# Patient Record
Sex: Male | Born: 1990 | Race: Black or African American | Hispanic: No | Smoking: Current every day smoker
Health system: Southern US, Community
[De-identification: ages and names within clinical notes are randomized; demographics above are authoritative.]

---

## 2021-01-18 ENCOUNTER — Encounter (HOSPITAL_COMMUNITY): Payer: Self-pay | Admitting: *Deleted

## 2021-01-18 ENCOUNTER — Other Ambulatory Visit: Payer: Self-pay

## 2021-01-18 ENCOUNTER — Emergency Department (HOSPITAL_COMMUNITY)
Admission: EM | Admit: 2021-01-18 | Discharge: 2021-01-21 | Disposition: A | Discharge status: Other Acute Inpatient Hospital | Payer: Self-pay | Attending: Emergency Medicine | Admitting: Emergency Medicine

## 2021-01-18 ENCOUNTER — Ambulatory Visit (HOSPITAL_COMMUNITY)
Admission: EM | Admit: 2021-01-18 | Discharge: 2021-01-18 | Disposition: A | Discharge status: Other Acute Inpatient Hospital | Payer: No Payment, Other | Attending: Psychiatry | Admitting: Psychiatry

## 2021-01-18 DIAGNOSIS — Z20822 Contact with and (suspected) exposure to covid-19: Secondary | ICD-10-CM | POA: Insufficient documentation

## 2021-01-18 DIAGNOSIS — Z79899 Other long term (current) drug therapy: Secondary | ICD-10-CM | POA: Insufficient documentation

## 2021-01-18 DIAGNOSIS — F1914 Other psychoactive substance abuse with psychoactive substance-induced mood disorder: Secondary | ICD-10-CM | POA: Insufficient documentation

## 2021-01-18 DIAGNOSIS — R45851 Suicidal ideations: Secondary | ICD-10-CM

## 2021-01-18 DIAGNOSIS — R440 Auditory hallucinations: Secondary | ICD-10-CM | POA: Insufficient documentation

## 2021-01-18 DIAGNOSIS — F1910 Other psychoactive substance abuse, uncomplicated: Secondary | ICD-10-CM

## 2021-01-18 DIAGNOSIS — Z62810 Personal history of physical and sexual abuse in childhood: Secondary | ICD-10-CM | POA: Insufficient documentation

## 2021-01-18 DIAGNOSIS — F1099 Alcohol use, unspecified with unspecified alcohol-induced disorder: Secondary | ICD-10-CM | POA: Insufficient documentation

## 2021-01-18 DIAGNOSIS — Z21 Asymptomatic human immunodeficiency virus [HIV] infection status: Secondary | ICD-10-CM | POA: Insufficient documentation

## 2021-01-18 DIAGNOSIS — F1290 Cannabis use, unspecified, uncomplicated: Secondary | ICD-10-CM | POA: Insufficient documentation

## 2021-01-18 DIAGNOSIS — F1994 Other psychoactive substance use, unspecified with psychoactive substance-induced mood disorder: Secondary | ICD-10-CM

## 2021-01-18 DIAGNOSIS — F4310 Post-traumatic stress disorder, unspecified: Secondary | ICD-10-CM | POA: Insufficient documentation

## 2021-01-18 DIAGNOSIS — F17210 Nicotine dependence, cigarettes, uncomplicated: Secondary | ICD-10-CM | POA: Insufficient documentation

## 2021-01-18 DIAGNOSIS — F29 Unspecified psychosis not due to a substance or known physiological condition: Secondary | ICD-10-CM | POA: Insufficient documentation

## 2021-01-18 LAB — LIPID PANEL
Cholesterol: 154 mg/dL (ref 0–200)
HDL: 70 mg/dL (ref >40)
LDL Cholesterol: 69 mg/dL (ref 0–99)
Total CHOL/HDL Ratio: 2.2 RATIO
Triglycerides: 77 mg/dL (ref <150)
VLDL: 15 mg/dL (ref 0–40)

## 2021-01-18 LAB — COMPREHENSIVE METABOLIC PANEL
ALT: 40 U/L (ref 0–44)
ALT: 39 U/L (ref 0–44)
AST: 54 U/L — ABNORMAL HIGH (ref 15–41)
AST: 47 U/L — ABNORMAL HIGH (ref 15–41)
Albumin: 4.2 g/dL (ref 3.5–5.0)
Albumin: 4.1 g/dL (ref 3.5–5.0)
Alkaline Phosphatase: 89 U/L (ref 38–126)
Alkaline Phosphatase: 94 U/L (ref 38–126)
Anion gap: 12 (ref 5–15)
Anion gap: 8 (ref 5–15)
BUN: 13 mg/dL (ref 6–20)
BUN: 13 mg/dL (ref 6–20)
CO2: 25 mmol/L (ref 22–32)
CO2: 28 mmol/L (ref 22–32)
Calcium: 9.5 mg/dL (ref 8.9–10.3)
Calcium: 9.8 mg/dL (ref 8.9–10.3)
Chloride: 99 mmol/L (ref 98–111)
Chloride: 100 mmol/L (ref 98–111)
Creatinine, Ser: 1.18 mg/dL (ref 0.61–1.24)
Creatinine, Ser: 1.24 mg/dL (ref 0.61–1.24)
GFR, Estimated: >60 mL/min (ref >60)
GFR, Estimated: >60 mL/min (ref >60)
Glucose, Bld: 74 mg/dL (ref 70–99)
Glucose, Bld: 109 mg/dL — ABNORMAL HIGH (ref 70–99)
Potassium: 4.1 mmol/L (ref 3.5–5.1)
Potassium: 4.2 mmol/L (ref 3.5–5.1)
Sodium: 136 mmol/L (ref 135–145)
Sodium: 136 mmol/L (ref 135–145)
Total Bilirubin: 1.8 mg/dL — ABNORMAL HIGH (ref 0.3–1.2)
Total Bilirubin: 1.7 mg/dL — ABNORMAL HIGH (ref 0.3–1.2)
Total Protein: 6.8 g/dL (ref 6.5–8.1)
Total Protein: 7.2 g/dL (ref 6.5–8.1)

## 2021-01-18 LAB — CBC WITH DIFFERENTIAL/PLATELET
Abs Immature Granulocytes: 0.04 10*3/uL (ref 0.00–0.07)
Basophils Absolute: 0.1 10*3/uL (ref 0.0–0.1)
Basophils Relative: 1 %
Eosinophils Absolute: 0.3 10*3/uL (ref 0.0–0.5)
Eosinophils Relative: 4 %
HCT: 43.6 % (ref 39.0–52.0)
Hemoglobin: 14.8 g/dL (ref 13.0–17.0)
Immature Granulocytes: 0 %
Lymphocytes Relative: 17 %
Lymphs Abs: 1.6 10*3/uL (ref 0.7–4.0)
MCH: 27.2 pg (ref 26.0–34.0)
MCHC: 33.9 g/dL (ref 30.0–36.0)
MCV: 80 fL (ref 80.0–100.0)
Monocytes Absolute: 0.9 10*3/uL (ref 0.1–1.0)
Monocytes Relative: 11 %
Neutro Abs: 6.1 10*3/uL (ref 1.7–7.7)
Neutrophils Relative %: 67 %
Platelets: 288 10*3/uL (ref 150–400)
RBC: 5.45 MIL/uL (ref 4.22–5.81)
RDW: 13.8 % (ref 11.5–15.5)
WBC: 9 10*3/uL (ref 4.0–10.5)
nRBC: 0 % (ref 0.0–0.2)

## 2021-01-18 LAB — POCT URINE DRUG SCREEN - MANUAL ENTRY (I-SCREEN)
POC Amphetamine UR: NOT DETECTED
POC Buprenorphine (BUP): NOT DETECTED
POC Cocaine UR: POSITIVE — AB
POC Marijuana UR: POSITIVE — AB
POC Methadone UR: NOT DETECTED
POC Methamphetamine UR: NOT DETECTED
POC Morphine: NOT DETECTED
POC Oxazepam (BZO): NOT DETECTED
POC Oxycodone UR: NOT DETECTED
POC Secobarbital (BAR): NOT DETECTED

## 2021-01-18 LAB — CBC
HCT: 44.8 % (ref 39.0–52.0)
Hemoglobin: 14.8 g/dL (ref 13.0–17.0)
MCH: 26.9 pg (ref 26.0–34.0)
MCHC: 33 g/dL (ref 30.0–36.0)
MCV: 81.3 fL (ref 80.0–100.0)
Platelets: 297 10*3/uL (ref 150–400)
RBC: 5.51 MIL/uL (ref 4.22–5.81)
RDW: 13.7 % (ref 11.5–15.5)
WBC: 7.3 10*3/uL (ref 4.0–10.5)
nRBC: 0 % (ref 0.0–0.2)

## 2021-01-18 LAB — URINALYSIS, ROUTINE W REFLEX MICROSCOPIC
Bacteria, UA: NONE SEEN
Bilirubin Urine: NEGATIVE
Glucose, UA: NEGATIVE mg/dL
Ketones, ur: 20 mg/dL — AB
Leukocytes,Ua: NEGATIVE
Nitrite: NEGATIVE
Protein, ur: NEGATIVE mg/dL
Specific Gravity, Urine: 1.016 (ref 1.005–1.030)
pH: 6 (ref 5.0–8.0)

## 2021-01-18 LAB — RAPID URINE DRUG SCREEN, HOSP PERFORMED
Amphetamines: NOT DETECTED
Barbiturates: NOT DETECTED
Benzodiazepines: NOT DETECTED
Cocaine: POSITIVE — AB
Opiates: NOT DETECTED
Tetrahydrocannabinol: POSITIVE — AB

## 2021-01-18 LAB — ETHANOL
Alcohol, Ethyl (B): <10 mg/dL (ref <10)
Alcohol, Ethyl (B): <10 mg/dL (ref <10)

## 2021-01-18 LAB — HEMOGLOBIN A1C
Hgb A1c MFr Bld: 5.7 % — ABNORMAL HIGH (ref 4.8–5.6)
Mean Plasma Glucose: 116.89 mg/dL

## 2021-01-18 LAB — ACETAMINOPHEN LEVEL: Acetaminophen (Tylenol), Serum: <10 ug/mL — ABNORMAL LOW (ref 10–30)

## 2021-01-18 LAB — POC SARS CORONAVIRUS 2 AG -  ED: SARS Coronavirus 2 Ag: NEGATIVE

## 2021-01-18 LAB — RESP PANEL BY RT-PCR (FLU A&B, COVID) ARPGX2
Influenza A by PCR: NEGATIVE
Influenza B by PCR: NEGATIVE
SARS Coronavirus 2 by RT PCR: NEGATIVE

## 2021-01-18 LAB — TSH: TSH: 1.371 u[IU]/mL (ref 0.350–4.500)

## 2021-01-18 LAB — SALICYLATE LEVEL: Salicylate Lvl: <7 mg/dL — ABNORMAL LOW (ref 7.0–30.0)

## 2021-01-18 MED ORDER — MELATONIN 3 MG PO TABS
3.0000 mg | ORAL_TABLET | Freq: Every day | ORAL | Status: DC
  Administered 2021-01-18 – 2021-01-20 (×3): 3 mg via ORAL
  Filled 2021-01-18 (×3): qty 1

## 2021-01-18 MED ORDER — THIAMINE HCL 100 MG/ML IJ SOLN
100.0000 mg | Freq: Every day | INTRAMUSCULAR | Status: DC
Start: 2021-01-18 — End: 2021-01-21

## 2021-01-18 MED ORDER — LORAZEPAM 1 MG PO TABS: 0.0000 mg | ORAL_TABLET | Freq: Two times a day (BID) | ORAL | Status: DC

## 2021-01-18 MED ORDER — LORAZEPAM 2 MG/ML IJ SOLN: 0.0000 mg | Freq: Two times a day (BID) | INTRAMUSCULAR | Status: DC

## 2021-01-18 MED ORDER — ONDANSETRON HCL 4 MG PO TABS: 4.0000 mg | ORAL_TABLET | Freq: Three times a day (TID) | ORAL | Status: DC | PRN

## 2021-01-18 MED ORDER — LORAZEPAM 2 MG/ML IJ SOLN: 0.0000 mg | Freq: Four times a day (QID) | INTRAMUSCULAR | Status: AC

## 2021-01-18 MED ORDER — LORAZEPAM 1 MG PO TABS: 0.0000 mg | ORAL_TABLET | Freq: Four times a day (QID) | ORAL | Status: AC

## 2021-01-18 MED ORDER — ACETAMINOPHEN 325 MG PO TABS
650.0000 mg | ORAL_TABLET | ORAL | Status: DC | PRN
  Administered 2021-01-20: 650 mg via ORAL
  Filled 2021-01-18: qty 2

## 2021-01-18 MED ORDER — BICTEGRAVIR-EMTRICITAB-TENOFOV 50-200-25 MG PO TABS
1.0000 | ORAL_TABLET | Freq: Every day | ORAL | Status: DC
  Administered 2021-01-18 – 2021-01-20 (×3): 1 via ORAL
  Filled 2021-01-18 (×5): qty 1

## 2021-01-18 MED ORDER — THIAMINE HCL 100 MG PO TABS
100.0000 mg | ORAL_TABLET | Freq: Every day | ORAL | Status: DC
  Administered 2021-01-18 – 2021-01-20 (×3): 100 mg via ORAL
  Filled 2021-01-18 (×3): qty 1

## 2021-01-18 MED ORDER — NICOTINE 21 MG/24HR TD PT24
21.0000 mg | MEDICATED_PATCH | Freq: Every day | TRANSDERMAL | Status: DC
  Filled 2021-01-18: qty 1

## 2021-01-18 MED ORDER — ALUM & MAG HYDROXIDE-SIMETH 200-200-20 MG/5ML PO SUSP: 30.0000 mL | Freq: Four times a day (QID) | ORAL | Status: DC | PRN

## 2021-01-18 NOTE — Progress Notes (Signed)
CSW contacted [facility], and [facility]. All facilities mentioned currently have no available beds.   Crissie Reese, MSW, LCSW-A, LCAS-A Phone: [phone] Disposition/TOC

## 2021-01-18 NOTE — Progress Notes (Signed)
Patient resting quietly. RR even and unlabored.  Continue to monitor for safety.
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
## 2021-01-18 NOTE — Discharge Instructions (Signed)
Transfer to [facility] health ED for medical clearance.

## 2021-01-18 NOTE — ED Triage Notes (Signed)
The pt reports that he has difficulty sleeping he is afraid to go to sleep  he has nightmares  as he has answered myquestions he does not make  eye contact with me speaks in a low voice  mumbling and has been asked to repeat himself numerus times
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
## 2021-01-18 NOTE — ED Provider Notes (Signed)
[facility] EMERGENCY DEPARTMENT Provider Note   CSN: [id number] Arrival date & time: 01/18/21  1449     History Chief Complaint  Patient presents with   Psychiatric Evaluation    Howard Rowland is a 30 y.o. male.  29 year old male transferred to the ER from behavioral health pending placement at inpatient facility.  Patient denies complaints at this time, requests his Biktarvy dose, states that he has skipped it for the past 3 or 4 days.  Reports he was forced to drink have amounts of alcohol and used cocaine, see prior psych note from earlier today.  Denies SI or HI at this time.      History reviewed. No pertinent past medical history.  There are no problems to display for this patient.   History reviewed. No pertinent surgical history.     No family history on file.  Social History   Tobacco Use   Smoking status: Every Day    Types: Cigarettes   Smokeless tobacco: Never  Substance Use Topics   Alcohol use: Yes   Drug use: Yes    Types: Cocaine    Home Medications Prior to Admission medications   Medication Sig Start Date End Date Taking? Authorizing Provider  bictegravir-emtricitabine-tenofovir AF (BIKTARVY) 50-200-25 MG TABS tablet Take 1 tablet by mouth daily. 11/28/20  Yes [provider]  mirtazapine (REMERON) 45 MG tablet Take 45 mg by mouth at bedtime. 01/15/21  Yes [provider]  pregabalin (LYRICA) 75 MG capsule Take 75 capsules by mouth in the morning and at bedtime. 08/16/20  Yes [provider]    Allergies    Gabapentin, Haldol [haloperidol], Naloxone, Prednisone, Suboxone [buprenorphine hcl-naloxone hcl], Lurasidone, and Ziprasidone  Review of Systems   Review of Systems  Constitutional:  Negative for chills and fever.  Respiratory:  Negative for shortness of breath.   Cardiovascular:  Negative for chest pain.  Gastrointestinal:  Negative for abdominal pain, nausea and vomiting.  Musculoskeletal:   Negative for arthralgias and myalgias.  Skin:  Negative for rash and wound.  Allergic/Immunologic: Negative for immunocompromised state.  Neurological:  Negative for weakness.  Psychiatric/Behavioral:  Positive for sleep disturbance. Negative for agitation, hallucinations and suicidal ideas.   All other systems reviewed and are negative.  Physical Exam Updated Vital Signs BP 124/83 (BP Location: Left Arm)   Pulse 77   Temp 98.6 F (37 C) (Oral)   Resp 16   Ht 5\' 8"  (1.727 m)   Wt 65.8 kg   SpO2 96%   BMI 22.05 kg/m   Physical Exam Vitals and nursing note reviewed.  Constitutional:      General: He is not in acute distress.    Appearance: He is well-developed. He is not diaphoretic.  HENT:     Head: Normocephalic and atraumatic.  Eyes:     Conjunctiva/sclera: Conjunctivae normal.  Cardiovascular:     Pulses: Normal pulses.     Heart sounds: Normal heart sounds.  Pulmonary:     Effort: Pulmonary effort is normal.     Breath sounds: Normal breath sounds.  Abdominal:     Palpations: Abdomen is soft.     Tenderness: There is no abdominal tenderness.  Musculoskeletal:     Cervical back: Neck supple.     Right lower leg: No edema.     Left lower leg: No edema.  Skin:    General: Skin is warm and dry.     Findings: No erythema or rash.  Neurological:  Mental Status: He is alert and oriented to person, place, and time.  Psychiatric:        Behavior: Behavior normal.    ED Results / Procedures / Treatments   Labs (all labs ordered are listed, but only abnormal results are displayed) Labs Reviewed  COMPREHENSIVE METABOLIC PANEL - Abnormal; Notable for the following components:      Result Value   Glucose, Bld 109 (*)    AST 47 (*)    Total Bilirubin 1.7 (*)    All other components within normal limits  SALICYLATE LEVEL - Abnormal; Notable for the following components:   Salicylate Lvl <7.0 (*)    All other components within normal limits  ACETAMINOPHEN LEVEL -  Abnormal; Notable for the following components:   Acetaminophen (Tylenol), Serum <10 (*)    All other components within normal limits  RAPID URINE DRUG SCREEN, HOSP PERFORMED - Abnormal; Notable for the following components:   Cocaine POSITIVE (*)    Tetrahydrocannabinol POSITIVE (*)    All other components within normal limits  ETHANOL  CBC    EKG None  Radiology No results found.  Procedures Procedures   Medications Ordered in ED Medications  LORazepam (ATIVAN) injection 0-4 mg (0 mg Intravenous Not Given 01/18/21 1856)    Or  LORazepam (ATIVAN) tablet 0-4 mg ( Oral See Alternative 01/18/21 1856)  LORazepam (ATIVAN) injection 0-4 mg (has no administration in time range)    Or  LORazepam (ATIVAN) tablet 0-4 mg (has no administration in time range)  thiamine tablet 100 mg (100 mg Oral Given 01/18/21 1856)    Or  thiamine (B-1) injection 100 mg ( Intravenous See Alternative 01/18/21 1856)  acetaminophen (TYLENOL) tablet 650 mg (has no administration in time range)  ondansetron (ZOFRAN) tablet 4 mg (has no administration in time range)  alum & mag hydroxide-simeth (MAALOX/MYLANTA) 200-200-20 MG/5ML suspension 30 mL (has no administration in time range)  nicotine (NICODERM CQ - dosed in mg/24 hours) patch 21 mg (21 mg Transdermal Not Given 01/18/21 1846)  melatonin tablet 3 mg (has no administration in time range)  bictegravir-emtricitabine-tenofovir AF (BIKTARVY) 50-200-25 MG per tablet 1 tablet (1 tablet Oral Given 01/18/21 1857)    ED Course  I have reviewed the triage vital signs and the nursing notes.  Pertinent labs & imaging results that were available during my care of the patient were reviewed by me and considered in my medical decision making (see chart for details).  Clinical Course as of 01/18/21 2127  Sat Jan 18, 2021  6724 30 year old male sent from behavioral health urgent care pending placement per their recommendation.  Patient is medically cleared.  Home  medication including Biktarvy ordered.  Discussed plan with patient who verbalizes understanding.  COVID obtained at behavioral urgent care earlier today and is negative.  CIWA ordered due to report of heavy alcohol use for the past few days however patient has not needed Ativan. [LM]    Clinical Course User Index [LM] Howard Rowland   MDM Rules/Calculators/A&P                            Final Clinical Impression(s) / ED Diagnoses Final diagnoses:  None    Rx / DC Orders ED Discharge Orders     None        Howard Rowland 01/18/21 2127    Wynetta Fines, MD 01/20/21 2251

## 2021-01-18 NOTE — ED Notes (Signed)
LOCKER #[id number] 

## 2021-01-18 NOTE — Progress Notes (Signed)
Patient ambulated independently to sally port without issue.  Discharged with safe transport to [facility] ED in stable condition; no acute distress noted.

## 2021-01-18 NOTE — ED Notes (Signed)
Given salad, crackers and 2 apple juice.

## 2021-01-18 NOTE — ED Notes (Signed)
Pt has been wanded by security. 

## 2021-01-18 NOTE — ED Notes (Signed)
Pt states that he takes 45mg  mirtazapine and 75mg  pregabalin qhs.

## 2021-01-18 NOTE — ED Notes (Signed)
Report called to Asher Muir, RN [facility].  Safe transport called.  Patient given ham and cheese sandwich and 2 apple juice.

## 2021-01-18 NOTE — Progress Notes (Signed)
Patient cooperative with provider ordered work up.  Rapid COVID negative.  Skin check completed and belongings secured.  Patient endorsed SI with a plan to walk into traffic.  Stated he had no plan or intent here.  Stated he didn't want to talk about HI.  Stated he has heard voices in the past, but none now.  Denied VH.  Patient ambulated independently to the adult unit.  Oriented to unit and offered snack and water.

## 2021-01-18 NOTE — ED Notes (Signed)
Last cocaine and alcohol yesterday  Snorted cocaine

## 2021-01-18 NOTE — Progress Notes (Signed)
Per Dr. Hazle Quant, patient meets criteria for inpatient treatment. There are no available or appropriate beds at [facility] today. CSW faxed referrals to the following facilities for review:  [facility]  Pending - No Request Sent N/A [address] [place] [place] [address] [phone] --  [facility]  Pending - No Request Sent N/A [address] [place] [address] [phone] --  [facility]  Pending - No Request Sent N/A [address] [place] [address] [phone] --  [facility]  Pending - No Request Sent N/A [address] [place] [address] [phone] --  [facility]  Pending - No Request Sent N/A [address] [place], [place] [address] [phone] --  [facility]  Pending - No Request Sent N/A [address] [place] [address] [phone] --  [facility]  Pending - No Request Sent N/A [address] [place] [address] [phone] --  [facility]  Pending - No Request Sent N/A [address] [place] [address] [phone] --  [facility]  Pending - No Request Sent N/A [address], [place] [address] [phone] --  [facility]  Pending - No Request Sent N/A [address] [place] [address] [phone] --  [facility]  Pending - No Request Sent N/A [address] [place] [address] [phone] --  [facility]  Pending - No Request Sent N/A [address] [place] [address] [phone] --  [facility]  Pending - No Request Sent N/A [address], [place] [address] [phone] --  [facility]  Pending - No Request Sent N/A [address] [place] [address] [phone] --  [facility]  Pending - No Request Sent N/A [address] [place] [address] [phone] --  [facility]  Pending - No Request Sent N/A [address] [place], [place] [address] [phone] --  [facility]  Pending - No Request Sent N/A [address], [place] [address] [phone] --  [facility]  Pending - No Request Sent N/A [address] [place] [address] [phone]     TTS will continue to seek bed placement.  Crissie Reese, MSW, LCSW-A, LCAS-A Phone: [phone] Disposition/TOC

## 2021-01-18 NOTE — ED Triage Notes (Signed)
Regular clothes removed and placed into burgundy scrubs wanded. One huge bag one smaller back pack 2 other plastic bags with personal items not  listed or inventoried here in triage.  Items placed in front and inside locker number 5 in purple

## 2021-01-18 NOTE — Progress Notes (Signed)
Patient resting quietly.  Turning self intermittently. No distress noted.  Continue to monitor for safety.

## 2021-01-19 NOTE — ED Notes (Signed)
Breakfast Ordered 

## 2021-01-20 ENCOUNTER — Encounter (HOSPITAL_COMMUNITY): Payer: Self-pay | Admitting: Registered Nurse

## 2021-01-20 MED ORDER — PREGABALIN 75 MG PO CAPS
75.0000 mg | ORAL_CAPSULE | Freq: Two times a day (BID) | ORAL | Status: DC
  Administered 2021-01-20 (×3): 75 mg via ORAL
  Filled 2021-01-20 (×3): qty 3

## 2021-01-20 MED ORDER — MIRTAZAPINE 15 MG PO TABS
45.0000 mg | ORAL_TABLET | Freq: Every day | ORAL | Status: DC
  Administered 2021-01-20: 45 mg via ORAL
  Filled 2021-01-20 (×2): qty 3

## 2021-01-20 NOTE — ED Notes (Signed)
Pt resting in bed. Ate dinner tray. Denies needs.

## 2021-01-20 NOTE — ED Notes (Signed)
Pt resting in bed watching tv. Says he takes his lyrica now instead of late at night. I rescheduled it, waiting for pharmacy. Pt denies other needs.

## 2021-01-20 NOTE — ED Notes (Signed)
Pt appears to be asleep. NAD noted. Sitter remains. 

## 2021-01-20 NOTE — ED Notes (Signed)
Pt ambulatory to restroom. Denies further needs.

## 2021-01-20 NOTE — ED Notes (Addendum)
Howard Rowland messaged me and said that pt was assessed on the 20th and his information has been sent out so they're just waiting to hear back whether he was accepted anywhere.

## 2021-01-20 NOTE — ED Notes (Signed)
Remeron was not originally part of ordered meds. But is part of daily meds outpatient. It was  added by EDP when adding Lyrica tonight at the request of the pt.  It is scheduled as bedtime med but pt had concerns about taking it this late because it makes him hungry.  We discussed scheduling it for 2100 on 8/22 so it is close to bedtime but also at snack time to help with hunger.  He agreed.  This way it can still be given with other nighttime meds within the hour window. It is currently scheduled for 2100 today 8/22.

## 2021-01-20 NOTE — ED Notes (Signed)
Pt given a bag lunch/cheese and crackers.

## 2021-01-20 NOTE — ED Notes (Signed)
Pt resting in bed comfortably .  Sitter at bedside.  

## 2021-01-20 NOTE — ED Notes (Signed)
Pt was accepted to [facility] pending discharges in the morning. Social work will contact us again once more information is provided.

## 2021-01-20 NOTE — BH Assessment (Addendum)
Notified @2335 , Per [facility] patient accepted to [facility], Room # 407-2 pending discharges in the morning. Also, new COVID results and must show negative prior to [facility] transfer. Patient's nurse [facility], RN), provided disposition updates.

## 2021-01-20 NOTE — ED Notes (Signed)
Pt resting in bed. NAD noted. Sitter remains.

## 2021-01-20 NOTE — BH Assessment (Addendum)
Disposition:   Shuvon Rankin, NP, requested patient to be reviewed for [facility], homeless [place] polys[place], suicidal/homicidal. However, no [facility] bed availability per Vernard Gambles, NP. @2210 , Clinician reached out to [facility]) to notify of patient's bed needs. Patient also faxed out to multiple hospitals for consideration of bed placement.   Destination Service Provider Selected Services Address Phone [facility]  N/A [address] [place], [place] [phone]  [facility]  N/A [phone] [place] [phone]  [facility]  N/A [address], [place] [address] [place] [phone]  [facility]  N/A [address] [place] [phone]  [facility]  N/A [address] [place] [phone]  [facility]  N/A [address], [place] [phone]  [facility]  N/A [address] [place] [phone]  [facility]  N/A [address], [place] [address] [place] [phone]  [facility]  N/A [address], [place] [phone]  [facility]  N/A [address] [place] [phone]

## 2021-01-20 NOTE — ED Notes (Signed)
Sleeping, arousable to voice, meds given, denies needs, sitter present.

## 2021-01-20 NOTE — ED Notes (Signed)
Sleeping

## 2021-01-20 NOTE — ED Notes (Signed)
Pt sitting up in bed now. Introduced myself. Pt AxO x4. GCS 15. Given [place] bag and sprite. Denies further needs.

## 2021-01-20 NOTE — ED Notes (Signed)
Order in for TTS of pt.

## 2021-01-20 NOTE — ED Notes (Signed)
PT has been resting quietly in room since I arrived at 1900. He declined to take Melatonin b/c he was watching a movie. He is still resting quielty.

## 2021-01-20 NOTE — ED Notes (Signed)
Pt appears to be asleep. Sitter remains. NAD noted. 

## 2021-01-20 NOTE — ED Notes (Signed)
Went to introduce myself to pt. Pt asleep. NAD noted. Sitter at bedside.

## 2021-01-21 ENCOUNTER — Other Ambulatory Visit: Payer: Self-pay

## 2021-01-21 ENCOUNTER — Inpatient Hospital Stay (HOSPITAL_COMMUNITY)
Admission: AD | Admit: 2021-01-21 | Discharge: 2021-01-27 | DRG: 885 | Disposition: A | Discharge status: Home / Self Care | Payer: Federal, State, Local not specified - Other | Attending: Emergency Medicine | Admitting: Emergency Medicine

## 2021-01-21 ENCOUNTER — Encounter (HOSPITAL_COMMUNITY): Payer: Self-pay | Admitting: Emergency Medicine

## 2021-01-21 DIAGNOSIS — F1410 Cocaine abuse, uncomplicated: Secondary | ICD-10-CM | POA: Diagnosis present

## 2021-01-21 DIAGNOSIS — R45851 Suicidal ideations: Secondary | ICD-10-CM | POA: Diagnosis present

## 2021-01-21 DIAGNOSIS — Z888 Allergy status to other drugs, medicaments and biological substances status: Secondary | ICD-10-CM | POA: Diagnosis not present

## 2021-01-21 DIAGNOSIS — F1911 Other psychoactive substance abuse, in remission: Secondary | ICD-10-CM | POA: Diagnosis present

## 2021-01-21 DIAGNOSIS — F1210 Cannabis abuse, uncomplicated: Secondary | ICD-10-CM | POA: Diagnosis present

## 2021-01-21 DIAGNOSIS — Z20822 Contact with and (suspected) exposure to covid-19: Secondary | ICD-10-CM | POA: Diagnosis present

## 2021-01-21 DIAGNOSIS — Z7289 Other problems related to lifestyle: Secondary | ICD-10-CM | POA: Diagnosis not present

## 2021-01-21 DIAGNOSIS — Z5900 Homelessness unspecified: Secondary | ICD-10-CM

## 2021-01-21 DIAGNOSIS — G4700 Insomnia, unspecified: Secondary | ICD-10-CM | POA: Diagnosis present

## 2021-01-21 DIAGNOSIS — F17210 Nicotine dependence, cigarettes, uncomplicated: Secondary | ICD-10-CM | POA: Diagnosis present

## 2021-01-21 DIAGNOSIS — F1011 Alcohol abuse, in remission: Secondary | ICD-10-CM | POA: Diagnosis present

## 2021-01-21 DIAGNOSIS — F4310 Post-traumatic stress disorder, unspecified: Secondary | ICD-10-CM | POA: Diagnosis present

## 2021-01-21 DIAGNOSIS — F333 Major depressive disorder, recurrent, severe with psychotic symptoms: Secondary | ICD-10-CM | POA: Diagnosis present

## 2021-01-21 DIAGNOSIS — Z635 Disruption of family by separation and divorce: Secondary | ICD-10-CM | POA: Diagnosis not present

## 2021-01-21 DIAGNOSIS — B20 Human immunodeficiency virus [HIV] disease: Secondary | ICD-10-CM | POA: Diagnosis present

## 2021-01-21 DIAGNOSIS — Z62810 Personal history of physical and sexual abuse in childhood: Secondary | ICD-10-CM | POA: Diagnosis present

## 2021-01-21 DIAGNOSIS — F17200 Nicotine dependence, unspecified, uncomplicated: Secondary | ICD-10-CM

## 2021-01-21 LAB — RESP PANEL BY RT-PCR (FLU A&B, COVID) ARPGX2
Influenza A by PCR: NEGATIVE
Influenza B by PCR: NEGATIVE
SARS Coronavirus 2 by RT PCR: NEGATIVE

## 2021-01-21 MED ORDER — ACETAMINOPHEN 325 MG PO TABS: 650.0000 mg | ORAL_TABLET | Freq: Four times a day (QID) | ORAL | Status: DC | PRN

## 2021-01-21 MED ORDER — ADULT MULTIVITAMIN W/MINERALS CH
1.0000 | ORAL_TABLET | Freq: Every day | ORAL | Status: DC
  Administered 2021-01-21 – 2021-01-26 (×6): 1 via ORAL
  Filled 2021-01-21 (×9): qty 1

## 2021-01-21 MED ORDER — PREGABALIN 75 MG PO CAPS
75.0000 mg | ORAL_CAPSULE | Freq: Two times a day (BID) | ORAL | Status: DC
  Administered 2021-01-21 – 2021-01-24 (×6): 75 mg via ORAL
  Filled 2021-01-21 (×6): qty 1

## 2021-01-21 MED ORDER — HYDROXYZINE HCL 25 MG PO TABS
25.0000 mg | ORAL_TABLET | Freq: Four times a day (QID) | ORAL | Status: AC | PRN
Start: 2021-01-21 — End: 2021-01-24
  Filled 2021-01-21: qty 1

## 2021-01-21 MED ORDER — BICTEGRAVIR-EMTRICITAB-TENOFOV 50-200-25 MG PO TABS
1.0000 | ORAL_TABLET | Freq: Every day | ORAL | Status: DC
  Administered 2021-01-21 – 2021-01-27 (×7): 1 via ORAL
  Filled 2021-01-21 (×10): qty 1

## 2021-01-21 MED ORDER — ALUM & MAG HYDROXIDE-SIMETH 200-200-20 MG/5ML PO SUSP: 30.0000 mL | ORAL | Status: DC | PRN

## 2021-01-21 MED ORDER — MIRTAZAPINE 15 MG PO TABS
45.0000 mg | ORAL_TABLET | Freq: Every day | ORAL | Status: DC
  Filled 2021-01-21 (×2): qty 3

## 2021-01-21 MED ORDER — DIPHENHYDRAMINE HCL 25 MG PO CAPS: 25.0000 mg | ORAL_CAPSULE | Freq: Every evening | ORAL | Status: DC | PRN

## 2021-01-21 MED ORDER — ONDANSETRON 4 MG PO TBDP: 4.0000 mg | ORAL_TABLET | Freq: Four times a day (QID) | ORAL | Status: AC | PRN

## 2021-01-21 MED ORDER — LOPERAMIDE HCL 2 MG PO CAPS: 2.0000 mg | ORAL_CAPSULE | ORAL | Status: AC | PRN

## 2021-01-21 MED ORDER — THIAMINE HCL 100 MG PO TABS
100.0000 mg | ORAL_TABLET | Freq: Every day | ORAL | Status: DC
  Administered 2021-01-22 – 2021-01-27 (×7): 100 mg via ORAL
  Filled 2021-01-21 (×10): qty 1

## 2021-01-21 MED ORDER — LORAZEPAM 1 MG PO TABS
1.0000 mg | ORAL_TABLET | Freq: Four times a day (QID) | ORAL | Status: AC | PRN
Start: 2021-01-21 — End: 2021-01-24

## 2021-01-21 MED ORDER — MAGNESIUM HYDROXIDE 400 MG/5ML PO SUSP: 30.0000 mL | Freq: Every day | ORAL | Status: DC | PRN

## 2021-01-21 MED ORDER — PREGABALIN 75 MG PO CAPS
75.0000 mg | ORAL_CAPSULE | Freq: Once | ORAL | Status: AC
  Administered 2021-01-21: 75 mg via ORAL
  Filled 2021-01-21: qty 1

## 2021-01-21 MED ORDER — MIRTAZAPINE 45 MG PO TABS
45.0000 mg | ORAL_TABLET | ORAL | Status: DC
  Administered 2021-01-22: 45 mg via ORAL
  Filled 2021-01-21 (×3): qty 1

## 2021-01-21 MED ORDER — PREGABALIN 75 MG PO CAPS: 75.0000 mg | ORAL_CAPSULE | Freq: Two times a day (BID) | ORAL | Status: DC

## 2021-01-21 MED ORDER — TRAZODONE HCL 50 MG PO TABS: 50.0000 mg | ORAL_TABLET | Freq: Every evening | ORAL | Status: DC | PRN

## 2021-01-21 NOTE — ED Notes (Signed)
Pt appears to be asleep. NAD noted. Sitter remains. 

## 2021-01-21 NOTE — ED Notes (Signed)
Pt appears to be asleep. NAD noted. 
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
## 2021-01-21 NOTE — ED Notes (Signed)
Pt continues to sleep.

## 2021-01-21 NOTE — [facility] Counselor (Signed)
[facility] LCSW Note  01/21/2021   2:25PM  Type of Contact and Topic:  PSA Attempt  CSW made efforts to complete PSA w/ pt. Pt requested PSA be completed tomorrow due to currently sleeping. CSW will make continued efforts to complete PSA.    Leisa Lenz, LCSW 01/21/2021  2:31 PM

## 2021-01-21 NOTE — Progress Notes (Signed)
Adult Psychoeducational Group Note  Date:  01/21/2021 Time:  10:11 PM  Group Topic/Focus:  Wrap-Up Group:   The focus of this group is to help patients review their daily goal of treatment and discuss progress on daily workbooks.  Participation Level:  Active  Participation Quality:  Appropriate and Attentive  Affect:  Appropriate  Cognitive:  Alert and Appropriate  Insight: Appropriate and Good  Engagement in Group:  Engaged  Modes of Intervention:  Discussion  Additional Comments: Review patient's goals, progress, and objectives on this date and examine group skills pertaining to treatment. Discuss symptoms, supportive counseling, identification, and exploration of emotions. In addition, we discussed substituting negative/maladaptive thoughts or images with positive/adaptive ones. Pt self-reporting has an overall day of 7.5 out of 10 on this date. End of Wrap-Up Group progress note.   Nicoletta Dress 01/21/2021, 10:11 PM
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
## 2021-01-21 NOTE — Progress Notes (Addendum)
   01/21/21 2100  Psych Admission Type (Psych Patients Only)  Admission Status Voluntary  Psychosocial Assessment  Patient Complaints Anxiety;Depression  Eye Contact Brief  Facial Expression Anxious  Affect Anxious;Blunted  Speech Logical/coherent  Interaction Assertive  Motor Activity Other (Comment) (wnl)  Appearance/Hygiene Unremarkable  Behavior Characteristics Cooperative;Anxious  Mood Anxious;Depressed;Pleasant  Thought Process  Coherency WDL  Content WDL  Delusions None reported or observed  Perception WDL  Hallucination None reported or observed  Judgment WDL  Confusion None  Danger to Self  Current suicidal ideation? Denies  Danger to Others  Danger to Others None reported or observed   Pt seen in dayroom during evening group. Pt denies SI, HI, AVH and pain. Pt rates anxiety 9/10 and depression 5/10. Pt stays superficial in his comments. Been seen interacting with other peers. No behavior issues. Takes meds as prescribed. Denies any withdrawal symptoms.

## 2021-01-21 NOTE — ED Provider Notes (Signed)
Patient has been accepted to [facility] by Dr. Maxine Glenn, Shawna Orleans, MD 01/21/21 [phone]

## 2021-01-22 DIAGNOSIS — F1210 Cannabis abuse, uncomplicated: Secondary | ICD-10-CM | POA: Diagnosis not present

## 2021-01-22 DIAGNOSIS — F1410 Cocaine abuse, uncomplicated: Secondary | ICD-10-CM | POA: Diagnosis not present

## 2021-01-22 DIAGNOSIS — F4310 Post-traumatic stress disorder, unspecified: Secondary | ICD-10-CM | POA: Diagnosis not present

## 2021-01-22 DIAGNOSIS — F333 Major depressive disorder, recurrent, severe with psychotic symptoms: Secondary | ICD-10-CM | POA: Diagnosis not present

## 2021-01-22 MED ORDER — MIRTAZAPINE 45 MG PO TABS
45.0000 mg | ORAL_TABLET | Freq: Every day | ORAL | Status: DC
  Administered 2021-01-22: 45 mg via ORAL
  Filled 2021-01-22 (×3): qty 1

## 2021-01-22 NOTE — Progress Notes (Signed)
  D: Patient is alert and oriented x 4. Patient denies SI/HI/ AVH and pain. Disposition is Calm and cooperative with appropriate affect. Verbally contracts for safety to this Clinical research associate.   A:  Pt was given scheduled medications. Pt was encourage to attend groups. Q 15 minute checks were done for safety. Pt was visualized interacting appropriately with others in the milieu. Pt was offered support and encouragement by this Clinical research associate. Pt is goal oriented and stated goal was reached for the day. Pt attended groups and interacts well with peers and staff. Pt is taking medication. Pt complied with scheduled medications. No signs of distress nor concerns reported by patient at present.Pt remains receptive to treatment and safety maintained on unit.   R: Will continue to monitor and assess. Safety maintained during this shift.      01/22/21 2350  Psych Admission Type (Psych Patients Only)  Admission Status Voluntary  Psychosocial Assessment  Patient Complaints Anxiety  Eye Contact Brief  Facial Expression Anxious  Affect Anxious;Blunted  Speech Logical/coherent  Interaction Assertive  Appearance/Hygiene Unremarkable  Behavior Characteristics Cooperative  Mood Pleasant  Thought Process  Coherency WDL  Content WDL  Delusions None reported or observed  Perception WDL  Hallucination None reported or observed  Judgment WDL  Confusion None  Danger to Self  Current suicidal ideation? Denies  Danger to Others  Danger to Others None reported or observed

## 2021-01-22 NOTE — Progress Notes (Signed)
Adult Psychoeducational Group Note  Date:  01/22/2021 Time:  9:55 PM  Group Topic/Focus:  Wrap-Up Group:  The focus of this group is to help patients review their daily goal of treatment and discuss progress on daily workbooks.  Participation Level:  Active  Participation Quality:  Appropriate  Affect:  Appropriate  Cognitive:  Appropriate  Insight: Appropriate  Engagement in Group:  Engaged  Modes of Intervention:  Discussion  Additional Comments: The patient participated in NA Group on this date with volunteers on how to decrease dependence on relationships while beginning to meet their own needs, build confidence, and practice assertiveness. Volunteers facilitated a discussion with the group regarding triggers and identified emotional and situational factors that affect their desire to use--the end of the Wrap-Up progress note for the NA support group.       Nicoletta Dress 01/22/2021, 9:55 PM

## 2021-01-22 NOTE — Progress Notes (Signed)
Dar Note: Patient presents with a blunted affect and pleasant mood.  Denies suicidal thoughts, auditory and visual hallucinations.  Medications given as prescribed.  Routine safety checks maintained.  Patient visible in milieu interacting with staff and peers.  Patient is safe on and off the unit.
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
## 2021-01-22 NOTE — Progress Notes (Signed)
Recreation Therapy Notes  Date:  8.24.22 Time: 0930 Location: [address] Dayroom  Group Topic: Stress Management  Goal Area(s) Addresses:  Patient will identify positive stress management techniques. Patient will identify benefits of using stress management post d/c.  Behavioral Response: Attentive  Intervention: Stress Management  Activity :  Meditation.  LRT played a meditation that focused on being resilient in the face of adversity and managing reactions to situations beyond your control.  Education:  Stress Management, Discharge Planning.   Education Outcome: Acknowledges Education  Clinical Observations/Feedback: Despite interruptions, pt was engaged in group.     Caroll Rancher, LRT/CTRS         Caroll Rancher A 01/22/2021 12:20 PM

## 2021-01-22 NOTE — [facility] Group Notes (Signed)
[facility] Group Notes:  (Nursing/MHT/Case Management/Adjunct)  Date:  01/22/2021  Time:  10:36 AM  Type of Therapy:  Pts were asked to talk about their daily and long term goals   Participation Level:  Active  Participation Quality:  Attentive  Affect:  Appropriate  Cognitive:  Appropriate  Insight:  Good  Engagement in Group:  Engaged  Modes of Intervention:  Clarification  Summary of Progress/Problems: Pt stated their daily goal is to try and get his Ids brought in. Long term he would like to learn more languages. Ames Coupe 01/22/2021, 10:36 AM

## 2021-01-22 NOTE — Tx Team (Signed)
Interdisciplinary Treatment and Diagnostic Plan Update  01/22/2021 Time of Session: 9:40am  Howard Rowland MRN: [id number]  Principal Diagnosis: MDD (major depressive disorder), recurrent, severe, with psychosis ([place])  Secondary Diagnoses: Principal Problem:   MDD (major depressive disorder), recurrent, severe, with psychosis ([place]) Active Problems:   Cocaine abuse ([place])   Nondependent alcohol abuse, in remission   Tobacco use disorder   Post traumatic stress disorder (PTSD)   Marijuana abuse   Current Medications:  Current Facility-Administered Medications  Medication Dose Route Frequency Provider Last Rate Last Admin   acetaminophen (TYLENOL) tablet 650 mg  650 mg Oral Q6H PRN Prescilla Sours, PA-C       alum & mag hydroxide-simeth (MAALOX/MYLANTA) 200-200-20 MG/5ML suspension 30 mL  30 mL Oral Q4H PRN Prescilla Sours, PA-C       bictegravir-emtricitabine-tenofovir AF (BIKTARVY) 50-200-25 MG per tablet 1 tablet  1 tablet Oral Daily Margorie John W, PA-C   1 tablet at 01/22/21 3825   diphenhydrAMINE (BENADRYL) capsule 25 mg  25 mg Oral QHS PRN,MR X 1 Lavella Hammock, MD       hydrOXYzine (ATARAX/VISTARIL) tablet 25 mg  25 mg Oral Q6H PRN Margorie John W, PA-C       loperamide (IMODIUM) capsule 2-4 mg  2-4 mg Oral PRN Margorie John W, PA-C       LORazepam (ATIVAN) tablet 1 mg  1 mg Oral Q6H PRN Margorie John W, PA-C       magnesium hydroxide (MILK OF MAGNESIA) suspension 30 mL  30 mL Oral Daily PRN Margorie John W, PA-C       mirtazapine (REMERON) tablet 45 mg  45 mg Oral Doroteo Bradford, MD   45 mg at 01/22/21 0539   multivitamin with minerals tablet 1 tablet  1 tablet Oral Daily Prescilla Sours, PA-C   1 tablet at 01/21/21 1316   ondansetron (ZOFRAN-ODT) disintegrating tablet 4 mg  4 mg Oral Q6H PRN Margorie John W, PA-C       pregabalin (LYRICA) capsule 75 mg  75 mg Oral BID Singleton, Amy E, MD   75 mg at 01/22/21 7673   thiamine tablet 100 mg  100 mg Oral Daily Margorie John W, PA-C    100 mg at 01/22/21 4193   PTA Medications: Medications Prior to Admission  Medication Sig Dispense Refill Last Dose   bictegravir-emtricitabine-tenofovir AF (BIKTARVY) 50-200-25 MG TABS tablet Take 1 tablet by mouth daily.      mirtazapine (REMERON) 45 MG tablet Take 45 mg by mouth at bedtime.      pregabalin (LYRICA) 75 MG capsule Take 75 capsules by mouth in the morning and at bedtime.       Patient Stressors:    Patient Strengths:    Treatment Modalities: Medication Management, Group therapy, Case management,  1 to 1 session with clinician, Psychoeducation, Recreational therapy.   Physician Treatment Plan for Primary Diagnosis: MDD (major depressive disorder), recurrent, severe, with psychosis ([place]) Long Term Goal(s): Improvement in symptoms so as ready for discharge   Short Term Goals: Ability to identify changes in lifestyle to reduce recurrence of condition will improve Ability to verbalize feelings will improve Ability to disclose and discuss suicidal ideas Ability to demonstrate self-control will improve Ability to identify and develop effective coping behaviors will improve Ability to maintain clinical measurements within normal limits will improve Ability to identify triggers associated with substance abuse/mental health issues will improve  Medication Management: Evaluate patient's response, side effects, and tolerance  of medication regimen.  Therapeutic Interventions: 1 to 1 sessions, Unit Group sessions and Medication administration.  Evaluation of Outcomes: Not Met  Physician Treatment Plan for Secondary Diagnosis: Principal Problem:   MDD (major depressive disorder), recurrent, severe, with psychosis ([place]) Active Problems:   Cocaine abuse ([place])   Nondependent alcohol abuse, in remission   Tobacco use disorder   Post traumatic stress disorder (PTSD)   Marijuana abuse  Long Term Goal(s): Improvement in symptoms so as ready for discharge   Short Term Goals:  Ability to identify changes in lifestyle to reduce recurrence of condition will improve Ability to verbalize feelings will improve Ability to disclose and discuss suicidal ideas Ability to demonstrate self-control will improve Ability to identify and develop effective coping behaviors will improve Ability to maintain clinical measurements within normal limits will improve Ability to identify triggers associated with substance abuse/mental health issues will improve     Medication Management: Evaluate patient's response, side effects, and tolerance of medication regimen.  Therapeutic Interventions: 1 to 1 sessions, Unit Group sessions and Medication administration.  Evaluation of Outcomes: Not Met   RN Treatment Plan for Primary Diagnosis: MDD (major depressive disorder), recurrent, severe, with psychosis ([place]) Long Term Goal(s): Knowledge of disease and therapeutic regimen to maintain health will improve  Short Term Goals: Ability to remain free from injury will improve, Ability to participate in decision making will improve, Ability to verbalize feelings will improve, Ability to disclose and discuss suicidal ideas, and Ability to identify and develop effective coping behaviors will improve  Medication Management: RN will administer medications as ordered by provider, will assess and evaluate patient's response and provide education to patient for prescribed medication. RN will report any adverse and/or side effects to prescribing provider.  Therapeutic Interventions: 1 on 1 counseling sessions, Psychoeducation, Medication administration, Evaluate responses to treatment, Monitor vital signs and CBGs as ordered, Perform/monitor CIWA, COWS, AIMS and Fall Risk screenings as ordered, Perform wound care treatments as ordered.  Evaluation of Outcomes: Not Met   LCSW Treatment Plan for Primary Diagnosis: MDD (major depressive disorder), recurrent, severe, with psychosis ([place]) Long Term Goal(s):  Safe transition to appropriate next level of care at discharge, Engage patient in therapeutic group addressing interpersonal concerns.  Short Term Goals: Engage patient in aftercare planning with referrals and resources, Increase social support, Increase emotional regulation, Facilitate acceptance of mental health diagnosis and concerns, Facilitate patient progression through stages of change regarding substance use diagnoses and concerns, Identify triggers associated with mental health/substance abuse issues, and Increase skills for wellness and recovery  Therapeutic Interventions: Assess for all discharge needs, 1 to 1 time with Social worker, Explore available resources and support systems, Assess for adequacy in community support network, Educate family and significant other(s) on suicide prevention, Complete Psychosocial Assessment, Interpersonal group therapy.  Evaluation of Outcomes: Not Met   Progress in Treatment: Attending groups: Yes. Participating in groups: Yes. Taking medication as prescribed: Yes. Toleration medication: Yes. Family/Significant other contact made: Yes, individual(s) contacted:  If consents are provided  Patient understands diagnosis: No. Discussing patient identified problems/goals with staff: Yes. Medical problems stabilized or resolved: Yes. Denies suicidal/homicidal ideation: Yes. Issues/concerns per patient self-inventory: No. None   New problem(s) identified: No, Describe:  None   New Short Term/Long Term Goal(s): medication stabilization, elimination of SI thoughts, development of comprehensive mental wellness plan.   Patient Goals: "[facility] a [facility] that is [facility] to this area"   Discharge Plan or Barriers: Patient recently admitted. CSW will  continue to follow and assess for appropriate referrals and possible discharge planning.   Reason for Continuation of Hospitalization: Depression Medication stabilization Suicidal ideation Withdrawal  symptoms  Estimated Length of Stay: 3 to 5 days   Attendees: Patient: Judith Campillo  01/22/2021   Physician: Lestine Mount, DO 01/22/2021   Nursing:  01/22/2021   RN Care Manager: 01/22/2021   Social Worker: Verdis Frederickson, [place] 01/22/2021   Recreational Therapist:  01/22/2021  Other:  01/22/2021   Other:  01/22/2021   Other: 01/22/2021     Scribe for Treatment Team: Darleen Crocker, LCSWA 01/22/2021 1:40 PM

## 2021-01-22 NOTE — [facility] Group Notes (Signed)
Type of Therapy and Topic:  Group Therapy - Healthy vs Unhealthy Coping Skills  Participation Level:  Active   Description of Group The focus of this group was to determine what unhealthy coping techniques typically are used by group members and what healthy coping techniques would be helpful in coping with various problems. Patients were guided in becoming aware of the differences between healthy and unhealthy coping techniques. Patients were asked to identify 2-3 healthy coping skills they would like to learn to use more effectively.  Therapeutic Goals 1. Patients learned that coping is what human beings do all day long to deal with various situations in their lives 2. Patients defined and discussed healthy vs unhealthy coping techniques 3. Patients identified their preferred coping techniques and identified whether these were healthy or unhealthy 4. Patients determined 2-3 healthy coping skills they would like to become more familiar with and use more often. 5. Patients provided support and ideas to each other   Summary of Patient Progress:  Pt was provided with a worksheet to complete and share with the group.  Pt accepted this worksheet and was appropriate.  Pt was encouraged to speak with the social worker if they had any questions or concerns.  

## 2021-01-23 DIAGNOSIS — F1210 Cannabis abuse, uncomplicated: Secondary | ICD-10-CM | POA: Diagnosis not present

## 2021-01-23 DIAGNOSIS — F333 Major depressive disorder, recurrent, severe with psychotic symptoms: Secondary | ICD-10-CM | POA: Diagnosis not present

## 2021-01-23 DIAGNOSIS — F1410 Cocaine abuse, uncomplicated: Secondary | ICD-10-CM | POA: Diagnosis not present

## 2021-01-23 DIAGNOSIS — F4310 Post-traumatic stress disorder, unspecified: Secondary | ICD-10-CM | POA: Diagnosis not present

## 2021-01-23 MED ORDER — MIRTAZAPINE 30 MG PO TABS
30.0000 mg | ORAL_TABLET | Freq: Every day | ORAL | Status: DC
  Administered 2021-01-23 – 2021-01-26 (×4): 30 mg via ORAL
  Filled 2021-01-23 (×6): qty 1

## 2021-01-23 MED ORDER — WHITE PETROLATUM EX OINT
TOPICAL_OINTMENT | CUTANEOUS | Status: AC
  Filled 2021-01-23: qty 5

## 2021-01-23 NOTE — Plan of Care (Signed)
  Problem: Education: Goal: Knowledge of [facility] Education information/materials will improve Outcome: Progressing Goal: Emotional status will improve Outcome: Progressing Goal: Mental status will improve Outcome: Progressing   

## 2021-01-23 NOTE — Progress Notes (Signed)
Progress note  Pt found in bed; compliant with medication administration. Pt denies any physical complaints or pain. Pt is pleasant. Pt can seem preoccupied at times. Pt denies si/hi/ah/vh and verbally agrees to approach staff if these become apparent or before harming themselves/others while at [facility].  A: Pt provided support and encouragement. Pt given medication per protocol and standing orders. Q41m safety checks implemented and continued.  R: Pt safe on the unit. Will continue to monitor.

## 2021-01-23 NOTE — Progress Notes (Signed)
Pt didn't attend group. 

## 2021-01-23 NOTE — Progress Notes (Signed)
[facility] Group Notes:  (Nursing/MHT/Case Management/Adjunct)  Date:  01/23/2021  Time:  2000  Type of Therapy:   wrap up group  Participation Level:  Active  Participation Quality:  Appropriate, Attentive, Sharing, and Supportive  Affect:  Appropriate  Cognitive:  Appropriate  Insight:  Improving  Engagement in Group:  Engaged  Modes of Intervention:  Clarification, Education, and Support  Summary of Progress/Problems: Positive thinking and positive change were discussed.   Howard Rowland 01/23/2021, 8:44 PM
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
## 2021-01-23 NOTE — [facility] Suicide Risk Assessment (Signed)
[facility] INPATIENT:  Family/Significant Other Suicide Prevention Education  Suicide Prevention Education:  Patient Refusal for Family/Significant Other Suicide Prevention Education: The patient Howard Rowland has refused to provide written consent for family/significant other to be provided Family/Significant Other Suicide Prevention Education during admission and/or prior to discharge.  Physician notified.  Aram Beecham 01/23/2021, 12:55 PM

## 2021-01-23 NOTE — Progress Notes (Signed)
Adult Psychoeducational Group Note  Date:  01/23/2021 Time:  5:42 PM  Group Topic/Focus:  Coping With Mental Health Crisis:   The purpose of this group is to help patients identify strategies for coping with mental health crisis.  Group discusses possible causes of crisis and ways to manage them effectively.  Participation Level:  Active  Participation Quality:  Appropriate  Affect:  Appropriate  Cognitive:  Alert  Insight: Appropriate  Engagement in Group:  Engaged  Modes of Intervention:  Discussion  Additional Comments:  Pt attended group and participated In discussion.  Gloristine Turrubiates R Sabrine Patchen 01/23/2021, 5:42 PM
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
## 2021-01-23 NOTE — Progress Notes (Signed)
  D:  Pt presents with depression on assessment.  Pt states, "just want to be able to go in the dayroom."  Pt reports, "I normally take 45 mg of Mirtazapine."  Pt shared that he was adopted.  Pt denies SI/HI, and verbally contracts for safety.  Pt reports AH ("voices tell what will happen next").  Pt denies VH.  A:  Labs/Vitals monitored; Medication administration; Provided reassurance.  R:  Pt remains safe with Q 15 minute safety checks on the unit.    01/23/21 2130  Psych Admission Type (Psych Patients Only)  Admission Status Voluntary  Psychosocial Assessment  Patient Complaints Depression  Eye Contact Brief  Facial Expression Anxious;Pinched  Affect Anxious;Preoccupied  Speech Logical/coherent  Interaction Assertive  Motor Activity Slow  Appearance/Hygiene Unremarkable  Behavior Characteristics Cooperative;Anxious  Mood Pleasant;Anxious  Thought Process  Coherency Concrete thinking  Content WDL  Delusions None reported or observed  Perception WDL;Hallucinations  Hallucination Auditory  Judgment Poor  Confusion None  Danger to Self  Current suicidal ideation? Denies  Danger to Others  Danger to Others None reported or observed

## 2021-01-24 MED ORDER — PREGABALIN 75 MG PO CAPS
75.0000 mg | ORAL_CAPSULE | Freq: Two times a day (BID) | ORAL | Status: DC
  Administered 2021-01-24 – 2021-01-27 (×6): 75 mg via ORAL
  Filled 2021-01-24 (×6): qty 1

## 2021-01-24 NOTE — Progress Notes (Signed)
  Pt requesting an increase in Mirtazapine to 45 mg.  Pt states, "that's what I take At home."  Day shift RN given request in report.

## 2021-01-24 NOTE — Plan of Care (Signed)
  Problem: Education: Goal: Emotional status will improve Outcome: Progressing Goal: Verbalization of understanding the information provided will improve Outcome: Progressing   Problem: Activity: Goal: Interest or engagement in activities will improve Outcome: Progressing   

## 2021-01-24 NOTE — Progress Notes (Signed)
Recreation Therapy Notes  Date: 8.26.22 Time: 0930 Location: [address] Dayroom  Group Topic: Stress Management   Goal Area(s) Addresses:  Patient will actively participate in stress management techniques presented during session.  Patient will successfully identify benefit of practicing stress management post d/c.   Intervention: Relaxation exercise with ambient sound and script   Activity: Guided Imagery. LRT provided education, instruction, and demonstration on practice of visualization via guided imagery. Patients was asked to participate in the technique introduced during session. Patients were given suggestions of ways to access scripts post d/c and encouraged to explore Youtube and other apps available on smartphones, tablets, and computers.  Education:  Stress Management, Discharge Planning.   Education Outcome: Acknowledges education  Clinical Observations/Feedback: Patient did not attend group session.    Russ Looper, LRT/CTRS         Howard Rowland A 01/24/2021 10:59 AM 

## 2021-01-24 NOTE — [facility] Group Notes (Signed)
LCSW Aftercare Discharge Planning Group Note Type of Group and Topic: Psychoeducational Group: Discharge Planning Participation Level: Active   Description of Group:Discharge planning group reviews patient's anticipated discharge plans and assists patients to anticipate and address any barriers to wellness/recovery in the community. Suicide prevention education is reviewed with patients in group.   Therapeutic Goals   1. Patients will state their anticipated discharge plan and mental health aftercare 2. Patients will identify potential barriers to wellness in the community setting 3. Patients will engage in problem solving, solution focused discussion of ways to anticipate and address barriers to wellness/recovery   Summary of Patient Progress: Pt stated his name and said that another pt made him smile today. Pt attended group and was appropriate throughout.    Therapeutic Modalities: Motivational Interviewing   Demetry Bendickson M Sulaiman Imbert, LCSWA 
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
## 2021-01-24 NOTE — [facility] Group Notes (Signed)
[facility] Group Notes:  (Nursing/MHT/Case Management/Adjunct)  Date:  01/24/2021  Time:  9:47 AM  Type of Therapy:  Group Therapy  Participation Level:  Minimal  Participation Quality:  Appropriate  Affect:  Appropriate  Cognitive:  Appropriate  Insight:  Lacking  Engagement in Group:  Lacking  Modes of Intervention:  Discussion  Summary of Progress/Problems:Pt sat through most of the group before leaving.  Jaquita Rector 01/24/2021, 9:47 AM

## 2021-01-24 NOTE — Progress Notes (Signed)
Pt denied SI/HI.  Pt endorses Auditory hallucinations and says "I always hear voices", but they are not distressing.  RN administered medications per provider orders and assessed for needs and concerns.  Pt interacting with fellow patients on the unit.  Pt has been cooperative this shift. Pt remains safe on the unit with q 15 min room checks in place.    01/24/21 0815  Psych Admission Type (Psych Patients Only)  Admission Status Voluntary  Psychosocial Assessment  Patient Complaints Depression  Eye Contact Brief  Facial Expression Anxious  Affect Anxious;Preoccupied  Speech Logical/coherent  Interaction Assertive  Motor Activity Slow  Appearance/Hygiene Unremarkable  Behavior Characteristics Cooperative;Anxious  Mood Depressed;Pleasant  Thought Process  Coherency Concrete thinking  Content WDL  Delusions None reported or observed  Perception Hallucinations  Hallucination Auditory  Judgment Poor  Confusion None  Danger to Self  Current suicidal ideation? Denies  Danger to Others  Danger to Others None reported or observed

## 2021-01-24 NOTE — [facility] Group Notes (Signed)
Adult Psychoeducational Group Note  Date:  01/24/2021 Time:  6:45 PM  Group Topic/Focus:  Emotional Education:   The focus of this group is to discuss what feelings/emotions are, and how they are experienced.  Participation Level:  Active  Participation Quality:  Appropriate  Affect:  Appropriate  Cognitive:  Appropriate  Insight: Appropriate  Engagement in Group:  Engaged  Modes of Intervention:  Discussion  Additional Comments:   Patient attended evening Positive Emotion group and participated.   Terence Googe W Sawsan Riggio 01/24/2021, 6:45 PM
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
## 2021-01-25 NOTE — Plan of Care (Signed)
   Problem: Education: Goal: Emotional status will improve Outcome: Progressing Goal: Mental status will improve Outcome: Progressing   Problem: Coping: Goal: Ability to demonstrate self-control will improve Outcome: Progressing   

## 2021-01-25 NOTE — Progress Notes (Signed)
   01/25/21 2129  Psych Admission Type (Psych Patients Only)  Admission Status Voluntary  Psychosocial Assessment  Patient Complaints None  Eye Contact Brief  Facial Expression Flat  Affect Appropriate to circumstance  Speech Logical/coherent  Interaction Forwards little  Motor Activity Other (Comment) (WDL)  Appearance/Hygiene Unremarkable  Behavior Characteristics Appropriate to situation  Mood Pleasant  Thought Process  Coherency Concrete thinking  Content WDL  Delusions None reported or observed  Perception Hallucinations  Hallucination None reported or observed  Judgment Poor  Confusion None  Danger to Self  Current suicidal ideation? Denies  Danger to Others  Danger to Others None reported or observed

## 2021-01-25 NOTE — [facility] Group Notes (Signed)
LCSW Group Therapy Note  01/25/2021  Type of Therapy and Topic:  Group Therapy - Healthy vs Unhealthy Coping Skills  Participation Level:  Active   Description of Group The focus of this group was to determine what unhealthy coping techniques typically are used by group members and what healthy coping techniques would be helpful in coping with various problems. Patients were guided in becoming aware of the differences between healthy and unhealthy coping techniques. Patients were asked to identify 2-3 healthy coping skills they would like to learn to use more effectively, and many mentioned meditation, breathing, and relaxation. These were explained, samples demonstrated, and resources shared for how to learn more at discharge. At group closing, additional ideas of healthy coping skills were shared in a fun exercise.  Therapeutic Goals Patients learned that coping is what human beings do all day long to deal with various situations in their lives Patients defined and discussed healthy vs unhealthy coping techniques Patients identified their preferred coping techniques and identified whether these were healthy or unhealthy Patients determined 2-3 healthy coping skills they would like to become more familiar with and use more often, and practiced a few medications Patients provided support and ideas to each other   Summary of Patient Progress:  Pt was given packet of worksheets discussing coping skills. Pt was encouraged to seek out CSW if any questions arose while completing the worksheets.    Therapeutic Modalities Cognitive Behavioral Therapy Motivational Interviewing  Dominiq Fontaine, LCSWA Clinicial Social Worker [facility]   

## 2021-01-25 NOTE — [facility] Counselor (Signed)
Pt was given a folder of homeless resources that included shelters, boarding houses, low-cost housing, food pantries, GoodRx card, and crisis resources.    Alyn Jurney, LCSWA Clinicial Social Worker [facility]  

## 2021-01-25 NOTE — Progress Notes (Signed)
D: Patient observed up at nurse's station this morning. He is animated; talkative. He denies any thoughts of self harm today. Patient has been cooperative today. He states he slept well last night. Patient is pleasant with staff.   A: Continue to monitor medication management and MD orders.  Safety checks completed every 15 minutes per protocol.  Offer support and encouragement as needed.  R: Patient is receptive to staff; his behavior is appropriate.         01/25/21 1200  Psych Admission Type (Psych Patients Only)  Admission Status Voluntary  Psychosocial Assessment  Patient Complaints None  Eye Contact Brief  Facial Expression Animated  Affect Preoccupied  Speech Logical/coherent  Interaction Assertive  Motor Activity Slow  Appearance/Hygiene Unremarkable  Behavior Characteristics Cooperative  Mood Depressed  Thought Process  Coherency Circumstantial  Content WDL  Delusions None reported or observed  Perception Hallucinations  Hallucination None reported or observed  Judgment Poor  Confusion None  Danger to Self  Current suicidal ideation? Denies  Danger to Others  Danger to Others None reported or observed
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
## 2021-01-25 NOTE — Progress Notes (Signed)
     01/24/21 2200  Psych Admission Type (Psych Patients Only)  Admission Status Voluntary  Psychosocial Assessment  Patient Complaints None  Eye Contact Brief  Facial Expression Anxious  Affect Anxious;Preoccupied  Speech Logical/coherent  Interaction Assertive  Motor Activity Slow  Appearance/Hygiene Unremarkable  Behavior Characteristics Cooperative;Anxious  Mood Pleasant;Depressed  Thought Process  Coherency Concrete thinking  Content WDL  Delusions None reported or observed  Perception Hallucinations  Hallucination None reported or observed  Judgment Poor  Confusion None  Danger to Self  Current suicidal ideation? Denies  Danger to Others  Danger to Others None reported or observed

## 2021-01-25 NOTE — [facility] Group Notes (Signed)
Patient feels like an 8 out of 10 Wants to work on having more patience

## 2021-01-26 NOTE — [facility] Group Notes (Signed)
Adult Psychoeducational Group Note  Date:  01/26/2021 Time:  4:30 PM  Group Topic/Focus:  Developing a Wellness Toolbox:   The focus of this group is to help patients develop a "wellness toolbox" with skills and strategies to promote recovery upon discharge.  Participation Level:  Active  Participation Quality:  Appropriate  Affect:  Appropriate  Cognitive:  Appropriate  Insight: Appropriate  Engagement in Group:  Engaged  Modes of Intervention:  Discussion  Additional Comments:  Patient attended and participated in the relaxation group.  [name] 01/26/2021, 4:30 PM

## 2021-01-26 NOTE — [facility] Group Notes (Signed)
Adult Psychoeducational Group Note  Date:  01/26/2021 Time:  2:12 PM  Group Topic/Focus:  Developing a Wellness Toolbox:   The focus of this group is to help patients develop a "wellness toolbox" with skills and strategies to promote recovery upon discharge.  Participation Level:  Active  Participation Quality:  Attentive  Affect:  Appropriate  Cognitive:  Alert  Insight: Good  Engagement in Group:  Engaged  Modes of Intervention:  Discussion  Additional Comments:  Patient attended and participated in the Psycho-Ed group.  [name] 01/26/2021, 2:12 PM

## 2021-01-26 NOTE — [facility] Group Notes (Signed)
LCSW Group Therapy Notes    Type of Therapy and Topic: Group Therapy: Effective Communication   Participation Level: Active   Description of Group:  In this group patients will be asked to identify their own styles of communication as well as defining and identifying passive, assertive, and aggressive styles of communication. Participants will identify strategies to communicate in a more assertive manner in an effort to appropriately meet their needs. This group will be process-oriented, with patients participating in exploration of their own experiences as well as giving and receiving support and challenge from other group members.   Therapeutic Goals: 1. Patient will identify their personal communication style. 2. Patient will identify passive, assertive, and aggressive forms of communication. 3. Patient will identify strategies for developing more effective communication to appropriately meet their needs.      Summary of Patient Progress:   This group has been supplemented with worksheets. Patient was given opportunity to meet with CSW one on one to review worksheets.     Therapeutic Modalities:  Communication Skills Solution Focused Therapy Motivational Interviewing     Kashay Cavenaugh MSW, LCSW Clincal Social Worker  [facility] 

## 2021-01-26 NOTE — Progress Notes (Addendum)
D:  Patient denied SI and HI, contracts for safety.  Denied A/V hallucinations.  Denied pain. A:  Medications administered per MD orders.  Emotional support and encouragement given patient. R:  Safety maintained with 15 minutre checks.  Patient's self inventory sheet, patient has poor sleep, sleep medication not helpful.  Fair appetite, low energy level, good concentration.  Denied depression, hopeless and

## 2021-01-26 NOTE — Progress Notes (Signed)
   01/26/21 2247  Psych Admission Type (Psych Patients Only)  Admission Status Voluntary  Psychosocial Assessment  Patient Complaints Insomnia;Nervousness  Eye Contact Fair  Facial Expression Flat  Affect Appropriate to circumstance  Speech Logical/coherent  Interaction Forwards little  Motor Activity Other (Comment) (WDL)  Appearance/Hygiene Unremarkable  Behavior Characteristics Anxious  Mood Anxious  Thought Process  Coherency Concrete thinking  Content Paranoia  Delusions None reported or observed  Perception Hallucinations  Hallucination Auditory  Judgment Poor  Confusion None  Danger to Self  Current suicidal ideation? Denies  Danger to Others  Danger to Others None reported or observed    Pt initially stated he was okay with having a roommate but when it came time to go in room and sleep he could not go in the room with the roommate -became increasingly anxious.  Pt moved to [address] hall to sleep tonight, no roommate order will be obtained.  He will return to [address] in AM and when private room becomes available will be moved to that room.
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
## 2021-01-26 NOTE — BHH Group Notes (Addendum)
[facility] Group Notes:  (Nursing/MHT/Case Management/Adjunct)  Date:  01/26/2021  Time:  10:11 AM  Type of Therapy:  Group Therapy  Participation Level:  Did Not Attend  Summary of Progress/Problems:  Patient did not attend goals group.   Daneil Dan 01/26/2021, 10:11 AM

## 2021-01-26 NOTE — Plan of Care (Signed)
Nurse discussed anxiety, depression and coping skills with patient.  

## 2021-01-27 ENCOUNTER — Encounter (HOSPITAL_COMMUNITY): Payer: Self-pay

## 2021-01-27 DIAGNOSIS — F333 Major depressive disorder, recurrent, severe with psychotic symptoms: Secondary | ICD-10-CM | POA: Diagnosis not present

## 2021-01-27 MED ORDER — WHITE PETROLATUM EX OINT
TOPICAL_OINTMENT | CUTANEOUS | Status: AC
  Filled 2021-01-27: qty 5

## 2021-01-27 MED ORDER — MIRTAZAPINE 30 MG PO TABS: 30.0000 mg | ORAL_TABLET | Freq: Every day | ORAL | 0 refills | Status: DC

## 2021-01-27 NOTE — Progress Notes (Signed)
Adult Psychoeducational Group Note  Date:  01/27/2021 Time:  9:36 AM  Group Topic/Focus:  Goals Group:   The focus of this group is to help patients establish daily goals to achieve during treatment and discuss how the patient can incorporate goal setting into their daily lives to aide in recovery.  Participation Level:  Active  Participation Quality:  Appropriate  Affect:  Appropriate  Cognitive:  Alert  Insight: Appropriate  Engagement in Group:  Engaged  Modes of Intervention:  Discussion and Education  Additional Comments:  Pt attended the goals group this morning and was excited about his upcoming discharge.   Agapita Savarino E 01/27/2021, 9:36 AM

## 2021-01-27 NOTE — BHH Group Notes (Signed)
Spiritual care group on grief and loss facilitated by chaplain Dyanne Carrel, [facility]   Group Goal:   Support / Education around grief and loss   Members engage in facilitated group support and psycho-social education.   Group Description:   Following introductions and group rules, group members engaged in facilitated group dialog and support around topic of loss, with particular support around experiences of loss in their lives. Group Identified types of loss (relationships / self / things) and identified patterns, circumstances, and changes that precipitate losses. Reflected on thoughts / feelings around loss, normalized grief responses, and recognized variety in grief experience. Group noted Worden's four tasks of grief in discussion.   Group drew on Adlerian / Rogerian, narrative, MI,   Patient Progress: Howard Rowland participated in group and engaged in the conversation.  He clearly stated that he did not want to do this group and be reminded of his losses.  He was given the option to not participate, but he chose to stay.  He was an active participant in the conversation and shared with some insight.  Chaplain Dyanne Carrel, Bcc Pager, [phone] 10:36 PM

## 2021-01-27 NOTE — BHH Suicide Risk Assessment (Signed)
[facility] Discharge Suicide Risk Assessment   Principal Problem: MDD (major depressive disorder), recurrent, severe, with psychosis (HCC) Discharge Diagnoses: Principal Problem:   MDD (major depressive disorder), recurrent, severe, with psychosis (HCC) Active Problems:   Cocaine abuse (HCC)   Nondependent alcohol abuse, in remission   Tobacco use disorder   Post traumatic stress disorder (PTSD)   Marijuana abuse   Total Time spent with patient: 30 minutes  Musculoskeletal: Strength & Muscle Tone: within normal limits Gait & Station: normal Patient leans: N/A  Psychiatric Specialty Exam  Presentation  General Appearance: Appropriate for Environment; Casual; Fairly Groomed  Eye Contact:Good  Speech:Clear and Coherent; Normal Rate  Speech Volume:Normal  Handedness:Right   Mood and Affect  Mood:Euthymic  Duration of Depression Symptoms: Less than two weeks  Affect:Congruent; Appropriate   Thought Process  Thought Processes:Coherent  Descriptions of Associations:Intact  Orientation:Full (Time, Place and Person)  Thought Content:Logical  History of Schizophrenia/Schizoaffective disorder:No  Duration of Psychotic Symptoms:N/A  Hallucinations:Hallucinations: None Description of Auditory Hallucinations: NA  Ideas of Reference:None  Suicidal Thoughts:Suicidal Thoughts: No SI Active Intent and/or Plan: Without Intent; Without Plan; Without Means to Carry Out; Without Access to Means SI Passive Intent and/or Plan: Without Intent; Without Plan; Without Means to Carry Out; Without Access to Means  Homicidal Thoughts:Homicidal Thoughts: No   Sensorium  Memory:Immediate Fair; Recent Fair; Remote Fair  Judgment:Fair  Insight:Fair   Executive Functions  Concentration:Good  Attention Span:Good  Recall:Fair  Fund of Knowledge:Fair  Language:Good   Psychomotor Activity  Psychomotor Activity:Psychomotor Activity: Normal   Assets  Assets:Communication  Skills; Desire for Improvement; Resilience   Sleep  Sleep:Sleep: Good Number of Hours of Sleep: 6   Physical Exam: Physical Exam Review of Systems  Constitutional: Negative.  Negative for fever and malaise/fatigue.  HENT: Negative.  Negative for ear discharge and sore throat.   Eyes: Negative.  Negative for blurred vision, discharge and redness.  Respiratory: Negative.  Negative for cough, shortness of breath and wheezing.   Cardiovascular: Negative.  Negative for palpitations.  Gastrointestinal: Negative.  Negative for constipation, heartburn and vomiting.  Musculoskeletal: Negative.  Negative for falls and myalgias.  Neurological: Negative.  Negative for dizziness, seizures, loss of consciousness, weakness and headaches.  Endo/Heme/Allergies: Negative.  Negative for environmental allergies.  Psychiatric/Behavioral: Negative.  Negative for depression, hallucinations, memory loss, substance abuse and suicidal ideas. The patient is not nervous/anxious and does not have insomnia.   Blood pressure 124/89, pulse (!) 56, temperature 98.1 F (36.7 C), temperature source Oral, resp. rate 16, height 5\' 8"  (1.727 m), weight 67.1 kg, SpO2 100 %. Body mass index is 22.5 kg/m.  Mental Status Per Nursing Assessment::   On Admission:  Self-harm thoughts  Demographic Factors:  Male  Loss Factors: Loss of significant relationship  Historical Factors: Family history of mental illness or substance abuse and Impulsivity  Risk Reduction Factors:   Sense of responsibility to family, Positive therapeutic relationship, and Positive coping skills or problem solving skills  Continued Clinical Symptoms:  Previous Psychiatric Diagnoses and Treatments  Cognitive Features That Contribute To Risk:  None    Suicide Risk:  Minimal: No identifiable suicidal ideation.  Patients presenting with no risk factors but with morbid ruminations; may be classified as minimal risk based on the severity of the  depressive symptoms   Follow-up Information     [facility] Follow up on 01/29/2021.   Specialty: Behavioral Health Why: You have a hospital discharge/assessment appointment 01/29/21 at 2:30 pm via teleheatlh.  You also have an appointment for therapy services on 02/06/21 at 9:00 am, Virtual.  You have an appointment on 02/17/21 at 1:00 pm with your primary care provider medication management services: (This appt is held In Person)* Contact information: [address] [place] [address] [phone]                 Plan Of Care/Follow-up recommendations:  Activity:  as tolerated Diet:  regular Other:  keep follow up appointments and take medications as prescribed  Nelly Rout, MD 01/27/2021, 1:37 PM

## 2021-01-27 NOTE — Progress Notes (Signed)
Discharge Note:  Patient discharged home with friend.  Suicide prevention information given and discussed with patient who stated he understood and had no questions.  Patient denied SI and HI.  Denied A/V hallucinations.  Patient    stated he received all his belongings, clothing, toiletries, etc.  Patient stated he appreciated all assistance received from [facility] staff.  All required discharge information given.

## 2021-01-27 NOTE — BHH Group Notes (Signed)
CSW was unable to hold group today due to late discharges and acuity of the unit.   Sephora Boyar, LCSWA Clinicial Social Worker [facility] 
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
## 2021-01-27 NOTE — Plan of Care (Signed)
Nurse discussed anxiety, depression and coping skills with patient.  

## 2021-01-27 NOTE — Progress Notes (Signed)
  [facility] Adult Case Management Discharge Plan :  Will you be returning to the same living situation after discharge:  No. Personal apartment At discharge, do you have transportation home?: Yes,  sister Do you have the ability to pay for your medications: Yes,  insurance  Release of information consent forms completed and in the chart;  Patient's signature needed at discharge.  Patient to Follow up at:  Follow-up Information     [facility] Follow up on 01/29/2021.   Specialty: Behavioral Health Why: You have a hospital discharge/assessment appointment 01/29/21 at 2:30 pm via teleheatlh.  You also have an appointment for therapy services on 02/06/21 at 9:00 am, Virtual.  You have an appointment on 02/17/21 at 1:00 pm with your primary care provider medication management services: (This appt is held In Person)* Contact information: [address] [place] [address] [phone]                Next level of care provider has access to [facility] Link:no  Safety Planning and Suicide Prevention discussed: Yes,  w/ pt     Has patient been referred to the Quitline?: Patient refused referral  Patient has been referred for addiction treatment: Pt. refused referral  Chrys Racer 01/27/2021, 1:43 PM

## 2021-01-27 NOTE — Progress Notes (Signed)
D:  Patient denied SI and HI, contracts for safety.  Denied A/V hallucinations. A:  Medications administered per MD orders.  Emotional support and encouragement given. R:  Safety maintained with 15 minute checks.

## 2021-01-27 NOTE — Progress Notes (Signed)
Recreation Therapy Notes  Date: 8.29.22 Time: 0930 Location: [address] Dayroom  Group Topic: Stress Management   Goal Area(s) Addresses:  Patient will actively participate in stress management techniques presented during session.  Patient will successfully identify benefit of practicing stress management post d/c.   Behavioral Response: Appropriate  Intervention: Relaxation exercise with ambient sound and script   Activity: Guided Imagery. LRT provided education, instruction, and demonstration on practice of visualization via guided imagery. Patient was asked to participate in the technique introduced during session. Patients were given suggestions of ways to access scripts post d/c and encouraged to explore Youtube and other apps available on smartphones, tablets, and computers.  Education:  Stress Management, Discharge Planning.   Education Outcome: Acknowledges education  Clinical Observations/Feedback: Patient actively engaged in technique introduced, expressed no concerns.    Caroll Rancher, LRT/CTRS         Caroll Rancher A 01/27/2021 10:48 AM

## 2021-01-27 NOTE — Group Note (Deleted)
Occupational Therapy Group Note  Group Topic:Feelings Management  Group Date: 01/27/2021 Start Time: 1400 End Time: 1450 Facilitators: Sahmir Weatherbee, OT   Group Description:    Therapeutic Goal(s):    Participation Level: {OT BHH Participation Level:26267}   Participation Quality: {OT BHH Participation Quality:26268}   Behavior: {BHH OT Group Behavior:26269}   Speech/Thought Process: {BHH OT Speech/Thought Process:26270}   Affect/Mood: {OT BHH Affect/Mood:26271}   Insight: {OT BHH Insight:26272}   Judgement: {OT BHH Judgement:26272}   Individualization: *** was *** in their participation of group discussion/activity. *** identified  Modes of Intervention: {BHH MODES OF INTERVENTION:26273}  Patient Response to Interventions:  {BHH OT Patient Response to Interventions:26274}   Plan: Continue to engage patient in OT groups 2 - 3x/week.  01/27/2021  Tasheka Houseman, OT 

## 2021-01-27 NOTE — BH IP Treatment Plan (Signed)
Interdisciplinary Treatment and Diagnostic Plan Update  01/27/2021 Time of Session:  Asencion Loveday MRN: [id number]  Principal Diagnosis: MDD (major depressive disorder), recurrent, severe, with psychosis (HCC)  Secondary Diagnoses: Principal Problem:   MDD (major depressive disorder), recurrent, severe, with psychosis (HCC) Active Problems:   Cocaine abuse (HCC)   Nondependent alcohol abuse, in remission   Tobacco use disorder   Post traumatic stress disorder (PTSD)   Marijuana abuse   Current Medications:  Current Facility-Administered Medications  Medication Dose Route Frequency Provider Last Rate Last Admin   acetaminophen (TYLENOL) tablet 650 mg  650 mg Oral Q6H PRN Jaclyn Shaggy, PA-C       alum & mag hydroxide-simeth (MAALOX/MYLANTA) 200-200-20 MG/5ML suspension 30 mL  30 mL Oral Q4H PRN Jaclyn Shaggy, PA-C       bictegravir-emtricitabine-tenofovir AF (BIKTARVY) 50-200-25 MG per tablet 1 tablet  1 tablet Oral Daily Melbourne Abts W, PA-C   1 tablet at 01/27/21 4709   diphenhydrAMINE (BENADRYL) capsule 25 mg  25 mg Oral QHS PRN,MR X 1 Mariel Craft, MD       magnesium hydroxide (MILK OF MAGNESIA) suspension 30 mL  30 mL Oral Daily PRN Melbourne Abts W, PA-C       mirtazapine (REMERON) tablet 30 mg  30 mg Oral QHS Armandina Stammer I, NP   30 mg at 01/26/21 2130   multivitamin with minerals tablet 1 tablet  1 tablet Oral Daily Jaclyn Shaggy, PA-C   1 tablet at 01/26/21 2130   pregabalin (LYRICA) capsule 75 mg  75 mg Oral BID Armandina Stammer I, NP   75 mg at 01/27/21 0750   thiamine tablet 100 mg  100 mg Oral Daily Melbourne Abts W, PA-C   100 mg at 01/27/21 6283   white petrolatum (VASELINE) gel            PTA Medications: Medications Prior to Admission  Medication Sig Dispense Refill Last Dose   bictegravir-emtricitabine-tenofovir AF (BIKTARVY) 50-200-25 MG TABS tablet Take 1 tablet by mouth daily.      mirtazapine (REMERON) 45 MG tablet Take 45 mg by mouth at bedtime.       pregabalin (LYRICA) 75 MG capsule Take 75 capsules by mouth in the morning and at bedtime.       Patient Stressors:    Patient Strengths:    Treatment Modalities: Medication Management, Group therapy, Case management,  1 to 1 session with clinician, Psychoeducation, Recreational therapy.   Physician Treatment Plan for Primary Diagnosis: MDD (major depressive disorder), recurrent, severe, with psychosis (HCC) Long Term Goal(s): Improvement in symptoms so as ready for discharge   Short Term Goals: Ability to identify changes in lifestyle to reduce recurrence of condition will improve Ability to verbalize feelings will improve Ability to disclose and discuss suicidal ideas Ability to demonstrate self-control will improve Ability to identify and develop effective coping behaviors will improve Ability to maintain clinical measurements within normal limits will improve Ability to identify triggers associated with substance abuse/mental health issues will improve  Medication Management: Evaluate patient's response, side effects, and tolerance of medication regimen.  Therapeutic Interventions: 1 to 1 sessions, Unit Group sessions and Medication administration.  Evaluation of Outcomes: Progressing  Physician Treatment Plan for Secondary Diagnosis: Principal Problem:   MDD (major depressive disorder), recurrent, severe, with psychosis (HCC) Active Problems:   Cocaine abuse (HCC)   Nondependent alcohol abuse, in remission   Tobacco use disorder   Post traumatic stress disorder (PTSD)   Marijuana  abuse  Long Term Goal(s): Improvement in symptoms so as ready for discharge   Short Term Goals: Ability to identify changes in lifestyle to reduce recurrence of condition will improve Ability to verbalize feelings will improve Ability to disclose and discuss suicidal ideas Ability to demonstrate self-control will improve Ability to identify and develop effective coping behaviors will  improve Ability to maintain clinical measurements within normal limits will improve Ability to identify triggers associated with substance abuse/mental health issues will improve     Medication Management: Evaluate patient's response, side effects, and tolerance of medication regimen.  Therapeutic Interventions: 1 to 1 sessions, Unit Group sessions and Medication administration.  Evaluation of Outcomes: Progressing   RN Treatment Plan for Primary Diagnosis: MDD (major depressive disorder), recurrent, severe, with psychosis (HCC) Long Term Goal(s): Knowledge of disease and therapeutic regimen to maintain health will improve  Short Term Goals: Ability to remain free from injury will improve, Ability to verbalize feelings will improve, and Ability to disclose and discuss suicidal ideas  Medication Management: RN will administer medications as ordered by provider, will assess and evaluate patient's response and provide education to patient for prescribed medication. RN will report any adverse and/or side effects to prescribing provider.  Therapeutic Interventions: 1 on 1 counseling sessions, Psychoeducation, Medication administration, Evaluate responses to treatment, Monitor vital signs and CBGs as ordered, Perform/monitor CIWA, COWS, AIMS and Fall Risk screenings as ordered, Perform wound care treatments as ordered.  Evaluation of Outcomes: Progressing   LCSW Treatment Plan for Primary Diagnosis: MDD (major depressive disorder), recurrent, severe, with psychosis (HCC) Long Term Goal(s): Safe transition to appropriate next level of care at discharge, Engage patient in therapeutic group addressing interpersonal concerns.  Short Term Goals: Engage patient in aftercare planning with referrals and resources, Increase social support, and Increase ability to appropriately verbalize feelings  Therapeutic Interventions: Assess for all discharge needs, 1 to 1 time with Social worker, Explore available  resources and support systems, Assess for adequacy in community support network, Educate family and significant other(s) on suicide prevention, Complete Psychosocial Assessment, Interpersonal group therapy.  Evaluation of Outcomes: Progressing   Progress in Treatment: Attending groups: Yes. Participating in groups: Yes. Taking medication as prescribed: Yes. Toleration medication: Yes. Family/Significant other contact made: Yes, individual(s) contacted:  declined Patient understands diagnosis: Yes. Discussing patient identified problems/goals with staff: Yes. Medical problems stabilized or resolved: Yes. Denies suicidal/homicidal ideation: Yes. Issues/concerns per patient self-inventory: Yes. Other: None  New problem(s) identified: No, Describe:  None  New Short Term/Long Term Goal(s):medication stabilization, elimination of SI thoughts, development of comprehensive mental wellness plan.   Patient Goals:  "[facility] area"  Discharge Plan or Barriers: Patient recently admitted. CSW will continue to follow and assess for appropriate referrals and possible discharge planning.   Reason for Continuation of Hospitalization: Medication stabilization  Estimated Length of Stay: 3-5 days   Scribe for Treatment Team: Chrys Racer 01/27/2021 10:58 AM

## 2021-01-28 ENCOUNTER — Inpatient Hospital Stay (HOSPITAL_COMMUNITY)
Admission: EM | Admit: 2021-01-28 | Discharge: 2021-02-08 | DRG: 917 | Disposition: A | Discharge status: Home / Self Care | Payer: No Typology Code available for payment source | Attending: Internal Medicine | Admitting: Internal Medicine

## 2021-01-28 ENCOUNTER — Inpatient Hospital Stay (HOSPITAL_COMMUNITY): Payer: No Typology Code available for payment source

## 2021-01-28 ENCOUNTER — Emergency Department (HOSPITAL_COMMUNITY): Payer: No Typology Code available for payment source

## 2021-01-28 ENCOUNTER — Other Ambulatory Visit: Payer: Self-pay

## 2021-01-28 DIAGNOSIS — G928 Other toxic encephalopathy: Secondary | ICD-10-CM | POA: Diagnosis present

## 2021-01-28 DIAGNOSIS — R6521 Severe sepsis with septic shock: Secondary | ICD-10-CM | POA: Diagnosis present

## 2021-01-28 DIAGNOSIS — G40901 Epilepsy, unspecified, not intractable, with status epilepticus: Secondary | ICD-10-CM | POA: Diagnosis present

## 2021-01-28 DIAGNOSIS — R404 Transient alteration of awareness: Secondary | ICD-10-CM | POA: Diagnosis present

## 2021-01-28 DIAGNOSIS — B20 Human immunodeficiency virus [HIV] disease: Secondary | ICD-10-CM | POA: Diagnosis present

## 2021-01-28 DIAGNOSIS — J9601 Acute respiratory failure with hypoxia: Secondary | ICD-10-CM

## 2021-01-28 DIAGNOSIS — F4310 Post-traumatic stress disorder, unspecified: Secondary | ICD-10-CM | POA: Diagnosis present

## 2021-01-28 DIAGNOSIS — J9600 Acute respiratory failure, unspecified whether with hypoxia or hypercapnia: Secondary | ICD-10-CM

## 2021-01-28 DIAGNOSIS — E2740 Unspecified adrenocortical insufficiency: Secondary | ICD-10-CM | POA: Diagnosis present

## 2021-01-28 DIAGNOSIS — R579 Shock, unspecified: Secondary | ICD-10-CM | POA: Diagnosis not present

## 2021-01-28 DIAGNOSIS — M6282 Rhabdomyolysis: Secondary | ICD-10-CM

## 2021-01-28 DIAGNOSIS — Z635 Disruption of family by separation and divorce: Secondary | ICD-10-CM

## 2021-01-28 DIAGNOSIS — E872 Acidosis: Secondary | ICD-10-CM | POA: Diagnosis present

## 2021-01-28 DIAGNOSIS — G9349 Other encephalopathy: Secondary | ICD-10-CM | POA: Diagnosis not present

## 2021-01-28 DIAGNOSIS — F1110 Opioid abuse, uncomplicated: Secondary | ICD-10-CM | POA: Diagnosis present

## 2021-01-28 DIAGNOSIS — D6959 Other secondary thrombocytopenia: Secondary | ICD-10-CM | POA: Diagnosis present

## 2021-01-28 DIAGNOSIS — J690 Pneumonitis due to inhalation of food and vomit: Secondary | ICD-10-CM | POA: Diagnosis present

## 2021-01-28 DIAGNOSIS — R4182 Altered mental status, unspecified: Secondary | ICD-10-CM | POA: Diagnosis not present

## 2021-01-28 DIAGNOSIS — J9602 Acute respiratory failure with hypercapnia: Secondary | ICD-10-CM | POA: Diagnosis present

## 2021-01-28 DIAGNOSIS — E876 Hypokalemia: Secondary | ICD-10-CM | POA: Diagnosis not present

## 2021-01-28 DIAGNOSIS — F32A Depression, unspecified: Secondary | ICD-10-CM | POA: Diagnosis present

## 2021-01-28 DIAGNOSIS — Z79899 Other long term (current) drug therapy: Secondary | ICD-10-CM

## 2021-01-28 DIAGNOSIS — G9341 Metabolic encephalopathy: Secondary | ICD-10-CM | POA: Diagnosis not present

## 2021-01-28 DIAGNOSIS — N170 Acute kidney failure with tubular necrosis: Secondary | ICD-10-CM | POA: Diagnosis present

## 2021-01-28 DIAGNOSIS — J939 Pneumothorax, unspecified: Secondary | ICD-10-CM

## 2021-01-28 DIAGNOSIS — R1310 Dysphagia, unspecified: Secondary | ICD-10-CM | POA: Diagnosis present

## 2021-01-28 DIAGNOSIS — A419 Sepsis, unspecified organism: Secondary | ICD-10-CM | POA: Diagnosis present

## 2021-01-28 DIAGNOSIS — E875 Hyperkalemia: Secondary | ICD-10-CM | POA: Diagnosis present

## 2021-01-28 DIAGNOSIS — R570 Cardiogenic shock: Secondary | ICD-10-CM | POA: Diagnosis present

## 2021-01-28 DIAGNOSIS — Z452 Encounter for adjustment and management of vascular access device: Secondary | ICD-10-CM

## 2021-01-28 DIAGNOSIS — F17210 Nicotine dependence, cigarettes, uncomplicated: Secondary | ICD-10-CM | POA: Diagnosis present

## 2021-01-28 DIAGNOSIS — D6489 Other specified anemias: Secondary | ICD-10-CM | POA: Diagnosis present

## 2021-01-28 DIAGNOSIS — G931 Anoxic brain damage, not elsewhere classified: Secondary | ICD-10-CM | POA: Diagnosis present

## 2021-01-28 DIAGNOSIS — R339 Retention of urine, unspecified: Secondary | ICD-10-CM | POA: Diagnosis not present

## 2021-01-28 DIAGNOSIS — Z20822 Contact with and (suspected) exposure to covid-19: Secondary | ICD-10-CM | POA: Diagnosis present

## 2021-01-28 DIAGNOSIS — I38 Endocarditis, valve unspecified: Secondary | ICD-10-CM | POA: Diagnosis not present

## 2021-01-28 DIAGNOSIS — R7989 Other specified abnormal findings of blood chemistry: Secondary | ICD-10-CM | POA: Diagnosis present

## 2021-01-28 DIAGNOSIS — T40601A Poisoning by unspecified narcotics, accidental (unintentional), initial encounter: Secondary | ICD-10-CM | POA: Diagnosis present

## 2021-01-28 DIAGNOSIS — Z885 Allergy status to narcotic agent status: Secondary | ICD-10-CM

## 2021-01-28 DIAGNOSIS — I248 Other forms of acute ischemic heart disease: Secondary | ICD-10-CM | POA: Diagnosis present

## 2021-01-28 DIAGNOSIS — Z4682 Encounter for fitting and adjustment of non-vascular catheter: Secondary | ICD-10-CM

## 2021-01-28 DIAGNOSIS — R778 Other specified abnormalities of plasma proteins: Secondary | ICD-10-CM | POA: Diagnosis not present

## 2021-01-28 DIAGNOSIS — Z781 Physical restraint status: Secondary | ICD-10-CM

## 2021-01-28 DIAGNOSIS — J9690 Respiratory failure, unspecified, unspecified whether with hypoxia or hypercapnia: Secondary | ICD-10-CM | POA: Diagnosis present

## 2021-01-28 DIAGNOSIS — T402X4S Poisoning by other opioids, undetermined, sequela: Secondary | ICD-10-CM | POA: Diagnosis not present

## 2021-01-28 DIAGNOSIS — R590 Localized enlarged lymph nodes: Secondary | ICD-10-CM | POA: Diagnosis present

## 2021-01-28 DIAGNOSIS — Z888 Allergy status to other drugs, medicaments and biological substances status: Secondary | ICD-10-CM

## 2021-01-28 DIAGNOSIS — T40601S Poisoning by unspecified narcotics, accidental (unintentional), sequela: Secondary | ICD-10-CM | POA: Diagnosis not present

## 2021-01-28 LAB — HEPATITIS PANEL, ACUTE
HCV Ab: NONREACTIVE
Hep A IgM: NONREACTIVE
Hep B C IgM: NONREACTIVE
Hepatitis B Surface Ag: NONREACTIVE

## 2021-01-28 LAB — I-STAT ARTERIAL BLOOD GAS, ED
Acid-base deficit: 8 mmol/L — ABNORMAL HIGH (ref 0.0–2.0)
Acid-base deficit: 9 mmol/L — ABNORMAL HIGH (ref 0.0–2.0)
Bicarbonate: 18.7 mmol/L — ABNORMAL LOW (ref 20.0–28.0)
Bicarbonate: 19.2 mmol/L — ABNORMAL LOW (ref 20.0–28.0)
Calcium, Ion: 0.94 mmol/L — ABNORMAL LOW (ref 1.15–1.40)
Calcium, Ion: 0.96 mmol/L — ABNORMAL LOW (ref 1.15–1.40)
HCT: 39 % (ref 39.0–52.0)
HCT: 41 % (ref 39.0–52.0)
Hemoglobin: 13.3 g/dL (ref 13.0–17.0)
Hemoglobin: 13.9 g/dL (ref 13.0–17.0)
O2 Saturation: 99 %
O2 Saturation: 99 %
Patient temperature: 97.5
Patient temperature: 100.6
Potassium: 7.3 mmol/L (ref 3.5–5.1)
Potassium: 7.3 mmol/L (ref 3.5–5.1)
Sodium: 140 mmol/L (ref 135–145)
Sodium: 139 mmol/L (ref 135–145)
TCO2: 20 mmol/L — ABNORMAL LOW (ref 22–32)
TCO2: 21 mmol/L — ABNORMAL LOW (ref 22–32)
pCO2 arterial: 41.9 mmHg (ref 32.0–48.0)
pCO2 arterial: 51.9 mmHg — ABNORMAL HIGH (ref 32.0–48.0)
pH, Arterial: 7.254 — ABNORMAL LOW (ref 7.350–7.450)
pH, Arterial: 7.182 — CL (ref 7.350–7.450)
pO2, Arterial: 183 mmHg — ABNORMAL HIGH (ref 83.0–108.0)
pO2, Arterial: 151 mmHg — ABNORMAL HIGH (ref 83.0–108.0)

## 2021-01-28 LAB — CRYPTOCOCCAL ANTIGEN, CSF: Crypto Ag: NEGATIVE

## 2021-01-28 LAB — CBC WITH DIFFERENTIAL/PLATELET
Abs Immature Granulocytes: 0.56 10*3/uL — ABNORMAL HIGH (ref 0.00–0.07)
Basophils Absolute: 0.1 10*3/uL (ref 0.0–0.1)
Basophils Relative: 0 %
Eosinophils Absolute: 0 10*3/uL (ref 0.0–0.5)
Eosinophils Relative: 0 %
HCT: 42.6 % (ref 39.0–52.0)
Hemoglobin: 13.1 g/dL (ref 13.0–17.0)
Immature Granulocytes: 3 %
Lymphocytes Relative: 12 %
Lymphs Abs: 2.7 10*3/uL (ref 0.7–4.0)
MCH: 27 pg (ref 26.0–34.0)
MCHC: 30.8 g/dL (ref 30.0–36.0)
MCV: 87.7 fL (ref 80.0–100.0)
Monocytes Absolute: 2.8 10*3/uL — ABNORMAL HIGH (ref 0.1–1.0)
Monocytes Relative: 13 %
Neutro Abs: 15.6 10*3/uL — ABNORMAL HIGH (ref 1.7–7.7)
Neutrophils Relative %: 72 %
Platelets: 416 10*3/uL — ABNORMAL HIGH (ref 150–400)
RBC: 4.86 MIL/uL (ref 4.22–5.81)
RDW: 14.8 % (ref 11.5–15.5)
WBC: 21.7 10*3/uL — ABNORMAL HIGH (ref 4.0–10.5)
nRBC: 0 % (ref 0.0–0.2)

## 2021-01-28 LAB — STREP PNEUMONIAE URINARY ANTIGEN: Strep Pneumo Urinary Antigen: NEGATIVE

## 2021-01-28 LAB — COOXEMETRY PANEL
Carboxyhemoglobin: 0.8 % (ref 0.5–1.5)
Methemoglobin: 0.8 % (ref 0.0–1.5)
O2 Saturation: 56.4 %
Total hemoglobin: 13.9 g/dL (ref 12.0–16.0)

## 2021-01-28 LAB — CSF CELL COUNT WITH DIFFERENTIAL
RBC Count, CSF: 1 /mm3 — ABNORMAL HIGH
RBC Count, CSF: 99 /mm3 — ABNORMAL HIGH
Tube #: 1
Tube #: 4
WBC, CSF: 1 /mm3 (ref 0–5)
WBC, CSF: 1 /mm3 (ref 0–5)

## 2021-01-28 LAB — COMPREHENSIVE METABOLIC PANEL
ALT: 117 U/L — ABNORMAL HIGH (ref 0–44)
ALT: 106 U/L — ABNORMAL HIGH (ref 0–44)
AST: 103 U/L — ABNORMAL HIGH (ref 15–41)
AST: 129 U/L — ABNORMAL HIGH (ref 15–41)
Albumin: 3.7 g/dL (ref 3.5–5.0)
Albumin: 2.9 g/dL — ABNORMAL LOW (ref 3.5–5.0)
Alkaline Phosphatase: 71 U/L (ref 38–126)
Alkaline Phosphatase: 56 U/L (ref 38–126)
Anion gap: 10 (ref 5–15)
Anion gap: 7 (ref 5–15)
BUN: 12 mg/dL (ref 6–20)
BUN: 14 mg/dL (ref 6–20)
CO2: 24 mmol/L (ref 22–32)
CO2: 20 mmol/L — ABNORMAL LOW (ref 22–32)
Calcium: 7.2 mg/dL — ABNORMAL LOW (ref 8.9–10.3)
Calcium: 6.4 mg/dL — CL (ref 8.9–10.3)
Chloride: 110 mmol/L (ref 98–111)
Chloride: 112 mmol/L — ABNORMAL HIGH (ref 98–111)
Creatinine, Ser: 3.09 mg/dL — ABNORMAL HIGH (ref 0.61–1.24)
Creatinine, Ser: 3.08 mg/dL — ABNORMAL HIGH (ref 0.61–1.24)
GFR, Estimated: 27 mL/min — ABNORMAL LOW (ref >60)
GFR, Estimated: 27 mL/min — ABNORMAL LOW (ref >60)
Glucose, Bld: 90 mg/dL (ref 70–99)
Glucose, Bld: 139 mg/dL — ABNORMAL HIGH (ref 70–99)
Potassium: 7.3 mmol/L (ref 3.5–5.1)
Potassium: >7.5 mmol/L (ref 3.5–5.1)
Sodium: 144 mmol/L (ref 135–145)
Sodium: 139 mmol/L (ref 135–145)
Total Bilirubin: 0.7 mg/dL (ref 0.3–1.2)
Total Bilirubin: 0.9 mg/dL (ref 0.3–1.2)
Total Protein: 6.3 g/dL — ABNORMAL LOW (ref 6.5–8.1)
Total Protein: 4.8 g/dL — ABNORMAL LOW (ref 6.5–8.1)

## 2021-01-28 LAB — URINALYSIS, ROUTINE W REFLEX MICROSCOPIC
Bilirubin Urine: NEGATIVE
Glucose, UA: NEGATIVE mg/dL
Ketones, ur: NEGATIVE mg/dL
Leukocytes,Ua: NEGATIVE
Nitrite: NEGATIVE
Protein, ur: 30 mg/dL — AB
Specific Gravity, Urine: 1.017 (ref 1.005–1.030)
pH: 5 (ref 5.0–8.0)

## 2021-01-28 LAB — ACETAMINOPHEN LEVEL: Acetaminophen (Tylenol), Serum: <10 ug/mL — ABNORMAL LOW (ref 10–30)

## 2021-01-28 LAB — I-STAT CHEM 8, ED
BUN: 13 mg/dL (ref 6–20)
Calcium, Ion: 0.9 mmol/L — ABNORMAL LOW (ref 1.15–1.40)
Chloride: 108 mmol/L (ref 98–111)
Creatinine, Ser: 2.9 mg/dL — ABNORMAL HIGH (ref 0.61–1.24)
Glucose, Bld: 90 mg/dL (ref 70–99)
HCT: 44 % (ref 39.0–52.0)
Hemoglobin: 15 g/dL (ref 13.0–17.0)
Potassium: 7 mmol/L (ref 3.5–5.1)
Sodium: 142 mmol/L (ref 135–145)
TCO2: 23 mmol/L (ref 22–32)

## 2021-01-28 LAB — LACTIC ACID, PLASMA
Lactic Acid, Venous: 4.9 mmol/L (ref 0.5–1.9)
Lactic Acid, Venous: 4.1 mmol/L (ref 0.5–1.9)

## 2021-01-28 LAB — CORTISOL: Cortisol, Plasma: 31.9 ug/dL

## 2021-01-28 LAB — HIV ANTIBODY (ROUTINE TESTING W REFLEX): HIV Screen 4th Generation wRfx: REACTIVE — AB

## 2021-01-28 LAB — RAPID URINE DRUG SCREEN, HOSP PERFORMED
Amphetamines: NOT DETECTED
Barbiturates: NOT DETECTED
Benzodiazepines: NOT DETECTED
Cocaine: NOT DETECTED
Opiates: POSITIVE — AB
Tetrahydrocannabinol: NOT DETECTED

## 2021-01-28 LAB — TROPONIN I (HIGH SENSITIVITY)
Troponin I (High Sensitivity): 179 ng/L (ref <18)
Troponin I (High Sensitivity): 226 ng/L (ref <18)

## 2021-01-28 LAB — CBC
HCT: 44.8 % (ref 39.0–52.0)
Hemoglobin: 13.6 g/dL (ref 13.0–17.0)
MCH: 26.9 pg (ref 26.0–34.0)
MCHC: 30.4 g/dL (ref 30.0–36.0)
MCV: 88.5 fL (ref 80.0–100.0)
Platelets: 250 10*3/uL (ref 150–400)
RBC: 5.06 MIL/uL (ref 4.22–5.81)
RDW: 15 % (ref 11.5–15.5)
WBC: 21 10*3/uL — ABNORMAL HIGH (ref 4.0–10.5)
nRBC: 0.1 % (ref 0.0–0.2)

## 2021-01-28 LAB — CBG MONITORING, ED
Glucose-Capillary: 63 mg/dL — ABNORMAL LOW (ref 70–99)
Glucose-Capillary: 92 mg/dL (ref 70–99)

## 2021-01-28 LAB — PROTEIN AND GLUCOSE, CSF
Glucose, CSF: 100 mg/dL — ABNORMAL HIGH (ref 40–70)
Total  Protein, CSF: 14 mg/dL — ABNORMAL LOW (ref 15–45)

## 2021-01-28 LAB — CK: Total CK: 16288 U/L — ABNORMAL HIGH (ref 49–397)

## 2021-01-28 LAB — PROCALCITONIN: Procalcitonin: 3.02 ng/mL

## 2021-01-28 LAB — GLUCOSE, CAPILLARY
Glucose-Capillary: 82 mg/dL (ref 70–99)
Glucose-Capillary: 149 mg/dL — ABNORMAL HIGH (ref 70–99)

## 2021-01-28 LAB — AMMONIA: Ammonia: 75 umol/L — ABNORMAL HIGH (ref 9–35)

## 2021-01-28 LAB — LIPASE, BLOOD: Lipase: 22 U/L (ref 11–51)

## 2021-01-28 LAB — SALICYLATE LEVEL: Salicylate Lvl: <7 mg/dL — ABNORMAL LOW (ref 7.0–30.0)

## 2021-01-28 LAB — PHOSPHORUS: Phosphorus: 5.1 mg/dL — ABNORMAL HIGH (ref 2.5–4.6)

## 2021-01-28 LAB — AMYLASE: Amylase: 537 U/L — ABNORMAL HIGH (ref 28–100)

## 2021-01-28 LAB — PROTIME-INR
INR: 1.2 (ref 0.8–1.2)
Prothrombin Time: 15.2 seconds (ref 11.4–15.2)

## 2021-01-28 LAB — RESP PANEL BY RT-PCR (FLU A&B, COVID) ARPGX2
Influenza A by PCR: NEGATIVE
Influenza B by PCR: NEGATIVE
SARS Coronavirus 2 by RT PCR: NEGATIVE

## 2021-01-28 LAB — ETHANOL: Alcohol, Ethyl (B): <10 mg/dL (ref <10)

## 2021-01-28 LAB — MAGNESIUM: Magnesium: 2 mg/dL (ref 1.7–2.4)

## 2021-01-28 LAB — APTT: aPTT: 31 seconds (ref 24–36)

## 2021-01-28 MED ORDER — FENTANYL 2500MCG IN NS 250ML (10MCG/ML) PREMIX INFUSION
50.0000 ug/h | INTRAVENOUS | Status: DC
  Administered 2021-01-28: 50 ug/h via INTRAVENOUS
  Filled 2021-01-28: qty 250

## 2021-01-28 MED ORDER — MIDAZOLAM-SODIUM CHLORIDE 100-0.9 MG/100ML-% IV SOLN
0.5000 mg/h | INTRAVENOUS | Status: DC
  Administered 2021-01-28: 1 mg/h via INTRAVENOUS
  Filled 2021-01-28: qty 100

## 2021-01-28 MED ORDER — FENTANYL CITRATE (PF) 100 MCG/2ML IJ SOLN: 100.0000 ug | Freq: Once | INTRAMUSCULAR | Status: AC

## 2021-01-28 MED ORDER — SODIUM CHLORIDE 0.9% FLUSH
10.0000 mL | Freq: Two times a day (BID) | INTRAVENOUS | Status: DC
  Administered 2021-01-29: 10 mL
  Administered 2021-01-29: 20 mL
  Administered 2021-01-29: 10 mL
  Administered 2021-01-30: 30 mL
  Administered 2021-01-30 – 2021-02-01 (×4): 10 mL
  Administered 2021-02-01: 30 mL
  Administered 2021-02-02 (×2): 10 mL
  Administered 2021-02-03: 20 mL
  Administered 2021-02-04 – 2021-02-07 (×8): 10 mL

## 2021-01-28 MED ORDER — DEXTROSE 5 % IV SOLN
10.0000 mg/kg | INTRAVENOUS | Status: DC
  Administered 2021-01-29 – 2021-02-02 (×5): 670 mg via INTRAVENOUS
  Filled 2021-01-28 (×6): qty 13.4

## 2021-01-28 MED ORDER — VANCOMYCIN HCL 1500 MG/300ML IV SOLN
1500.0000 mg | Freq: Once | INTRAVENOUS | Status: DC
  Filled 2021-01-28: qty 300

## 2021-01-28 MED ORDER — PROPOFOL 1000 MG/100ML IV EMUL
INTRAVENOUS | Status: AC | PRN
  Administered 2021-01-28: 10 ug via INTRAVENOUS

## 2021-01-28 MED ORDER — VANCOMYCIN HCL 750 MG/150ML IV SOLN
750.0000 mg | INTRAVENOUS | Status: DC
  Administered 2021-01-29 – 2021-01-30 (×2): 750 mg via INTRAVENOUS
  Filled 2021-01-28 (×3): qty 150

## 2021-01-28 MED ORDER — IOHEXOL 350 MG/ML SOLN
65.0000 mL | Freq: Once | INTRAVENOUS | Status: AC | PRN
  Administered 2021-01-28: 65 mL via INTRAVENOUS

## 2021-01-28 MED ORDER — PRISMASOL BGK 0/2.5 32-2.5 MEQ/L EC SOLN
Status: DC
  Filled 2021-01-28 (×13): qty 5000

## 2021-01-28 MED ORDER — LACTATED RINGERS IV BOLUS
1000.0000 mL | Freq: Once | INTRAVENOUS | Status: AC
  Administered 2021-01-28: 1000 mL via INTRAVENOUS

## 2021-01-28 MED ORDER — DEXTROSE 50 % IV SOLN
1.0000 | Freq: Once | INTRAVENOUS | Status: AC
  Administered 2021-01-28: 50 mL via INTRAVENOUS

## 2021-01-28 MED ORDER — SODIUM CHLORIDE 0.9 % IV SOLN
2.0000 g | Freq: Two times a day (BID) | INTRAVENOUS | Status: DC
  Administered 2021-01-28 – 2021-01-29 (×2): 2 g via INTRAVENOUS
  Filled 2021-01-28 (×2): qty 2

## 2021-01-28 MED ORDER — CHLORHEXIDINE GLUCONATE 0.12% ORAL RINSE (MEDLINE KIT)
15.0000 mL | Freq: Two times a day (BID) | OROMUCOSAL | Status: DC
  Administered 2021-01-28 – 2021-02-03 (×11): 15 mL via OROMUCOSAL

## 2021-01-28 MED ORDER — EPINEPHRINE 0.1 MG/10ML (10 MCG/ML) SYRINGE FOR IV PUSH (FOR BLOOD PRESSURE SUPPORT)
PREFILLED_SYRINGE | INTRAVENOUS | Status: AC | PRN
  Administered 2021-01-28: 30 ug via INTRAVENOUS

## 2021-01-28 MED ORDER — SODIUM CHLORIDE 0.9 % IV SOLN
250.0000 [IU]/h | INTRAVENOUS | Status: DC
  Administered 2021-01-28: 500 [IU]/h via INTRAVENOUS_CENTRAL
  Filled 2021-01-28 (×2): qty 2

## 2021-01-28 MED ORDER — SODIUM CHLORIDE 0.9% FLUSH: 10.0000 mL | INTRAVENOUS | Status: DC | PRN

## 2021-01-28 MED ORDER — POLYETHYLENE GLYCOL 3350 17 G PO PACK
17.0000 g | PACK | Freq: Every day | ORAL | Status: DC | PRN
Start: 2021-01-28 — End: 2021-01-31

## 2021-01-28 MED ORDER — FUROSEMIDE 10 MG/ML IJ SOLN
40.0000 mg | Freq: Once | INTRAMUSCULAR | Status: AC
  Filled 2021-01-28: qty 4

## 2021-01-28 MED ORDER — LEVETIRACETAM IN NACL 1000 MG/100ML IV SOLN
1000.0000 mg | Freq: Once | INTRAVENOUS | Status: AC
  Administered 2021-01-28: 1000 mg via INTRAVENOUS

## 2021-01-28 MED ORDER — CALCIUM GLUCONATE-NACL 1-0.675 GM/50ML-% IV SOLN
1.0000 g | Freq: Once | INTRAVENOUS | Status: AC
  Administered 2021-01-28: 1000 mg via INTRAVENOUS
  Filled 2021-01-28: qty 50

## 2021-01-28 MED ORDER — SODIUM CHLORIDE 0.9 % IV SOLN: 2.0000 g | Freq: Two times a day (BID) | INTRAVENOUS | Status: DC

## 2021-01-28 MED ORDER — SODIUM CHLORIDE 0.9 % IV SOLN: 2.0000 g | Freq: Once | INTRAVENOUS | Status: DC

## 2021-01-28 MED ORDER — FENTANYL BOLUS VIA INFUSION
50.0000 ug | INTRAVENOUS | Status: DC | PRN
  Filled 2021-01-28: qty 100

## 2021-01-28 MED ORDER — CHLORHEXIDINE GLUCONATE CLOTH 2 % EX PADS
6.0000 | MEDICATED_PAD | Freq: Every day | CUTANEOUS | Status: DC
  Administered 2021-01-30 – 2021-02-07 (×7): 6 via TOPICAL

## 2021-01-28 MED ORDER — STERILE WATER FOR INJECTION IV SOLN
INTRAVENOUS | Status: DC
  Filled 2021-01-28 (×6): qty 150

## 2021-01-28 MED ORDER — CALCIUM CHLORIDE 10 % IV SOLN
INTRAVENOUS | Status: AC
  Administered 2021-01-28: 1000 mg
  Filled 2021-01-28: qty 10

## 2021-01-28 MED ORDER — SODIUM BICARBONATE 8.4 % IV SOLN: 50.0000 meq | Freq: Once | INTRAVENOUS | Status: AC

## 2021-01-28 MED ORDER — FENTANYL CITRATE (PF) 100 MCG/2ML IJ SOLN: 50.0000 ug | INTRAMUSCULAR | Status: DC | PRN

## 2021-01-28 MED ORDER — EPINEPHRINE HCL 5 MG/250ML IV SOLN IN NS
INTRAVENOUS | Status: AC
  Administered 2021-01-28: 4 ug/min via INTRAVENOUS
  Filled 2021-01-28: qty 250

## 2021-01-28 MED ORDER — SODIUM CHLORIDE 0.9 % IV SOLN: 1.0000 g | Freq: Once | INTRAVENOUS | Status: DC

## 2021-01-28 MED ORDER — EPINEPHRINE HCL 5 MG/250ML IV SOLN IN NS: 0.5000 ug/min | INTRAVENOUS | Status: DC

## 2021-01-28 MED ORDER — PANTOPRAZOLE SODIUM 40 MG IV SOLR: 40.0000 mg | Freq: Every day | INTRAVENOUS | Status: DC

## 2021-01-28 MED ORDER — NOREPINEPHRINE 4 MG/250ML-% IV SOLN
INTRAVENOUS | Status: AC
  Administered 2021-01-28: 10 ug/min via INTRAVENOUS
  Filled 2021-01-28: qty 250

## 2021-01-28 MED ORDER — SODIUM BICARBONATE 8.4 % IV SOLN
INTRAVENOUS | Status: AC
  Administered 2021-01-28: 50 meq via INTRAVENOUS
  Filled 2021-01-28: qty 50

## 2021-01-28 MED ORDER — ROCURONIUM BROMIDE 50 MG/5ML IV SOLN
INTRAVENOUS | Status: AC | PRN
  Administered 2021-01-28: 100 mg via INTRAVENOUS

## 2021-01-28 MED ORDER — LEVETIRACETAM IN NACL 1000 MG/100ML IV SOLN
1000.0000 mg | Freq: Two times a day (BID) | INTRAVENOUS | Status: DC
  Administered 2021-01-29 – 2021-02-08 (×21): 1000 mg via INTRAVENOUS
  Filled 2021-01-28 (×23): qty 100

## 2021-01-28 MED ORDER — CALCIUM GLUCONATE 10 % IV SOLN: 1.0000 g | Freq: Once | INTRAVENOUS | Status: DC

## 2021-01-28 MED ORDER — INSULIN ASPART 100 UNIT/ML IV SOLN
10.0000 [IU] | Freq: Once | INTRAVENOUS | Status: AC
  Administered 2021-01-28: 10 [IU] via INTRAVENOUS

## 2021-01-28 MED ORDER — FENTANYL CITRATE PF 50 MCG/ML IJ SOSY: 100.0000 ug | PREFILLED_SYRINGE | INTRAMUSCULAR | Status: DC | PRN

## 2021-01-28 MED ORDER — MIDAZOLAM HCL 2 MG/2ML IJ SOLN
INTRAMUSCULAR | Status: AC
  Filled 2021-01-28: qty 4

## 2021-01-28 MED ORDER — HYDROCORTISONE NA SUCCINATE PF 100 MG IJ SOLR
100.0000 mg | Freq: Three times a day (TID) | INTRAMUSCULAR | Status: DC
  Administered 2021-01-28 – 2021-01-30 (×6): 100 mg via INTRAVENOUS
  Filled 2021-01-28 (×6): qty 2

## 2021-01-28 MED ORDER — FENTANYL CITRATE (PF) 100 MCG/2ML IJ SOLN
50.0000 ug | INTRAMUSCULAR | Status: DC | PRN
  Administered 2021-01-28 – 2021-01-29 (×2): 100 ug via INTRAVENOUS
  Administered 2021-01-29 (×4): 200 ug via INTRAVENOUS
  Administered 2021-01-29 – 2021-01-30 (×2): 100 ug via INTRAVENOUS
  Administered 2021-01-30 – 2021-01-31 (×3): 200 ug via INTRAVENOUS
  Filled 2021-01-28 (×2): qty 4
  Filled 2021-01-28 (×3): qty 2

## 2021-01-28 MED ORDER — DEXMEDETOMIDINE HCL IN NACL 400 MCG/100ML IV SOLN
0.0000 ug/kg/h | INTRAVENOUS | Status: AC
  Administered 2021-01-28: 0.4 ug/kg/h via INTRAVENOUS
  Administered 2021-01-29: 1 ug/kg/h via INTRAVENOUS
  Administered 2021-01-29: 0.8 ug/kg/h via INTRAVENOUS
  Administered 2021-01-29: 1 ug/kg/h via INTRAVENOUS
  Administered 2021-01-29: 0.8 ug/kg/h via INTRAVENOUS
  Administered 2021-01-30 (×2): 1 ug/kg/h via INTRAVENOUS
  Administered 2021-01-30: 0.8 ug/kg/h via INTRAVENOUS
  Administered 2021-01-31: 0.5 ug/kg/h via INTRAVENOUS
  Administered 2021-01-31: 1 ug/kg/h via INTRAVENOUS
  Administered 2021-01-31: 0.8 ug/kg/h via INTRAVENOUS
  Filled 2021-01-28 (×11): qty 100

## 2021-01-28 MED ORDER — VASOPRESSIN 20 UNITS/100 ML INFUSION FOR SHOCK
0.0000 [IU]/min | INTRAVENOUS | Status: DC
  Administered 2021-01-28 – 2021-01-29 (×3): 0.03 [IU]/min via INTRAVENOUS
  Filled 2021-01-28 (×4): qty 100

## 2021-01-28 MED ORDER — NOREPINEPHRINE 16 MG/250ML-% IV SOLN
0.0000 ug/min | INTRAVENOUS | Status: DC
  Administered 2021-01-28: 40 ug/min via INTRAVENOUS
  Administered 2021-01-28: 34 ug/min via INTRAVENOUS
  Filled 2021-01-28 (×3): qty 250

## 2021-01-28 MED ORDER — SODIUM ZIRCONIUM CYCLOSILICATE 10 G PO PACK: 10.0000 g | PACK | Freq: Once | ORAL | Status: DC

## 2021-01-28 MED ORDER — CALCIUM GLUCONATE-NACL 1-0.675 GM/50ML-% IV SOLN
1.0000 g | Freq: Once | INTRAVENOUS | Status: DC
  Filled 2021-01-28: qty 50

## 2021-01-28 MED ORDER — LEVETIRACETAM IN NACL 1000 MG/100ML IV SOLN
1000.0000 mg | Freq: Once | INTRAVENOUS | Status: DC
  Filled 2021-01-28: qty 100

## 2021-01-28 MED ORDER — ETOMIDATE 2 MG/ML IV SOLN
INTRAVENOUS | Status: AC | PRN
  Administered 2021-01-28: 20 mg via INTRAVENOUS

## 2021-01-28 MED ORDER — DOCUSATE SODIUM 100 MG PO CAPS: 100.0000 mg | ORAL_CAPSULE | Freq: Two times a day (BID) | ORAL | Status: DC | PRN

## 2021-01-28 MED ORDER — NOREPINEPHRINE 4 MG/250ML-% IV SOLN
INTRAVENOUS | Status: AC | PRN
  Administered 2021-01-28: 2 ug/min via INTRAVENOUS

## 2021-01-28 MED ORDER — SODIUM CHLORIDE 0.9 % IV SOLN: 250.0000 mL | INTRAVENOUS | Status: DC

## 2021-01-28 MED ORDER — NOREPINEPHRINE 4 MG/250ML-% IV SOLN
2.0000 ug/min | INTRAVENOUS | Status: DC
  Filled 2021-01-28: qty 250

## 2021-01-28 MED ORDER — LEVETIRACETAM IN NACL 1000 MG/100ML IV SOLN: 1000.0000 mg | Freq: Once | INTRAVENOUS | Status: DC

## 2021-01-28 MED ORDER — VANCOMYCIN VARIABLE DOSE PER UNSTABLE RENAL FUNCTION (PHARMACIST DOSING): Status: DC

## 2021-01-28 MED ORDER — VANCOMYCIN HCL 1500 MG/300ML IV SOLN
1500.0000 mg | Freq: Once | INTRAVENOUS | Status: AC
  Administered 2021-01-28: 1500 mg via INTRAVENOUS
  Filled 2021-01-28: qty 300

## 2021-01-28 MED ORDER — NOREPINEPHRINE 16 MG/250ML-% IV SOLN
2.0000 ug/min | INTRAVENOUS | Status: DC
Start: 2021-01-28 — End: 2021-01-28
  Filled 2021-01-28: qty 250

## 2021-01-28 MED ORDER — FENTANYL CITRATE (PF) 100 MCG/2ML IJ SOLN
INTRAMUSCULAR | Status: AC
  Administered 2021-01-28: 100 ug via INTRAVENOUS
  Filled 2021-01-28: qty 2

## 2021-01-28 MED ORDER — HEPARIN SODIUM (PORCINE) 5000 UNIT/ML IJ SOLN: 5000.0000 [IU] | Freq: Three times a day (TID) | INTRAMUSCULAR | Status: DC

## 2021-01-28 MED ORDER — LORAZEPAM 2 MG/ML IJ SOLN
2.0000 mg | Freq: Once | INTRAMUSCULAR | Status: AC
  Administered 2021-01-28: 2 mg via INTRAVENOUS

## 2021-01-28 MED ORDER — HEPARIN SODIUM (PORCINE) 1000 UNIT/ML DIALYSIS
1000.0000 [IU] | INTRAMUSCULAR | Status: DC | PRN
  Administered 2021-01-28: 1000 [IU] via INTRAVENOUS_CENTRAL
  Administered 2021-01-29: 2800 [IU] via INTRAVENOUS_CENTRAL
  Filled 2021-01-28 (×3): qty 6

## 2021-01-28 MED ORDER — ALBUTEROL SULFATE (2.5 MG/3ML) 0.083% IN NEBU
10.0000 mg | INHALATION_SOLUTION | Freq: Once | RESPIRATORY_TRACT | Status: AC
  Administered 2021-01-28: 10 mg via RESPIRATORY_TRACT
  Filled 2021-01-28: qty 12

## 2021-01-28 MED ORDER — SODIUM CHLORIDE 0.9 % IV SOLN
3000.0000 mg | Freq: Once | INTRAVENOUS | Status: AC
  Administered 2021-01-28: 3000 mg via INTRAVENOUS
  Filled 2021-01-28 (×2): qty 30

## 2021-01-28 MED ORDER — PROPOFOL 1000 MG/100ML IV EMUL
INTRAVENOUS | Status: AC
  Filled 2021-01-28: qty 100

## 2021-01-28 MED ORDER — PANTOPRAZOLE SODIUM 40 MG IV SOLR
40.0000 mg | Freq: Two times a day (BID) | INTRAVENOUS | Status: DC
  Administered 2021-01-28 – 2021-01-30 (×5): 40 mg via INTRAVENOUS
  Filled 2021-01-28 (×5): qty 40

## 2021-01-28 MED ORDER — CHLORHEXIDINE GLUCONATE CLOTH 2 % EX PADS: 6.0000 | MEDICATED_PAD | Freq: Every day | CUTANEOUS | Status: DC

## 2021-01-28 MED ORDER — PRISMASOL BGK 0/2.5 32-2.5 MEQ/L REPLACEMENT SOLN
Status: DC
  Filled 2021-01-28 (×4): qty 5000

## 2021-01-28 MED ORDER — ORAL CARE MOUTH RINSE
15.0000 mL | OROMUCOSAL | Status: DC
  Administered 2021-01-28 – 2021-01-31 (×26): 15 mL via OROMUCOSAL

## 2021-01-28 MED ORDER — SODIUM CHLORIDE 0.9 % IV SOLN: 60.0000 mg/kg | Freq: Once | INTRAVENOUS | Status: DC

## 2021-01-28 MED ORDER — ACETAMINOPHEN 325 MG PO TABS
650.0000 mg | ORAL_TABLET | Freq: Four times a day (QID) | ORAL | Status: DC | PRN
Start: 2021-01-28 — End: 2021-01-31
  Administered 2021-01-28: 650 mg
  Filled 2021-01-28: qty 2

## 2021-01-28 MED ORDER — SODIUM CHLORIDE 0.9 % IV SOLN: INTRAVENOUS | Status: DC

## 2021-01-28 MED ORDER — HEPARIN (PORCINE) 2000 UNITS/L FOR CRRT: INTRAVENOUS_CENTRAL | Status: DC | PRN

## 2021-01-28 MED ORDER — DEXTROSE 5 % IV SOLN
10.0000 mg/kg | Freq: Two times a day (BID) | INTRAVENOUS | Status: DC
  Administered 2021-01-28: 670 mg via INTRAVENOUS
  Filled 2021-01-28 (×3): qty 13.4

## 2021-01-28 MED ORDER — DEXTROSE 50 % IV SOLN
INTRAVENOUS | Status: AC
  Administered 2021-01-28: 50 mL via INTRAVENOUS
  Filled 2021-01-28: qty 50

## 2021-01-28 MED ORDER — LACTATED RINGERS IV SOLN: INTRAVENOUS | Status: DC

## 2021-01-28 MED ORDER — EPINEPHRINE 0.1 MG/10ML (10 MCG/ML) SYRINGE FOR IV PUSH (FOR BLOOD PRESSURE SUPPORT)
5.0000 ug | PREFILLED_SYRINGE | Freq: Once | INTRAVENOUS | Status: DC | PRN
  Filled 2021-01-28: qty 10

## 2021-01-28 MED ORDER — FUROSEMIDE 10 MG/ML IJ SOLN
INTRAMUSCULAR | Status: AC
  Administered 2021-01-28: 40 mg via INTRAVENOUS
  Filled 2021-01-28: qty 4

## 2021-01-28 MED ORDER — VASOPRESSIN 20 UNIT/ML IV SOLN
0.2000 m[IU]/kg/min | INTRAVENOUS | Status: DC
  Filled 2021-01-28: qty 0.5

## 2021-01-28 MED ORDER — DEXTROSE 50 % IV SOLN: 1.0000 | Freq: Once | INTRAVENOUS | Status: AC

## 2021-01-28 NOTE — Progress Notes (Signed)
Notified Dr. Thora Lance and Alphonzo Lemmings NP of critical lactic of 4.9.

## 2021-01-28 NOTE — Procedures (Signed)
Lumbar Puncture Procedure Note  [facility] Rowland  [id number]  1991/03/18  Date:01/28/21  Time:5:14 PM   Provider Performing:Bolton Canupp Judie Petit Thora Lance   Procedure: Lumbar Puncture (94496)  Indication(s) Rule out meningitis  Consent Unable to obtain consent due to emergent nature of procedure.  Anesthesia Topical only with 1% lidocaine    Time Out Verified patient identification, verified procedure, site/side was marked, verified correct patient position, special equipment/implants available, medications/allergies/relevant history reviewed, required imaging and test results available.   Sterile Technique Maximal sterile technique including sterile barrier drape, hand hygiene, sterile gown, sterile gloves, mask, hair covering.    Procedure Description Using palpation, approximate location of L3-L4 space identified.   Lidocaine used to anesthetize skin and subcutaneous tissue overlying this area.  A 20g spinal needle was then used to access the subarachnoid space. Opening pressure: 20cm H2O. Closing pressure:Not obtained. 6 cc CSF obtained.  Complications/Tolerance None; patient tolerated the procedure well.   EBL Minimal   Specimen(s) CSF

## 2021-01-28 NOTE — Progress Notes (Signed)
Ventilator patient transported from Trauma B to CT and back without any complications. 

## 2021-01-28 NOTE — Progress Notes (Signed)
RT note. Patient vent circuit changed at this time. VT achieved, patient sat 99%. RT will continue to monitor.

## 2021-01-28 NOTE — H&P (Signed)
NAME:  [facility] Rowland, MRN:  [id number], DOB:  1990/08/03, LOS: 0 ADMISSION DATE:  01/28/2021, CONSULTATION DATE:  01/28/2021 REFERRING MD:  Dr. Karene Fry (ED), CHIEF COMPLAINT:  Unresponsive resulting in intubation    History of Present Illness:  [facility] Rowland is a 30 y.o. male with a PMH significant for MDD, PTSD, and polysubstance abuse who presented to [facility] after being found unresponsive in hotel room. Per report Rowland was found unresponsive by bystander who then administered narcan with no improvement so EMS was called. On EMS arrival Rowland was found unresponsive with decreased respirations and severe hypotension.   On ED arrival Rowland was seen with seizure like activity and obtunded mental status prompting intubation for airway protection. Vitals pertinent for tachycardia and hypotension. Lab work significant for K 7.3, creatinine 3.09, AST 103, ALT 117, HS troponin 179, WBC 21.7. CT spine and head negative. CXR with lymphadenopathy in [facility] right paratracheal region and bilateral hila. PCCM consulted for further management and admission   Pertinent  Medical History  MDD, PTSD, and polysubstance abuse  Significant Hospital Events:  8/30 admitted after being found unresponsive   Interim History / Subjective:  As above   Objective   Blood pressure 93/60, pulse (!) 108, temperature (!) 100.5 F (38.1 C), resp. rate 20, SpO2 100 %.    Vent Mode: PRVC FiO2 (%):  [100 %] 100 % Set Rate:  [20 bmp] 20 bmp Vt Set:  [540 mL] 540 mL PEEP:  [5 cmH20] 5 cmH20 Plateau Pressure:  [19 cmH20] 19 cmH20   Intake/Output Summary (Last 24 hours) at 01/28/2021 1301 Last data filed at 01/28/2021 1212 Gross per 24 hour  Intake 1002.4 ml  Output --  Net 1002.4 ml   There were no vitals filed for this visit.  Examination: General: Acute ill appearing elderly male lying in bed on mechanical ventilation, in NAD HEENT: ETT, MM pink/moist, PERRL,  Neuro: Unresponsive  CV: s1s2 regular rate and  rhythm, no murmur, rubs, or gallops,  PULM:  Rhonchi bilaterally, tolerating vent, no increased work of breathing  GI: soft, bowel sounds active in all 4 quadrants, non-tender, non-distended Extremities: warm/dry, no edema  Skin: no rashes or lesions  Resolved Hospital Problem list     Assessment & Plan:  Acute Hypoxic / Hypercapnic Respiratory Failure  -Concern for overdose given recent [facility] admission for SI and polysubstance use including heavy cocaine and ETOH   P: Continue ventilator support with lung protective strategies  Wean PEEP and FiO2 for sats greater than 90%. Head of bed elevated 30 degrees. Plateau pressures less than 30 cm H20.  Follow intermittent chest x-ray and ABG.   SAT/SBT as tolerated, mentation preclude extubation  Ensure adequate pulmonary hygiene  Follow cultures  VAP bundle in place  PAD protocol  Acute metabolic encephalopathy  P: Obtain LP given  immunosuppression in [facility] setting of HIV  Neuro consulted  Maintain neuro protective measures; goal for eurothermia, euglycemia, eunatermia, normoxia, and PCO2 goal of 35-40 Nutrition and bowel regiment  Seizure precautions  Obtain EEG Obtain LP Aspirations precautions   Shock; sepsis vs cardiogenic shock vs combination  -Criteria:  Heart rate 122, hypotension, leukocytosis with wbc 21.7, AMS, and acute concern for infection.  -Etiology includes possible aspiration PNA vs meningitis  P: Admit ICU Vent support as above  Pan cultures prior to antibiotic Continue ampicillin, cefepime, and vancomycin  Bedside POC [place] reveled severely dilated IVC so IV resuscitation was limited  Continue pressors for MAP< 65  Trend lactic acid Monitor urine output Obtain ECHO   AKI with concern for ATN in [facility] setting of shock  Hyperkalemia  -Creatinine 3.09, BUN 12, GFR 27 -Potassium 7.3 P: Temporizing measures provided in ED  [facility] now  Consult nephrology  Place HD cath  Follow renal function  Monitor urine  output Trend Bmet Avoid nephrotoxins Ensure adequate renal perfusion   Elevated liver enzymes  -ETOH < 10 and salicylate level <7 P: Avoid hepatotoxins  Trend LFT's Obtain hepatitis panel    Best Practice    Diet/type: NPO DVT prophylaxis: prophylactic heparin  GI prophylaxis: PPI Lines: N/A Foley:  N/A Code Status:  full code Last date of multidisciplinary goals of care discussion: Pending   Labs   CBC: Recent Labs  Lab 01/28/21 1102 01/28/21 1125 01/28/21 1144  WBC 21.7*  --   --   NEUTROABS 15.6*  --   --   HGB 13.1 15.0 13.3  HCT 42.6 44.0 39.0  MCV 87.7  --   --   PLT 416*  --   --     Basic Metabolic Panel: Recent Labs  Lab 01/28/21 1102 01/28/21 1125 01/28/21 1144  NA 144 142 140  K 7.3* 7.0* 7.3*  CL 110 108  --   CO2 24  --   --   GLUCOSE 90 90  --   BUN 12 13  --   CREATININE 3.09* 2.90*  --   CALCIUM 7.2*  --   --    GFR: Estimated Creatinine Clearance: 36 mL/min (A) (by C-G formula based on SCr of 2.9 mg/dL (H)). Recent Labs  Lab 01/28/21 1102  WBC 21.7*    Liver Function Tests: Recent Labs  Lab 01/28/21 1102  AST 103*  ALT 117*  ALKPHOS 71  BILITOT 0.7  PROT 6.3*  ALBUMIN 3.7   No results for input(s): LIPASE, AMYLASE in [facility] last 168 hours. Recent Labs  Lab 01/28/21 1116  AMMONIA 75*    ABG    Component Value Date/Time   PHART 7.254 (L) 01/28/2021 1144   PCO2ART 41.9 01/28/2021 1144   PO2ART 183 (H) 01/28/2021 1144   HCO3 18.7 (L) 01/28/2021 1144   TCO2 20 (L) 01/28/2021 1144   ACIDBASEDEF 8.0 (H) 01/28/2021 1144   O2SAT 99.0 01/28/2021 1144     Coagulation Profile: No results for input(s): INR, PROTIME in [facility] last 168 hours.  Cardiac Enzymes: No results for input(s): CKTOTAL, CKMB, CKMBINDEX, TROPONINI in [facility] last 168 hours.  HbA1C: Hgb A1c MFr Bld  Date/Time Value Ref Range Status  01/18/2021 08:14 AM 5.7 (H) 4.8 - 5.6 % Final    Comment:    (NOTE) Pre diabetes:          5.7%-6.4%  Diabetes:               >6.4%  Glycemic control for   <7.0% adults with diabetes     CBG: Recent Labs  Lab 01/28/21 1201 01/28/21 1214  GLUCAP 63* 92    Review of Systems:   Unable to assess   Past Medical History:  [facility],  has no past medical history on file.   Surgical History:  No past surgical history on file.   Social History:   reports that [facility]land has been smoking cigarettes. [facility] has never used smokeless tobacco. [facility] reports current alcohol use. [facility] reports current drug use. Drug: Cocaine.   Family History:  His family history is not on file.   Allergies Allergies  Allergen  Reactions   Gabapentin Other (See Comments)    Stomach cramps   Haldol [Haloperidol] Other (See Comments)    Dystonia   Naloxone Swelling    Tongue swelling   Prednisone Swelling    Pharyngeal swelling   Suboxone [Buprenorphine Hcl-Naloxone Hcl] Swelling    Tongue swelling   Lurasidone Anxiety   Ziprasidone Hives     Home Medications  Prior to Admission medications   Medication Sig Start Date End Date Taking? Authorizing Provider  bictegravir-emtricitabine-tenofovir AF (BIKTARVY) 50-200-25 MG TABS tablet Take 1 tablet by mouth daily. 11/28/20   [provider]  mirtazapine (REMERON) 30 MG tablet Take 1 tablet (30 mg total) by mouth at bedtime. 01/27/21   Carlyn Reichert, MD     Critical care time:   Performed by: Annalese Stiner D. Harris  Total critical care time: 52 minutes  Critical care time was exclusive of separately billable procedures and treating other patients.  Critical care was necessary to treat or prevent imminent or life-threatening deterioration.  Critical care was time spent personally by me on [facility] following activities: development of treatment plan with Rowland and/or surrogate as well as nursing, discussions with consultants, evaluation of Rowland's response to treatment, examination of Rowland, obtaining history from Rowland or surrogate, ordering and performing treatments and  interventions, ordering and review of laboratory studies, ordering and review of radiographic studies, pulse oximetry and re-evaluation of Rowland's condition.  Quame Spratlin D. Tiburcio Pea, NP-C [facility] Personal contact information can be found on Amion  01/28/2021, 1:51 PM

## 2021-01-28 NOTE — Progress Notes (Signed)
eLink Physician-Brief Progress Note Patient Name: [facility] Rowland DOB: 1990-06-06 MRN: [id number]   Date of Service  01/28/2021  HPI/Events of Note  Patient's significant other is extremely anxious and stressed out and was asking for patient to be transferred to the [facility] because he is a veteran".  eICU Interventions  I reassured her that he was getting the excellent care here and it was unlikely that transferring to the [place] at this time would lead to significantly different care, this seemed to reassure her. I encouraged her to pass on any information she might have regarding what might have happened in the period leading up to his being found unresponsive.        Thomasene Lot Bode Pieper 01/28/2021, 8:14 PM

## 2021-01-28 NOTE — Progress Notes (Signed)
LTM hooked up. Patient was not glued due to no air available to dry.

## 2021-01-28 NOTE — ED Provider Notes (Addendum)
Howard Row[facility] Provider Note   CSN: [id number] Arrival date & time: 01/28/21  1055     History Chief Complaint  Patient presents with   unresponsive     Howard Rowland is a 30 y.o. male.  HPI  The history is provided by EMS as the patient presents unresponsive.  Briefly, EMS states that the patient was found unresponsive by bystander.  He reportedly was administered Narcan due to concern for decreased respirations prior to EMS arrival. Pt was given another 51m of Narcan by fire. EMS arrived on scene and found the patient with normal respirations, altered and obtunded, pupils normal in size.  [facility] obtunded, on a nonrebreather, hemodynamically unstable, O2 saturations between 68% and 90% on a nonrebreather, labored breathing, coarse breath sounds bilaterally.  Level 5 caveat due to patient mental status.  The patient was observed to be seizing on arrival and was emergently intubated for airway protection and hypoxia.  No past medical history on file.  Patient Active Problem List   Diagnosis Date Noted   Respiratory failure ([place] 01/28/2021   Sh[place] ([place]    Status epilepticus ([place]    MDD (major depressive disorder), recurrent, severe, with psychosis ([place] 01/21/2021   Cocaine abuse ([place] 01/21/2021   Nondependent alcohol abuse, in remission 01/21/2021   Tobacco use disorder 01/21/2021   Post traumatic stress disorder (PTSD) 01/21/2021   Marijuana abuse 01/21/2021    No past surgical history on file.     No family history on file.  Social History   Tobacco Use   Smoking status: Every Day    Types: Cigarettes   Smokeless tobacco: Never  Substance Use Topics   Alcohol use: Yes   Drug use: Yes    Types: Cocaine    Home Medications Prior to Admission medications   Medication Sig Start Date End Date Taking? Authorizing Provider  bictegravir-emtricitabine-tenofovir  AF (BIKTARVY) 50-200-25 MG TABS tablet Take 1 tablet by mouth daily. 11/28/20   [provider]  mirtazapine (REMERON) 30 MG tablet Take 1 tablet (30 mg total) by mouth at bedtime. 01/27/21   GCorky Sox MD  mirtazapine (REMERON) 45 MG tablet Take 45 mg by mouth at bedtime.    [provider]  pregabalin (LYRICA) 75 MG capsule Take 75 mg by mouth 2 (two) times daily.    [provider]    Allergies    Gabapentin, Haldol [haloperidol], Naloxone, Prednisone, Suboxone [buprenorphine hcl-naloxone hcl], Lurasidone, and Ziprasidone  Review of Systems   Review of Systems  Unable to perform ROS: Mental status change   Physical Exam Updated Vital Signs BP 137/77   Pulse 87   Temp 100.2 F (37.9 C)   Resp (!) 28   Ht '5\' 8"'  (1.727 m)   SpO2 99%   BMI 22.50 kg/m   Physical Exam Vitals and nursing note reviewed.  Constitutional:      General: He is in acute distress.     Appearance: He is ill-appearing and diaphoretic.     Comments: Actively seizing, GCS 7 (1-2-4).   HENT:     Head: Normocephalic and atraumatic.     Mouth/Throat:     Comments: Frothy secretions in the oropharynx Eyes:     Conjunctiva/sclera: Conjunctivae normal.     Comments: Pupils 3 mm bilaterally and sluggishly reactive  Cardiovascular:     Rate and Rhythm: Regular rhythm. Tachycardia present.  Pulmonary:  Effort: Respiratory distress present.     Breath sounds: Rhonchi present.     Comments: Coarse breath sounds bilaterally, right greater than left Abdominal:     General: Abdomen is flat. There is no distension.  Musculoskeletal:        General: No deformity or signs of injury.     Cervical back: Neck supple.  Skin:    Findings: No lesion or rash.  Neurological:     Comments: Actively seizing, GCS 7 (1-2-4).  Appears to be moving all 4 extremities in response to painful stimuli, somewhat decreased movement of the right upper extremity noted    ED Results / Procedures /  Treatments   Labs (all labs ordered are listed, but only abnormal results are displayed) Labs Reviewed  URINALYSIS, ROUTINE W REFLEX MICROSCOPIC - Abnormal; Notable for the following components:      Result Value   Color, Urine AMBER (*)    APPearance CLOUDY (*)    Hgb urine dipstick LARGE (*)    Protein, ur 30 (*)    Bacteria, UA FEW (*)    All other components within normal limits  RAPID URINE DRUG SCREEN, HOSP PERFORMED - Abnormal; Notable for the following components:   Opiates POSITIVE (*)    All other components within normal limits  CBC WITH DIFFERENTIAL/PLATELET - Abnormal; Notable for the following components:   WBC 21.7 (*)    Platelets 416 (*)    Neutro Abs 15.6 (*)    Monocytes Absolute 2.8 (*)    Abs Immature Granulocytes 0.56 (*)    All other components within normal limits  SALICYLATE LEVEL - Abnormal; Notable for the following components:   Salicylate Lvl <3.8 (*)    All other components within normal limits  COMPREHENSIVE METABOLIC PANEL - Abnormal; Notable for the following components:   Potassium 7.3 (*)    Creatinine, Ser 3.09 (*)    Calcium 7.2 (*)    Total Protein 6.3 (*)    AST 103 (*)    ALT 117 (*)    GFR, Estimated 27 (*)    All other components within normal limits  ACETAMINOPHEN LEVEL - Abnormal; Notable for the following components:   Acetaminophen (Tylenol), Serum <10 (*)    All other components within normal limits  AMMONIA - Abnormal; Notable for the following components:   Ammonia 75 (*)    All other components within normal limits  HIV ANTIBODY (ROUTINE TESTING W REFLEX) - Abnormal; Notable for the following components:   HIV Screen 4th Generation wRfx Reactive (*)    All other components within normal limits  COMPREHENSIVE METABOLIC PANEL - Abnormal; Notable for the following components:   Potassium >7.5 (*)    Chloride 112 (*)    CO2 20 (*)    Glucose, Bld 139 (*)    Creatinine, Ser 3.08 (*)    Calcium 6.4 (*)    Total Protein 4.8  (*)    Albumin 2.9 (*)    AST 129 (*)    ALT 106 (*)    GFR, Estimated 27 (*)    All other components within normal limits  PHOSPHORUS - Abnormal; Notable for the following components:   Phosphorus 5.1 (*)    All other components within normal limits  CBC - Abnormal; Notable for the following components:   WBC 21.0 (*)    All other components within normal limits  AMYLASE - Abnormal; Notable for the following components:   Amylase 537 (*)    All  other components within normal limits  CSF CELL COUNT WITH DIFFERENTIAL - Abnormal; Notable for the following components:   RBC Count, CSF 99 (*)    All other components within normal limits  CSF CELL COUNT WITH DIFFERENTIAL - Abnormal; Notable for the following components:   RBC Count, CSF 1 (*)    All other components within normal limits  PROTEIN AND GLUCOSE, CSF - Abnormal; Notable for the following components:   Glucose, CSF 100 (*)    Total  Protein, CSF 14 (*)    All other components within normal limits  CK - Abnormal; Notable for the following components:   Total CK 16,288 (*)    All other components within normal limits  LACTIC ACID, PLASMA - Abnormal; Notable for the following components:   Lactic Acid, Venous 4.9 (*)    All other components within normal limits  LACTIC ACID, PLASMA - Abnormal; Notable for the following components:   Lactic Acid, Venous 4.1 (*)    All other components within normal limits  GLUCOSE, CAPILLARY - Abnormal; Notable for the following components:   Glucose-Capillary 149 (*)    All other components within normal limits  CBG MONITORING, ED - Abnormal; Notable for the following components:   Glucose-Capillary 63 (*)    All other components within normal limits  I-STAT CHEM 8, ED - Abnormal; Notable for the following components:   Potassium 7.0 (*)    Creatinine, Ser 2.90 (*)    Calcium, Ion 0.90 (*)    All other components within normal limits  I-STAT ARTERIAL BLOOD GAS, ED - Abnormal; Notable  for the following components:   pH, Arterial 7.254 (*)    pO2, Arterial 183 (*)    Bicarbonate 18.7 (*)    TCO2 20 (*)    Acid-base deficit 8.0 (*)    Potassium 7.3 (*)    Calcium, Ion 0.94 (*)    All other components within normal limits  I-STAT ARTERIAL BLOOD GAS, ED - Abnormal; Notable for the following components:   pH, Arterial 7.182 (*)    pCO2 arterial 51.9 (*)    pO2, Arterial 151 (*)    Bicarbonate 19.2 (*)    TCO2 21 (*)    Acid-base deficit 9.0 (*)    Potassium 7.3 (*)    Calcium, Ion 0.96 (*)    All other components within normal limits  TROPONIN I (HIGH SENSITIVITY) - Abnormal; Notable for the following components:   Troponin I (High Sensitivity) 179 (*)    All other components within normal limits  TROPONIN I (HIGH SENSITIVITY) - Abnormal; Notable for the following components:   Troponin I (High Sensitivity) 226 (*)    All other components within normal limits  RESP PANEL BY RT-PCR (FLU A&B, COVID) ARPGX2  CULTURE, BLOOD (ROUTINE X 2)  CSF CULTURE W GRAM STAIN  CULTURE, BLOOD (ROUTINE X 2)  CULTURE, RESPIRATORY W GRAM STAIN  HSV 1/2 AB IGG/IGM CSF  URINE CULTURE  EXPECTORATED SPUTUM ASSESSMENT W GRAM STAIN, RFLX TO RESP C  ETHANOL  MAGNESIUM  PROCALCITONIN  CORTISOL  PROTIME-INR  APTT  LIPASE, BLOOD  CRYPTOCOCCAL ANTIGEN, CSF  HEPATITIS PANEL, ACUTE  COOXEMETRY PANEL  GLUCOSE, CAPILLARY  STREP PNEUMONIAE URINARY ANTIGEN  LEGIONELLA PNEUMOPHILA SEROGP 1 UR AG  RAPID URINE DRUG SCREEN, HOSP PERFORMED  CMV DNA BY PCR, QUALITATIVE  EPSTEIN BARR VRS(EBV DNA BY PCR)  ENTEROVIRUS PCR  JC VIRUS, PCR CSF  MISC [facility] TEST (SEND OUT)  MISC [facility] TEST (SEND OUT)  T-HELPER CELLS (CD4) COUNT (NOT AT [facility])  MISC [facility] TEST (SEND OUT)  MISC [facility] TEST (SEND OUT)  CBC  BASIC METABOLIC PANEL  BLOOD GAS, ARTERIAL  RENAL FUNCTION PANEL  MAGNESIUM  APTT  HIV-1/2 AB - DIFFERENTIATION  COOXEMETRY PANEL  CBG MONITORING, ED    EKG EKG  Interpretation  Date/Time:  Tuesday January 28 2021 11:21:08 EDT Ventricular Rate:  130 PR Interval:  136 QRS Duration: 94 QT Interval:  299 QTC Calculation: 440 R Axis:   87 Text Interpretation: Junctional tachycardia ST depr, consider ischemia, inferior leads Borderline ST elevation, anterior leads Confirmed by Regan Lemming ([id number]) on 01/28/2021 4:58:16 PM  Radiology DG Abd 1 View  Result Date: 01/28/2021 CLINICAL DATA:  Orogastric tube placement. EXAM: ABDOMEN - 1 VIEW COMPARISON:  Same day. FINDINGS: The bowel gas pattern is normal. Distal tip of nasogastric tube is seen in proximal stomach. No radio-opaque calculi or other significant radiographic abnormality are seen. IMPRESSION: Distal tip of nasogastric tube is seen in proximal stomach. Electronically Signed   By: Marijo Conception M.D.   On: 01/28/2021 16:18   DG Abdomen 1 View  Result Date: 01/28/2021 CLINICAL DATA:  Check gastric catheter placement EXAM: ABDOMEN - 1 VIEW COMPARISON:  None. FINDINGS: Gastric catheter has been advanced following the chest x-ray with the tip in the stomach. The proximal side port lies in the distal esophagus however and should be advanced deeper into the stomach. IMPRESSION: Gastric catheter as described. This should be advanced deeper into the stomach. Electronically Signed   By: Inez Catalina M.D.   On: 01/28/2021 12:09   CT HEAD WO CONTRAST (5MM)  Result Date: 01/28/2021 CLINICAL DATA:  Seizure, nontraumatic EXAM: CT HEAD WITHOUT CONTRAST TECHNIQUE: Contiguous axial images were obtained from the base of the skull through the vertex without intravenous contrast. COMPARISON:  None. FINDINGS: Brain: There is no acute intracranial hemorrhage, mass effect, or edema. Gray-white differentiation is preserved. There is no extra-axial fluid collection. Ventricles and sulci are within normal limits in size and configuration. Vascular: No hyperdense vessel or unexpected calcification. Skull: Calvarium is  unremarkable. Sinuses/Orbits: Lobular right maxillary sinus mucosal thickening. Other: Mastoid air cells are clear. Age-indeterminate left nasal bone fracture. IMPRESSION: No acute intracranial abnormality. Age-indeterminate left nasal bone fracture. Electronically Signed   By: Macy Mis M.D.   On: 01/28/2021 11:46   CT Angio Chest PE W and/or Wo Contrast  Result Date: 01/28/2021 CLINICAL DATA:  Found unresponsive EXAM: CT ANGIOGRAPHY CHEST WITH CONTRAST TECHNIQUE: Multidetector CT imaging of the chest was performed using the standard protocol during bolus administration of intravenous contrast. Multiplanar CT image reconstructions and MIPs were obtained to evaluate the vascular anatomy. CONTRAST:  40m OMNIPAQUE IOHEXOL 350 MG/ML SOLN COMPARISON:  Chest x-ray from earlier in the same day. FINDINGS: Cardiovascular: Thoracic aorta is not significantly dilated although opacification is significantly limited. Pulmonary artery shows a normal branching pattern bilaterally. No definitive filling defects are identified to suggest pulmonary embolism. Mediastinum/Nodes: Thoracic inlet is within normal limits. There are changes consistent with mediastinal and hilar adenopathy similar to that seen on recent chest x-ray. The right paratracheal adenopathy measures 2.1 cm in short axis. AP window node measures 15 mm in short axis. The hilar adenopathy is confluent and significantly enlarged in size. Subcarinal adenopathy is difficult to evaluate due to lack of contrast within the left atrium. The esophagus as visualized is within normal limits. Scattered small axillary nodes are noted. Endotracheal tube is noted 2 cm above  the carina. Gastric catheter extends the stomach. Lungs/Pleura: Lungs are well aerated bilaterally with the exception of the lower lobes which demonstrate mild-to-moderate consolidation right slightly greater than left. No sizable effusion is noted. Some patchy infiltrative changes are noted in the  right middle and right upper lobe but to a lesser degree. Upper Abdomen: Visualized upper abdomen shows the spleen to be within normal limits. No definitive adenopathy is noted. Musculoskeletal: No acute bony abnormality is noted. Review of the MIP images confirms the above findings. IMPRESSION: No evidence of pulmonary emboli. Bilateral lower lobe as well as right upper and middle lobe infiltrative densities consistent with multifocal pneumonia. Considerable bilateral hilar and mediastinal adenopathy. These changes could be related to underlying sarcoidosis although lymphoma deserves consideration as well. Tissue sampling should be considered. Electronically Signed   By: Inez Catalina M.D.   On: 01/28/2021 13:39   CT Cervical Spine Wo Contrast  Result Date: 01/28/2021 CLINICAL DATA:  Head trauma, minor, normal mental status; seizure EXAM: CT CERVICAL SPINE WITHOUT CONTRAST TECHNIQUE: Multidetector CT imaging of the cervical spine was performed without intravenous contrast. Multiplanar CT image reconstructions were also generated. COMPARISON:  None. FINDINGS: Alignment: No significant listhesis. Skull base and vertebrae: No acute fracture. Vertebral body heights are maintained. Soft tissues and spinal canal: No prevertebral fluid or swelling. No visible canal hematoma. Disc levels:  Intervertebral disc heights are maintained. Upper chest: Unremarkable Other: None. IMPRESSION: No acute cervical spine fracture. Electronically Signed   By: Macy Mis M.D.   On: 01/28/2021 11:48   DG CHEST PORT 1 VIEW  Result Date: 01/28/2021 CLINICAL DATA:  Central line placement. EXAM: PORTABLE CHEST 1 VIEW COMPARISON:  January 28, 2021. FINDINGS: Stable cardiomediastinal silhouette. Endotracheal and nasogastric tubes are unchanged in position. Interval placement of right internal jugular catheter is noted with distal tip in expected position of SVC. Interval placement of left subclavian catheter with distal tip in expected  position of SVC. No pneumothorax is noted. Left lung is clear. Mild right basilar atelectasis or infiltrate is noted. Bony thorax is unremarkable. IMPRESSION: Interval placement of bilateral internal jugular catheters with tips in SVC. No pneumothorax is noted. Increased right basilar opacity is noted concerning for pneumonia or atelectasis. Electronically Signed   By: Marijo Conception M.D.   On: 01/28/2021 16:17   DG Chest Portable 1 View  Result Date: 01/28/2021 CLINICAL DATA:  Check endotracheal tube placement EXAM: PORTABLE CHEST 1 VIEW COMPARISON:  None. FINDINGS: Cardiac shadow is within normal limits. Endotracheal tube is noted in satisfactory position. Gastric catheter extends into the distal esophagus. This should be advanced several cm deeper into the stomach. Lungs are well aerated bilaterally. Fullness in the right paratracheal region and hila bilaterally is noted consistent with lymphadenopathy. No focal infiltrate or effusion is noted. IMPRESSION: Tubes and lines as described above. Gastric catheter should be advanced several cm deeper into the stomach. Changes consistent with lymphadenopathy in the right paratracheal region and bilateral hila. This may represent underlying sarcoidosis although CT of the chest with contrast is recommended for further evaluation. Electronically Signed   By: Inez Catalina M.D.   On: 01/28/2021 12:07   EEG adult  Result Date: 01/28/2021 Lora Havens, MD     01/28/2021  4:37 PM Patient Name: Howard [facility] MRN: [id number] Epilepsy Attending: Lora Havens Referring Physician/Provider: Asa Saunas, NP Date: 01/28/2021 Duration: 22.21 mins Patient history: 30yo M with ams. EEG to evaluate for seizure Level of alertness: comatose AEDs during EEG  study: LEV, versed Technical aspects: This EEG study was done with scalp electrodes positioned according to the 10-20 International system of electrode placement. Electrical activity was acquired at a sampling rate of  '500Hz'  and reviewed with a high frequency filter of '70Hz'  and a low frequency filter of '1Hz' . EEG data were recorded continuously and digitally stored. Description: EEG showed continuous generalized 3 to 6 Hz theta-delta slowing admixed an excessive amount of 15 to 18 Hz beta activity with irregular morphology distributed symmetrically and diffusely.  Hyperventilation and photic stimulation were not performed.   ABNORMALITY - Continuous slow, generalized - Excessive beta, generalized IMPRESSION: This study is suggestive of moderate to severe diffuse encephalopathy, nonspecific etiology but likely related to sedation. The excessive beta activity seen in the background is most likely due to the effect of benzodiazepine and is a benign EEG pattern. No seizures or epileptiform discharges were seen throughout the recording. [place]    Procedures .Critical Care  Date/Time: 01/28/2021 11:18 AM Performed by: Regan Lemming, MD Authorized by: Regan Lemming, MD   Critical care provider statement:    Critical care time (minutes):  80   Critical care was necessary to treat or prevent imminent or life-threatening deterioration of the following conditions:  Respiratory failure and CNS failure or compromise   Critical care was time spent personally by me on the following activities:  Discussions with consultants, evaluation of patient's response to treatment, examination of patient, ordering and performing treatments and interventions, ordering and review of laboratory studies, ordering and review of radiographic studies, pulse oximetry, re-evaluation of patient's condition, obtaining history from patient or surrogate, review of old charts and ventilator management   Care discussed with: admitting provider   Procedure Name: Intubation Date/Time: 01/28/2021 11:35 AM Performed by: Regan Lemming, MD Pre-anesthesia Checklist: Patient identified, Emergency Drugs available, Suction available, Timeout performed and  Patient being monitored Oxygen Delivery Method: Ambu bag Preoxygenation: Pre-oxygenation with 100% oxygen Induction Type: Rapid sequence Ventilation: Two handed mask ventilation required Laryngoscope Size: Glidescope and 3 Grade View: Grade I Tube size: 7.5 mm Number of attempts: 1 Airway Equipment and Method: Video-laryngoscopy Placement Confirmation: ETT inserted through vocal cords under direct vision, CO2 detector and Breath sounds checked- equal and bilateral Secured at: 23 cm Tube secured with: ETT holder      Medications Ordered in ED Medications  propofol (DIPRIVAN) 1000 MG/100ML infusion (has no administration in time range)  vasopressin (PITRESSIN) 20 Units in sodium chloride 0.9 % 100 mL infusion-*FOR SHOCK* (0.03 Units/min Intravenous New Bag/Given 01/28/21 1238)  docusate sodium (COLACE) capsule 100 mg (has no administration in time range)  polyethylene glycol (MIRALAX / GLYCOLAX) packet 17 g (has no administration in time range)  levETIRAcetam (KEPPRA) IVPB 1000 mg/100 mL premix (has no administration in time range)  hydrocortisone sodium succinate (SOLU-CORTEF) 100 MG injection 100 mg (100 mg Intravenous Given 01/28/21 1552)  heparin injection 1,000-6,000 Units (1,000 Units CRRT Given 01/28/21 1755)  heparin 10,000 units/ 20 mL infusion syringe (500 Units/hr CRRT New Bag/Given 01/28/21 2213)  heparinized saline (2000 units/L) primer fluid for CRRT (has no administration in time range)  prismasol BGK 0/2.5 infusion ( CRRT New Bag/Given 01/28/21 2100)  sodium bicarbonate 150 mEq in sterile water 1,150 mL infusion ( CRRT New Bag/Given 01/28/21 2100)  prismasol BGK 2/2.5 dialysis solution ( CRRT New Bag/Given 01/28/21 2100)  pantoprazole (PROTONIX) injection 40 mg (40 mg Intravenous Given 01/28/21 1552)  norepinephrine (LEVOPHED) 16 mg in 264m premix infusion (34 mcg/min Intravenous New  Bag/Given 01/28/21 2100)  ceFEPIme (MAXIPIME) 2 g in sodium chloride 0.9 % 100 mL IVPB (0 g  Intravenous Stopped 01/28/21 2017)  acetaminophen (TYLENOL) tablet 650 mg (650 mg Per Tube Given 01/28/21 1837)  fentaNYL (SUBLIMAZE) injection 50 mcg (has no administration in time range)  fentaNYL (SUBLIMAZE) injection 50-200 mcg (has no administration in time range)  dexmedetomidine (PRECEDEX) 400 MCG/100ML (4 mcg/mL) infusion (0.3 mcg/kg/hr  67.1 kg Intravenous Infusion Verify 01/28/21 2100)  chlorhexidine gluconate (MEDLINE KIT) (PERIDEX) 0.12 % solution 15 mL (15 mLs Mouth Rinse Given 01/28/21 2100)  MEDLINE mouth rinse (has no administration in time range)  acyclovir (ZOVIRAX) 670 mg in dextrose 5 % 100 mL IVPB (has no administration in time range)  vancomycin (VANCOREADY) IVPB 750 mg/150 mL (has no administration in time range)  lactated ringers bolus 1,000 mL (0 mLs Intravenous Stopped 01/28/21 1206)  etomidate (AMIDATE) injection (20 mg Intravenous Given 01/28/21 1057)  rocuronium (ZEMURON) injection (100 mg Intravenous Given 01/28/21 1058)  EPINEPhrine 10 mcg/mL Adult IV Push Syringe (For Blood Pressure Support) (30 mcg Intravenous Given 01/28/21 1139)  norepinephrine (LEVOPHED) 60m in 25276mpremix infusion ( Intravenous Stopped 01/28/21 1316)  propofol (DIPRIVAN) 1000 MG/100ML infusion (0 mcg/kg/min Intravenous Stopped 01/28/21 1125)  LORazepam (ATIVAN) injection 2 mg (2 mg Intravenous Given 01/28/21 1056)  furosemide (LASIX) injection 40 mg (40 mg Intravenous Given 01/28/21 1631)  calcium gluconate 1 g/ 50 mL sodium chloride IVPB (0 g Intravenous Stopped 01/28/21 1305)  insulin aspart (novoLOG) injection 10 Units (10 Units Intravenous Given 01/28/21 1213)    And  dextrose 50 % solution 50 mL (50 mLs Intravenous Given 01/28/21 1204)  iohexol (OMNIPAQUE) 350 MG/ML injection 65 mL (65 mLs Intravenous Contrast Given 01/28/21 1310)  levETIRAcetam (KEPPRA) IVPB 1000 mg/100 mL premix (0 mg Intravenous Stopped 01/28/21 1652)  sodium bicarbonate injection 50 mEq (50 mEq Intravenous Given 01/28/21 1603)   calcium chloride 10 % injection (1,000 mg  Given 01/28/21 1526)  insulin aspart (novoLOG) injection 10 Units (10 Units Intravenous Given 01/28/21 1527)    And  dextrose 50 % solution 50 mL (50 mLs Intravenous Given 01/28/21 1526)  sodium bicarbonate injection 50 mEq (50 mEq Intravenous Given 01/28/21 1527)  albuterol (PROVENTIL) (2.5 MG/3ML) 0.083% nebulizer solution 10 mg (10 mg Nebulization Given 01/28/21 1608)  vancomycin (VANCOREADY) IVPB 1500 mg/300 mL (0 mg Intravenous Stopped 01/28/21 2022)  fentaNYL (SUBLIMAZE) injection 100 mcg (100 mcg Intravenous Given 01/28/21 1755)  levETIRAcetam (KEPPRA) 3,000 mg in sodium chloride 0.9 % 250 mL IVPB (0 mg Intravenous Stopped 01/28/21 2022)    ED Course  I have reviewed the triage vital signs and the nursing notes.  Pertinent labs & imaging results that were available during my care of the patient were reviewed by me and considered in my medical decision making (see chart for details).    MDM Rules/Calculators/A&P                           [age]ear old male with a history of HIV and polysubstance abuse who was found unresponsive by a bystander.  He reportedly was administered Narcan due to concern for decreased respirations prior to EMS arrival. Pt was given another 76m50mf Narcan by fire. EMS arrived on scene and found the patient with normal respirations, altered and obtunded, pupils normal in size.  [facility] where he arrived obtunded, on a nonrebreather, hemodynamically unstable, O2 saturations between 68% and 90%  on a nonrebreather, labored breathing, coarse breath sounds bilaterally.  Level 5 caveat due to patient mental status.  The patient was observed to be seizing on arrival and was emergently intubated for airway protection and hypoxia.  Differential diagnosis is broad and complex: CVA, drug overdose, anoxic brain injury, cocaine induced myocardial ischemia, status epilepticus, electrolyte  abnormality, rhabdomyolysis, meningitis/encephalitis, hypoglycemia, brain abscess, hepatic encephalopathy, other toxic metabolic derangement, sepsis/shock (PNA, UTI, abdominal or CNS source).  Due to hypotension, single push dose of epinephrine was administered.  The patient was started on Levophed and then subsequently vasopressin to maintain hemodynamic stability.  A point of care ultrasound was performed which revealed poor cardiac squeeze. EKG revealed a junctional tachycardia and nonspecific ST changes in the setting of shock and recent push dose epinephrine administration. Labs were significant for hyperkalemia to 7.3.  The patient was temporized with calcium gluconate, insulin dextrose in the emergency [facility].  He was found to have multisystem organ failure with a creatinine of 3.09, AST 103, ALT 117, initial troponin 179.  He was found to have a leukocytosis to 21.  Unclear if this is from seizure activity or from an infectious source.    The patient was administered broad-spectrum antimicrobials to cover for meningitis/encephalitis with cefepime, vancomycin, ampicillin, acyclovir.  CT imaging of the head was performed without evidence of intracranial hemorrhage or other abnormality.  CT cervical spine without evidence of acute fracture or malalignment. CTA imaging without evidence of PE.  CXR showed lymphadenopathy in the right paratracheal region and bilateral hila.  Concern for rhabdomyolysis in the setting of altered mental status/obtundation with unclear downtime.  CK ordered and pending.  Given the patient's acute renal failure, hyper kalemia, nephrology was consulted for emergent hemodialysis/CRRT.   Given the patient's seizure-like activity/concern for status epilepticus, the patient was administered 60 megs per keg of Keppra and neurology was consulted for EEG monitoring and further recommendations.  Critical care was consulted for admission to the ICU.  Plan for LP inpatient as the  patient currently has a ready bed. The patient was subsequently admitted in critical condition.  Final Clinical Impression(s) / ED Diagnoses Final diagnoses:  Status epilepticus ([place])  Altered mental status, unspecified altered mental status type  Shock ([place])  Acute respiratory failure with hypoxia ([place])  Non-traumatic rhabdomyolysis    Rx / DC Orders ED Discharge Orders     None        Regan Lemming, MD 01/28/21 2232    Regan Lemming, MD 01/29/21 [phone]

## 2021-01-28 NOTE — Consult Note (Addendum)
Reason for Consult: ARF Referring Physician: Thora Lance, MD  Duncan Alejandro is an 30 y.o. male with a PMH significant for polysubstance abuse, PTSD, HIV, and major depressive disorder who was found unresponsive in an hotel room.  EMS was called and gave narcan without improvement.  In the ED he was having seizure like activity and remained obtunded.  He was intubated for airway protection.  He was hypotensive and tachycardic and started on pressors.  Labs were notable for K 7.3, BUN 12, Cr 3.09, Ca 7.2, WBC 21.7, AST 103, ALT 117, amylase 537, glucose 63, and CK 16,288.  ABG pH 7.182, pCo2 52, pO2 151.  He was admitted to the ICU for intensive management of his multiorgan failure.  We were consulted due to his ARF and hyperkalemia and possible role for CRRT.  Toxicology is pending but recently had been hospitalized at behavioral health with suicidal ideation and cocaine/alcohol abuse.  The trend in Scr is below.  HPI could not be obtained from patient so existing medical records were used.   Trend in Creatinine: Creatinine, Ser  Date/Time Value Ref Range Status  01/28/2021 01:24 PM 3.08 (H) 0.61 - 1.24 mg/dL Final  93/81/8299 37:16 AM 2.90 (H) 0.61 - 1.24 mg/dL Final  96/78/9381 01:75 AM 3.09 (H) 0.61 - 1.24 mg/dL Final  03/25/8526 78:24 PM 1.24 0.61 - 1.24 mg/dL Final  23/53/6144 31:54 AM 1.18 0.61 - 1.24 mg/dL Final    PMH:  No past medical history on file.  PSH:  No past surgical history on file.  Allergies:  Allergies  Allergen Reactions   Gabapentin Other (See Comments)    Stomach cramps   Haldol [Haloperidol] Other (See Comments)    Dystonia   Naloxone Swelling    Tongue swelling   Prednisone Swelling    Pharyngeal swelling   Suboxone [Buprenorphine Hcl-Naloxone Hcl] Swelling    Tongue swelling   Lurasidone Anxiety   Ziprasidone Hives    Medications:   Prior to Admission medications   Medication Sig Start Date End Date Taking? Authorizing Provider   bictegravir-emtricitabine-tenofovir AF (BIKTARVY) 50-200-25 MG TABS tablet Take 1 tablet by mouth daily. 11/28/20   [provider]  mirtazapine (REMERON) 30 MG tablet Take 1 tablet (30 mg total) by mouth at bedtime. 01/27/21   Carlyn Reichert, MD    Inpatient medications:  albuterol  10 mg Nebulization Once   calcium chloride       insulin aspart  10 Units Intravenous Once   And   dextrose  1 ampule Intravenous Once   dextrose       furosemide  40 mg Intravenous Once   heparin  5,000 Units Subcutaneous Q8H   hydrocortisone sod succinate (SOLU-CORTEF) inj  100 mg Intravenous Q8H   pantoprazole (PROTONIX) IV  40 mg Intravenous QHS   sodium bicarbonate       sodium bicarbonate       sodium bicarbonate  50 mEq Intravenous Once   sodium bicarbonate  50 mEq Intravenous Once   sodium zirconium cyclosilicate  10 g Oral Once   vancomycin variable dose per unstable renal function (pharmacist dosing)   Does not apply See admin instructions    Discontinued Meds:   Medications Discontinued During This Encounter  Medication Reason   EPINEPHrine (ADRENALIN) 5 mg in NS 250 mL (0.02 mg/mL) premix infusion    vasopressin (PITRESSIN) 10 Units in dextrose 5 % 20 mL (0.5 Units/mL) PED/NICU infusion    cefTRIAXone (ROCEPHIN) 2 g in sodium chloride 0.9 %  100 mL IVPB    fentaNYL in NS (60mcg/ml) infusion-PREMIX    fentaNYL (SUBLIMAZE) bolus via infusion 50-100 mcg    levETIRAcetam (KEPPRA) 4,030 mg in sodium chloride 0.9 % 250 mL IVPB    pantoprazole (PROTONIX) injection 40 mg    norepinephrine (LEVOPHED)  in premix infusion    ampicillin (OMNIPEN) 1 g in sodium chloride 0.9 % 100 mL IVPB    fentaNYL (SUBLIMAZE) injection 100 mcg    fentaNYL (SUBLIMAZE) injection 100 mcg    calcium gluconate inj 10% (1 g) URGENT USE ONLY!    ceFEPIme (MAXIPIME) 2 g in sodium chloride 0.9 % 100 mL IVPB    ceFEPIme (MAXIPIME) 2 g in sodium chloride 0.9 % 100 mL IVPB     Social  History:  reports that he has been smoking cigarettes. He has never used smokeless tobacco. He reports current alcohol use. He reports current drug use. Drug: Cocaine.  Family History:  No family history on file.  Review of systems not obtained due to patient factors. Weight change:   Intake/Output Summary (Last 24 hours) at 01/28/2021 1520 Last data filed at 01/28/2021 1316 Gross per 24 hour  Intake 1218.49 ml  Output --  Net 1218.49 ml   BP 97/73   Pulse 96   Temp (!) 100.7 F (38.2 C)   Resp 20   SpO2 99%  Vitals:   01/28/21 1301 01/28/21 1315 01/28/21 1330 01/28/21 1400  BP: (!) 109/95 (!) 83/42 (!) 89/62 97/73  Pulse: (!) 109 (!) 115 (!) 102 96  Resp: Temp: (!) 100.6 F (38.1 C) (!) 100.6 F (38.1 C) (!) 100.7 F (38.2 C)   TempSrc:      SpO2: 100% 100% 100% 99%     General appearance: intubated and unresponsive Head: Normocephalic, without obvious abnormality, atraumatic Resp: clear to auscultation bilaterally Cardio: tachycardic, no rub GI: soft, non-tender; bowel sounds normal; no masses,  no organomegaly Extremities: extremities normal, atraumatic, no cyanosis or edema  Labs: Basic Metabolic Panel: Recent Labs  Lab 01/28/21 1102 01/28/21 1125 01/28/21 1144 01/28/21 1320 01/28/21 1324  NA 144 142 140 139 139  K 7.3* 7.0* 7.3* 7.3* >7.5*  CL 110 108  --   --  112*  CO2 24  --   --   --  20*  GLUCOSE 90 90  --   --  139*  BUN 12 13  --   --  14  CREATININE 3.09* 2.90*  --   --  3.08*  ALBUMIN 3.7  --   --   --  2.9*  CALCIUM 7.2*  --   --   --  6.4*  PHOS  --   --   --   --  5.1*   Liver Function Tests: Recent Labs  Lab 01/28/21 1102 01/28/21 1324  AST 103* 129*  ALT 117* 106*  ALKPHOS 71 56  BILITOT 0.7 0.9  PROT 6.3* 4.8*  ALBUMIN 3.7 2.9*   Recent Labs  Lab 01/28/21 1324  LIPASE 22  AMYLASE 537*   Recent Labs  Lab 01/28/21 1116  AMMONIA 75*   CBC: Recent Labs  Lab 01/28/21 1102 01/28/21 1125 01/28/21 1144  01/28/21 1320 01/28/21 1324  WBC 21.7*  --   --   --  21.0*  NEUTROABS 15.6*  --   --   --   --   HGB 13.1 15.0 13.3 13.9 13.6  HCT 42.6 44.0 39.0 41.0 44.8  MCV 87.7  --   --   --  88.5  PLT 416*  --   --   --  250   PT/INR: @LABRCNTIP (inr:5) Cardiac Enzymes: )No results for input(s): CKTOTAL, CKMB, CKMBINDEX, TROPONINI in the last 168 hours. CBG: Recent Labs  Lab 01/28/21 1201 01/28/21 1214  GLUCAP 63* 92    Iron Studies: No results for input(s): IRON, TIBC, TRANSFERRIN, FERRITIN in the last 168 hours.  Xrays/Other Studies: DG Abdomen 1 View  Result Date: 01/28/2021 CLINICAL DATA:  Check gastric catheter placement EXAM: ABDOMEN - 1 VIEW COMPARISON:  None. FINDINGS: Gastric catheter has been advanced following the chest x-ray with the tip in the stomach. The proximal side port lies in the distal esophagus however and should be advanced deeper into the stomach. IMPRESSION: Gastric catheter as described. This should be advanced deeper into the stomach. Electronically Signed   By: 01/30/2021 M.D.   On: 01/28/2021 12:09   CT HEAD WO CONTRAST (01/30/2021)  Result Date: 01/28/2021 CLINICAL DATA:  Seizure, nontraumatic EXAM: CT HEAD WITHOUT CONTRAST TECHNIQUE: Contiguous axial images were obtained from the base of the skull through the vertex without intravenous contrast. COMPARISON:  None. FINDINGS: Brain: There is no acute intracranial hemorrhage, mass effect, or edema. Gray-white differentiation is preserved. There is no extra-axial fluid collection. Ventricles and sulci are within normal limits in size and configuration. Vascular: No hyperdense vessel or unexpected calcification. Skull: Calvarium is unremarkable. Sinuses/Orbits: Lobular right maxillary sinus mucosal thickening. Other: Mastoid air cells are clear. Age-indeterminate left nasal bone fracture. IMPRESSION: No acute intracranial abnormality. Age-indeterminate left nasal bone fracture. Electronically Signed   By: 01/30/2021 M.D.    On: 01/28/2021 11:46   CT Angio Chest PE W and/or Wo Contrast  Result Date: 01/28/2021 CLINICAL DATA:  Found unresponsive EXAM: CT ANGIOGRAPHY CHEST WITH CONTRAST TECHNIQUE: Multidetector CT imaging of the chest was performed using the standard protocol during bolus administration of intravenous contrast. Multiplanar CT image reconstructions and MIPs were obtained to evaluate the vascular anatomy. CONTRAST:  29mL OMNIPAQUE IOHEXOL 350 MG/ML SOLN COMPARISON:  Chest x-ray from earlier in the same day. FINDINGS: Cardiovascular: Thoracic aorta is not significantly dilated although opacification is significantly limited. Pulmonary artery shows a normal branching pattern bilaterally. No definitive filling defects are identified to suggest pulmonary embolism. Mediastinum/Nodes: Thoracic inlet is within normal limits. There are changes consistent with mediastinal and hilar adenopathy similar to that seen on recent chest x-ray. The right paratracheal adenopathy measures 2.1 cm in short axis. AP window node measures 15 mm in short axis. The hilar adenopathy is confluent and significantly enlarged in size. Subcarinal adenopathy is difficult to evaluate due to lack of contrast within the left atrium. The esophagus as visualized is within normal limits. Scattered small axillary nodes are noted. Endotracheal tube is noted 2 cm above the carina. Gastric catheter extends the stomach. Lungs/Pleura: Lungs are well aerated bilaterally with the exception of the lower lobes which demonstrate mild-to-moderate consolidation right slightly greater than left. No sizable effusion is noted. Some patchy infiltrative changes are noted in the right middle and right upper lobe but to a lesser degree. Upper Abdomen: Visualized upper abdomen shows the spleen to be within normal limits. No definitive adenopathy is noted. Musculoskeletal: No acute bony abnormality is noted. Review of the MIP images confirms the above findings. IMPRESSION: No  evidence of pulmonary emboli. Bilateral lower lobe as well as right upper and middle lobe infiltrative densities consistent with multifocal pneumonia. Considerable bilateral hilar  and mediastinal adenopathy. These changes could be related to underlying sarcoidosis although lymphoma deserves consideration as well. Tissue sampling should be considered. Electronically Signed   By: Alcide CleverMark  Lukens M.D.   On: 01/28/2021 13:39   CT Cervical Spine Wo Contrast  Result Date: 01/28/2021 CLINICAL DATA:  Head trauma, minor, normal mental status; seizure EXAM: CT CERVICAL SPINE WITHOUT CONTRAST TECHNIQUE: Multidetector CT imaging of the cervical spine was performed without intravenous contrast. Multiplanar CT image reconstructions were also generated. COMPARISON:  None. FINDINGS: Alignment: No significant listhesis. Skull base and vertebrae: No acute fracture. Vertebral body heights are maintained. Soft tissues and spinal canal: No prevertebral fluid or swelling. No visible canal hematoma. Disc levels:  Intervertebral disc heights are maintained. Upper chest: Unremarkable Other: None. IMPRESSION: No acute cervical spine fracture. Electronically Signed   By: Guadlupe SpanishPraneil  Patel M.D.   On: 01/28/2021 11:48   DG Chest Portable 1 View  Result Date: 01/28/2021 CLINICAL DATA:  Check endotracheal tube placement EXAM: PORTABLE CHEST 1 VIEW COMPARISON:  None. FINDINGS: Cardiac shadow is within normal limits. Endotracheal tube is noted in satisfactory position. Gastric catheter extends into the distal esophagus. This should be advanced several cm deeper into the stomach. Lungs are well aerated bilaterally. Fullness in the right paratracheal region and hila bilaterally is noted consistent with lymphadenopathy. No focal infiltrate or effusion is noted. IMPRESSION: Tubes and lines as described above. Gastric catheter should be advanced several cm deeper into the stomach. Changes consistent with lymphadenopathy in the right paratracheal  region and bilateral hila. This may represent underlying sarcoidosis although CT of the chest with contrast is recommended for further evaluation. Electronically Signed   By: Alcide CleverMark  Lukens M.D.   On: 01/28/2021 12:07   EEG adult  Result Date: 01/28/2021 Charlsie QuestYadav, Priyanka O, MD     01/28/2021  2:14 PM Patient Name: Maximino SarinDeshawn Pedigo MRN: [id number] Epilepsy Attending: Charlsie QuestPriyanka O Yadav Referring Physician/Provider: Hart RochesterWilliam Minor, NP Date: 01/28/2021 Duration: Patient history: 30yo M with ams. EEG to evaluate for seizure Level of alertness: comatose AEDs during EEG study: LEV, versed Technical aspects: This EEG study was done with scalp electrodes positioned according to the 10-20 International system of electrode placement. Electrical activity was acquired at a sampling rate of 500Hz  and reviewed with a high frequency filter of 70Hz  and a low frequency filter of 1Hz . EEG data were recorded continuously and digitally stored. Description: EEG showed continuous generalized 3 to 6 Hz theta-delta slowing admixed an excessive amount of 15 to 18 Hz beta activity with irregular morphology distributed symmetrically and diffusely.  Hyperventilation and photic stimulation were not performed.   ABNORMALITY - Continuous slow, generalized - Excessive beta, generalized IMPRESSION: This study is suggestive of moderate to severe diffuse encephalopathy, nonspecific etiology but likely related to sedation. The excessive beta activity seen in the background is most likely due to the effect of benzodiazepine and is a benign EEG pattern. No seizures or epileptiform discharges were seen throughout the recording. Priyanka Annabelle Harman Yadav     Assessment/Plan:  AKI - presumably due to combination of rhabdomyolysis and ischemic ATN in setting of shock, however toxicology studies are pending.  Given life-threatening hyperkalemia, will start CRRT with low K after HD catheter is placed by PCCM.   2K/2.5Ca dialysate at 1500 ml/hr, 0K/2.5Ca pre-filter  replacement fluid at 500 ml/hr, isotonic bicarb post-filter at 200 ml/hr Heparin for anticoagulation Keep even for now given hypotension and pressors. Acute hypoxic respiratory failure with hypercapnia - s/p intubation, management per PCCM Shock -  unclear if sepsis vs cardiogenic vs combination - s/p LP to r/o meningitis and started on ampicillin, cefepime, and vancomycin.  Currently on levophed and dobutamine per PCCM.  Will need ECHO.  CT angio negative for PE. Hyperkalemia - due to #1.  CVVHD as above.  Will use low K bath and bicarb. Acute metabolic encephalopathy - s/p LP, Neuro consulted.  EEG with nonspecific moderate to severe diffuse encephalopathy.  No seizure activity seen.  For MRI of brain when more stable. HIV - was on BIKTARVY but compliance was sporadic. Non anion gap metabolic acidosis - toxicology studies pending and will use bicarbonate with CVVHD.   Julien Nordmann Ahni Bradwell 01/28/2021, 3:20 PM

## 2021-01-28 NOTE — Progress Notes (Signed)
Pharmacy Antibiotic Note  [facility] is a 30 y.o. male admitted on 01/28/2021 with  r/o herpes encephalitis/menigitis .  Pharmacy has been consulted for acyclovir, cefepime and vancomycin dosing. WBC elevated. SCr 2.9 (BL ~ 1.1). Tm 100.82F   Plan: -Start acyclovir 10 mg/kg (671 mg) Q 12 hours. Will increase dose once renal fx has improved -Cefepime 2 gM IV Q 12 hours -Vancomycin 1500 mg IV load followed dosing per levels  -Monitor CBC, renal fx, cultures and clinical progress -Will check a 24 hours vanc random to determine further dosing      Temp (24hrs), Avg:100.1 F (37.8 C), Min:97.6 F (36.4 C), Max:100.7 F (38.2 C)  Recent Labs  Lab 01/28/21 1102 01/28/21 1125  WBC 21.7*  --   CREATININE 3.09* 2.90*    Estimated Creatinine Clearance: 36 mL/min (A) (by C-G formula based on SCr of 2.9 mg/dL (H)).    Allergies  Allergen Reactions   Gabapentin Other (See Comments)    Stomach cramps   Haldol [Haloperidol] Other (See Comments)    Dystonia   Naloxone Swelling    Tongue swelling   Prednisone Swelling    Pharyngeal swelling   Suboxone [Buprenorphine Hcl-Naloxone Hcl] Swelling    Tongue swelling   Lurasidone Anxiety   Ziprasidone Hives    Antimicrobials this admission: Cefepime 8/30 >>  Vancomycin 8/30 >>  Acyclovir 8/30 >>   Dose adjustments this admission:   Microbiology results: 8/30 BCx:  8/30 UCx:   8/30 CMV PCR >> 8/30 EBV >>  8/30 CSF >>  8/30 HSV>>   Thank you for allowing pharmacy to be a part of this patient's care.  Vinnie Level, PharmD., BCPS, BCCCP Clinical Pharmacist Please refer to [facility] for unit-specific pharmacist

## 2021-01-28 NOTE — Consult Note (Addendum)
NEUROLOGY CONSULTATION NOTE   Date of service: January 28, 2021 Patient Name: Howard Rowland MRN:  [id number] DOB:  1990-07-25 Reason for consult: generalized seizure Requesting physician: Regan Lemming MD _ _ _   _ __   _ __ _ _  __ __   _ __   __ _  History of Present Illness   Patient is unable to provide hx 2/2 being intubated and sedated. PMH significant for polysubstance abuse, PTSD, HIV, and major depressive disorder. Per EDP note "The history is provided by EMS as the patient presents unresponsive.  Briefly, EMS states that the patient was found unresponsive by bystander.  He reportedly was administered Narcan due to concern for decreased respirations prior to EMS arrival. Pt was given another 19m of Narcan by fire. EMS arrived on scene and found the patient with normal respirations, altered and obtunded, pupils normal in size.  [facility], on a nonrebreather, hemodynamically stable, O2 saturations between 68% and 90% on a nonrebreather, labored breathing, coarse breath sounds bilaterally." He devloped witnessed seizure activity in ED and received ativan and was intubated for airway protection. Febrile since arrival up to 100.7. He was just discharged from behavioral health after presenting with SI and HI.  K+ 7.3 Cr 3.09 Ca++ 7.2 AST/ALT 103/117 CBC 276.7EtOH, salicylate, acetominophen levels WNL  UA now c/w infection blood cx x2 pending  ammonia 75  UDS (+) for opiates only (10 days ago was (+) for cocaine and THC) Extended drugs of abuse panel, wellbutrin level pending from [facility]  He was loaded with LEV 489mkg and continued on 100015mid. He is sedated with fentanyl and versed. Initial EEG showed diffuse slowing with overriding beta (the later representing medication effect)  Patient started on empiric coverage for CNS infection with vanc, cefepime, acyclovir and ampicillin  He is about to undergo LP.  Pending labs are as follows:  Unresulted Labs (From admission, onward)     Start     Ordered   01/29/21 1400  Vancomycin, random  Once-Timed,   R        01/28/21 1430   01/29/21 0500  CBC  Tomorrow morning,   R        01/28/21 1258   01/29/21 [id number] metabolic panel  Tomorrow morning,   R        01/28/21 1258   01/29/21 0500  Blood gas, arterial  Tomorrow morning,   R        01/28/21 1258   01/28/21 1433  .Cooxemetry Panel (carboxy, met, total hgb, O2 sat)  Once,   R        01/28/21 1433   01/28/21 1344  Hepatitis panel, acute  Once,   STAT        01/28/21 1343   01/28/21 1333  Lactic acid, plasma  STAT Now then every 3 hours,   R (with STAT occurrences)      01/28/21 1332   01/28/21 1320  Miscellaneous [facility] test (send-out)  Once,   STAT       Question:  Test name / description:  Answer:  Bupropion and Hydroxybupropion, Serum or Plasma. [facility] [id number]/30/22 1320   01/28/21 1318  Miscellaneous [facility] test (send-out)  Once,   STAT       Question:  Test name / description:  Answer:  ToxAssure Flex 23, Urine. [facility] [id number]/30/22 1320   01/28/21 1309  Urine Culture  Once,   STAT       Question:  Indication  Answer:  Altered mental status (if no other cause identified)   01/28/21 1308   01/28/21 1307  T-helper cells (CD4) count (not at [facility])  Once,   STAT        01/28/21 1306   01/28/21 1306  CK  ONCE - STAT,   STAT        01/28/21 1305   01/28/21 1304  Miscellaneous [facility] test (send-out)  Once,   STAT       Question:  Test name / description:  Answer:  Human Herpesvirus 6 (HHV-6), DNA PCR, [facility] test ID [id number]   01/28/21 1304   01/28/21 1303  Miscellaneous [facility] test (send-out)  Once,   STAT       Question:  Test name / description:  Answer:  [facility] Varicella Zoster Virus (VZV), Cerebrospinal Fluid (CSF), DNA PCR. TEST: [id number]   01/28/21 1304   01/28/21 1300  JC virus, PRC CSF  Once,   STAT       Question:  Release to patient  Answer:  Immediate    01/28/21 1304   01/28/21 1300  Body fluid culture w Gram Stain  Once,   STAT       Question:  Are there also cytology or pathology orders on this specimen?  Answer:  No   01/28/21 1304   01/28/21 1259  Urine rapid drug screen (hosp performed)  ONCE - STAT,   STAT        01/28/21 1258   01/28/21 1259  Protein and glucose, CSF  Once,   STAT        01/28/21 1304   01/28/21 1258  CMV dna by pcr, qualitative  Once,   STAT       Comments: Plese run on CSF   Question:  Release to patient  Answer:  Immediate   01/28/21 1304   01/28/21 1258  Cryptococcal antigen, CSF  Once,   STAT       Question:  Release to patient  Answer:  Immediate   01/28/21 1304   01/28/21 1258  CSF cell count with differential collection tube #: 1  (CSF cell count with differential (x 2 tubes) panel)  Once,   STAT       Question Answer Comment  collection tube # 1   Are there also cytology or pathology orders on this specimen? No   Release to patient Immediate      01/28/21 1304   01/28/21 1258  Epstein barr vrs(ebv dna by pcr)  Once,   STAT       Comments: Run on CSF and NOT on Blood   Question:  Release to patient  Answer:  Immediate   01/28/21 1304   01/28/21 1258  Enterovirus pcr  Once,   STAT       Comments: Run on CSF and NOT on blood    01/28/21 1304   01/28/21 1258  HSV 1/2 Ab IgG/IgM CSF  Once,   STAT       Question:  Release to patient  Answer:  Immediate   01/28/21 1304   01/28/21 1258  Toxoplasma Gondii, PCR  Once,   STAT       Comments: Please run on CSF   Question:  Release to patient  Answer:  Immediate   01/28/21 1304   01/28/21 1258  VDRL, CSF  Once,   STAT  Question:  Release to patient  Answer:  Immediate   01/28/21 1304   01/28/21 1258  CSF cell count with differential collection tube #: 4  (CSF cell count with differential (x 2 tubes) panel)  Once,   STAT       Question Answer Comment  collection tube # 4   Are there also cytology or pathology orders on this specimen? No    Release to patient Immediate      01/28/21 1304   01/28/21 1257  Culture, Respiratory w Gram Stain (tracheal aspirate)  Once,   STAT       Question:  Patient immune status  Answer:  Immunocompromised   01/28/21 1258   01/28/21 1257  Legionella Pneumophila Serogp 1 Ur Ag  Once,   STAT        01/28/21 1258   01/28/21 1251  HIV Antibody (routine testing w rflx)  (HIV Antibody (Routine testing w reflex) panel)  Once,   STAT        01/28/21 1258   01/28/21 1251  Procalcitonin  Once,   STAT        01/28/21 1258   01/28/21 1251  Culture, blood (routine x 2)  BLOOD CULTURE X 2,   R (with STAT occurrences)     Question:  Patient immune status  Answer:  Immunocompromised   01/28/21 1258   01/28/21 1251  Strep pneumoniae urinary antigen  Once,   STAT        01/28/21 1258   01/28/21 1251  Blood gas, arterial  Once,   R        01/28/21 1258              ROS   UTA 2/2 pt intubated and sedated  Past History   No past medical history on file. No past surgical history on file. No family history on file. Social History   Socioeconomic History  . Marital status: Legally Separated    Spouse name: Not on file  . Number of children: Not on file  . Years of education: Not on file  . Highest education level: Not on file  Occupational History  . Not on file  Tobacco Use  . Smoking status: Every Day    Types: Cigarettes  . Smokeless tobacco: Never  Substance and Sexual Activity  . Alcohol use: Yes  . Drug use: Yes    Types: Cocaine  . Sexual activity: Not Currently  Other Topics Concern  . Not on file  Social History Narrative  . Not on file   Social Determinants of Health   Financial Resource Strain: Not on file  Food Insecurity: Not on file  Transportation Needs: Not on file  Physical Activity: Not on file  Stress: Not on file  Social Connections: Not on file   Allergies  Allergen Reactions  . Gabapentin Other (See Comments)    Stomach cramps  . Haldol [Haloperidol]  Other (See Comments)    Dystonia  . Naloxone Swelling    Tongue swelling  . Prednisone Swelling    Pharyngeal swelling  . Suboxone [Buprenorphine Hcl-Naloxone Hcl] Swelling    Tongue swelling  . Lurasidone Anxiety  . Ziprasidone Hives    Medications   Medications Prior to Admission  Medication Sig Dispense Refill Last Dose  . bictegravir-emtricitabine-tenofovir AF (BIKTARVY) 50-200-25 MG TABS tablet Take 1 tablet by mouth daily.     . mirtazapine (REMERON) 30 MG tablet Take 1 tablet (30 mg total) by mouth at  bedtime. 30 tablet 0      Vitals   Vitals:   01/28/21 1301 01/28/21 1315 01/28/21 1330 01/28/21 1400  BP: (!) 109/95 (!) 83/42 (!) 89/62 97/73  Pulse: (!) 109 (!) 115 (!) 102 96  Resp: _0 Temp: (!) 100.6 F (38.1 C) (!) 100.6 F (38.1 C) (!) 100.7 F (38.2 C)   TempSrc:      SpO2: 100% 100% 100% 99%     There is no height or weight on file to calculate BMI.  Physical Exam   Physical Exam Gen: intubated and sedated HEENT: Atraumatic, normocephalic;mucous membranes moist; oropharynx clear, tongue without atrophy or fasciculations. Neck: Supple, trachea midline. Resp: ventilated CV: tachycardic NSR Abd: soft/NT/ND; nabs x 4 quad Extrem: Nml bulk; no cyanosis, clubbing, or edema.  Neuro: *MS: sedated. No response to noxious stimuli *Speech: intubated *CN: PERRL 38m, (+) oculocephalics, corneals, cough, gag *Motor & sensory: no response to noxious stimuli in any extremity *Coordination:  UTA *Reflexes:  1+ and symmetric throughout without clonus; toes mute bilat *Gait: deferred   Labs   CBC:  Recent Labs  Lab 01/28/21 1102 01/28/21 1125 01/28/21 1320 01/28/21 1324  WBC 21.7*  --   --  21.0*  NEUTROABS 15.6*  --   --   --   HGB 13.1   < > 13.9 13.6  HCT 42.6   < > 41.0 44.8  MCV 87.7  --   --  88.5  PLT 416*  --   --  250   < > = values in this interval not displayed.    Basic Metabolic Panel:  Lab Results  Component Value Date    NA 139 01/28/2021   K >7.5 (HH) 01/28/2021   CO2 20 (L) 01/28/2021   GLUCOSE 139 (H) 01/28/2021   BUN 14 01/28/2021   CREATININE 3.08 (H) 01/28/2021   CALCIUM 6.4 (LL) 01/28/2021   GFRNONAA 27 (L) 01/28/2021   Lipid Panel:  Lab Results  Component Value Date   LDLCALC 69 01/18/2021   HgbA1c:  Lab Results  Component Value Date   HGBA1C 5.7 (H) 01/18/2021   Urine Drug Screen:     Component Value Date/Time   LABOPIA POSITIVE (A) 01/28/2021 1215   COCAINSCRNUR NONE DETECTED 01/28/2021 1215   LABBENZ NONE DETECTED 01/28/2021 1215   AMPHETMU NONE DETECTED 01/28/2021 1215   THCU NONE DETECTED 01/28/2021 1215   LABBARB NONE DETECTED 01/28/2021 1215    Alcohol Level     Component Value Date/Time   ETH <10 01/28/2021 1102     Impression   31yo patient with hx HIV, polysubstance abuse, PTSD, major depression found unresponsive then developed witnessed seizure activity in ED and was intubated for airway protection. He received 435mkg keppra load and sedation with versed. Initial EEG recording showed no electrographic seizures; EEG will be continued overnight. Due to his fever and leukocytosis their is concern for CNS infection, as well as drug overdose given history of polysubstance abuse.  Recommendations   - Pt admitted to ICU under CCM - Continue keppra 100017mid - LP with studies listed above - Empiric CNS coverage with vanc/cefepime/acyclovir/ampicillin - Extended drugs of abuse panel sent to [facility] - MRI brain with and without contrast when able to do so - Seizure precautions - Continuous EEG  Will continue to follow. ______________________________________________________________________  This patient is critically ill and at significant risk of neurological worsening, death and care requires constant monitoring of vital signs, hemodynamics,respiratory and cardiac  monitoring, neurological assessment, discussion with family, other specialists and medical decision  making of high complexity. I spent 90 minutes of neurocritical care time  in the care of  this patient. This was time spent independent of any time provided by nurse practitioner or PA.  Su Monks, MD [facility] [phone]  If 7pm- 7am, please page neurology on call as listed in [place].    Thank you for the opportunity to take part in the care of this patient. If you have any further questions, please contact the neurology consultation attending.  Signed,  Su Monks, MD [facility] [phone]  If 7pm- 7am, please page neurology on call as listed in [place].

## 2021-01-28 NOTE — Procedures (Addendum)
Central Venous Catheter Insertion Procedure Note  Howard Rowland  [id number]  [birth year]  Date:01/28/21  Time:3:45 PM   Provider Performing:Augusta Mirkin D. Harris   Procedure: Insertion of Non-tunneled Central Venous Catheter(36556)with US guidance (40981)    Indication(s) Hemodialysis  Consent Unable to obtain consent due to emergent nature of procedure.  Anesthesia Topical only with 1% lidocaine   Timeout Verified patient identification, verified procedure, site/side was marked, verified correct patient position, special equipment/implants available, medications/allergies/relevant history reviewed, required imaging and test results available.  Sterile Technique Maximal sterile technique including full sterile barrier drape, hand hygiene, sterile gown, sterile gloves, mask, hair covering, sterile ultrasound probe cover (if used).  Procedure Description Area of catheter insertion was cleaned with chlorhexidine and draped in sterile fashion.   With real-time ultrasound guidance a HD catheter was placed into the right internal jugular vein.  Nonpulsatile blood flow and easy flushing noted in all ports.  The catheter was sutured in place and sterile dressing applied.  Complications/Tolerance None; patient tolerated the procedure well. Chest X-ray is ordered to verify placement for internal jugular or subclavian cannulation.  Chest x-ray is not ordered for femoral cannulation.  EBL Minimal  Specimen(s) None  Howard Rowland D. Tiburcio Pea, NP-C [facility] Personal contact information can be found on Amion  01/28/2021, 3:46 PM

## 2021-01-28 NOTE — Progress Notes (Signed)
Patient critically ill throughout shift today, pressors titrated by verbal orders obtained by Janeann Forehand NP.

## 2021-01-28 NOTE — Procedures (Addendum)
Patient Name: Howard Rowland  MRN: [id number]  Epilepsy Attending: Charlsie Quest  Referring Physician/Provider: Hart Rochester, NP Date: 01/28/2021 Duration: 22.21 mins  Patient history: 30yo M with ams. EEG to evaluate for seizure  Level of alertness: comatose  AEDs during EEG study: LEV, versed  Technical aspects: This EEG study was done with scalp electrodes positioned according to the 10-20 International system of electrode placement. Electrical activity was acquired at a sampling rate of 500Hz  and reviewed with a high frequency filter of 70Hz  and a low frequency filter of 1Hz . EEG data were recorded continuously and digitally stored.   Description: EEG showed continuous generalized 3 to 6 Hz theta-delta slowing admixed an excessive amount of 15 to 18 Hz beta activity with irregular morphology distributed symmetrically and diffusely.  Hyperventilation and photic stimulation were not performed.     ABNORMALITY - Continuous slow, generalized - Excessive beta, generalized  IMPRESSION: This study is suggestive of moderate to severe diffuse encephalopathy, nonspecific etiology but likely related to sedation. The excessive beta activity seen in the background is most likely due to the effect of benzodiazepine and is a benign EEG pattern. No seizures or epileptiform discharges were seen throughout the recording.   Tiffanye Hartmann 

## 2021-01-28 NOTE — Procedures (Signed)
Central Venous Catheter Insertion Procedure Note  Howard Rowland  [id number]  [birth year]  Date:01/28/21  Time:2:54 PM   Provider Performing:Juandiego Kolenovic Judie Petit Thora Lance   Procedure: Insertion of Non-tunneled Central Venous Catheter(36556) with US guidance (06301)   Indication(s) Medication administration  Consent Unable to obtain consent due to emergent nature of procedure.  Anesthesia Topical only with 1% lidocaine   Timeout Verified patient identification, verified procedure, site/side was marked, verified correct patient position, special equipment/implants available, medications/allergies/relevant history reviewed, required imaging and test results available.  Sterile Technique Maximal sterile technique including full sterile barrier drape, hand hygiene, sterile gown, sterile gloves, mask, hair covering, sterile ultrasound probe cover (if used).  Procedure Description Area of catheter insertion was cleaned with chlorhexidine and draped in sterile fashion.  With real-time ultrasound guidance a central venous catheter was placed into the left subclavian vein. Nonpulsatile blood flow and easy flushing noted in all ports.  The catheter was sutured in place and sterile dressing applied.     Complications/Tolerance None; patient tolerated the procedure well. Chest X-ray is ordered to verify placement for internal jugular or subclavian cannulation.   Chest x-ray is not ordered for femoral cannulation.  EBL Minimal  Specimen(s) None

## 2021-01-28 NOTE — ED Triage Notes (Addendum)
Pt BIB [facility] for unresponsiveness. Pt was found sitting in a chair by a bystander who stated pt was unresponsive so they gave narcan and called 911. When fire arrived pt had decreased respirations and was given another mg of narcan. EMS arrived gave another 0.5 of narcan. Pt's last BP was 56/38 per EMS.

## 2021-01-28 NOTE — Procedures (Signed)
Arterial Catheter Insertion Procedure Note  Howard Rowland  [id number]  Oct 06, 1990  Date:01/28/21  Time:4:33 PM    Provider Performing: Alphonzo Lemmings D. Harris    Procedure: Insertion of Arterial Line (26948) with US guidance (54627)   Indication(s) Blood pressure monitoring and/or need for frequent ABGs  Consent Unable to obtain consent due to emergent nature of procedure.  Anesthesia None   Time Out Verified patient identification, verified procedure, site/side was marked, verified correct patient position, special equipment/implants available, medications/allergies/relevant history reviewed, required imaging and test results available.   Sterile Technique Maximal sterile technique including full sterile barrier drape, hand hygiene, sterile gown, sterile gloves, mask, hair covering, sterile ultrasound probe cover (if used).   Procedure Description Area of catheter insertion was cleaned with chlorhexidine and draped in sterile fashion. With real-time ultrasound guidance an arterial catheter was placed into the right femoral artery.  Appropriate arterial tracings confirmed on monitor.     Complications/Tolerance None; patient tolerated the procedure well.   EBL Minimal   Specimen(s) None   Howard Rowland D. Tiburcio Pea, NP-C [facility] Personal contact information can be found on Amion  01/28/2021, 4:34 PM

## 2021-01-28 NOTE — Progress Notes (Signed)
Pharmacy Antibiotic Note  Howard Rowland is a 30 y.o. male admitted on 01/28/2021 with  r/o herpes encephalitis/menigitis .  Pharmacy has been consulted for acyclovir, cefepime and vancomycin dosing.  -the 1500mg  IV vancomycin load has not been given  Plans are to start CRRT tonight  Plan: -Change acyclovir to 700mg  IV q24h -Vancomycin 1500mg  IV x1 followed by 750mg  IV q24h -Will enter orders when CRRT starts -Continue cefepime 2gm IV Q12h -Will follow renal function, cultures and clinical progress      Temp (24hrs), Avg:100.1 F (37.8 C), Min:97.6 F (36.4 C), Max:100.7 F (38.2 C)  Recent Labs  Lab 01/28/21 1102 01/28/21 1125 01/28/21 1324 01/28/21 1333  WBC 21.7*  --  21.0*  --   CREATININE 3.09* 2.90* 3.08*  --   LATICACIDVEN  --   --   --  4.9*     Estimated Creatinine Clearance: 33.9 mL/min (A) (by C-G formula based on SCr of 3.08 mg/dL (H)).    Allergies  Allergen Reactions   Gabapentin Other (See Comments)    Stomach cramps   Haldol [Haloperidol] Other (See Comments)    Dystonia   Naloxone Swelling and Other (See Comments)    Tongue swelling   Prednisone Swelling and Other (See Comments)    Pharyngeal swelling   Suboxone [Buprenorphine Hcl-Naloxone Hcl] Swelling and Other (See Comments)    Tongue swelling   Lurasidone Anxiety   Ziprasidone Hives    Antimicrobials this admission: Cefepime 8/30 >>  Vancomycin 8/30 >>  Acyclovir 8/30 >>   Dose adjustments this admission:   Microbiology results: 8/30 BCx:  8/30 UCx:   8/30 CMV PCR >> 8/30 EBV >>  8/30 CSF >>  8/30 HSV>>   Thank you for allowing pharmacy to be a part of this patient's care. 9/30, PharmD Clinical Pharmacist **Pharmacist phone directory can now be found on [email or web address] ([email or web address]).  Listed under [facility].

## 2021-01-28 NOTE — ED Notes (Signed)
Prop drip stopped due to hypotension.

## 2021-01-28 NOTE — Progress Notes (Signed)
EEG complete - results pending 

## 2021-01-29 ENCOUNTER — Inpatient Hospital Stay (HOSPITAL_COMMUNITY): Payer: No Typology Code available for payment source

## 2021-01-29 DIAGNOSIS — J9601 Acute respiratory failure with hypoxia: Secondary | ICD-10-CM

## 2021-01-29 DIAGNOSIS — G40901 Epilepsy, unspecified, not intractable, with status epilepticus: Secondary | ICD-10-CM

## 2021-01-29 DIAGNOSIS — B20 Human immunodeficiency virus [HIV] disease: Secondary | ICD-10-CM

## 2021-01-29 DIAGNOSIS — G9341 Metabolic encephalopathy: Secondary | ICD-10-CM | POA: Diagnosis not present

## 2021-01-29 DIAGNOSIS — R778 Other specified abnormalities of plasma proteins: Secondary | ICD-10-CM

## 2021-01-29 DIAGNOSIS — J939 Pneumothorax, unspecified: Secondary | ICD-10-CM | POA: Diagnosis not present

## 2021-01-29 DIAGNOSIS — J9602 Acute respiratory failure with hypercapnia: Secondary | ICD-10-CM

## 2021-01-29 DIAGNOSIS — R4182 Altered mental status, unspecified: Secondary | ICD-10-CM

## 2021-01-29 DIAGNOSIS — R579 Shock, unspecified: Secondary | ICD-10-CM | POA: Diagnosis not present

## 2021-01-29 LAB — POCT ACTIVATED CLOTTING TIME
Activated Clotting Time: 138 seconds
Activated Clotting Time: 161 seconds
Activated Clotting Time: 161 seconds
Activated Clotting Time: 167 seconds
Activated Clotting Time: 167 seconds
Activated Clotting Time: 173 seconds
Activated Clotting Time: 179 seconds
Activated Clotting Time: 185 seconds

## 2021-01-29 LAB — HIV-1/2 AB - DIFFERENTIATION
HIV 1 Ab: REACTIVE — AB
HIV 2 Ab: NONREACTIVE

## 2021-01-29 LAB — RESPIRATORY PANEL BY PCR

## 2021-01-29 LAB — BASIC METABOLIC PANEL
Anion gap: 11 (ref 5–15)
BUN: 19 mg/dL (ref 6–20)
CO2: 17 mmol/L — ABNORMAL LOW (ref 22–32)
Calcium: 6.5 mg/dL — ABNORMAL LOW (ref 8.9–10.3)
Chloride: 113 mmol/L — ABNORMAL HIGH (ref 98–111)
Creatinine, Ser: 2.73 mg/dL — ABNORMAL HIGH (ref 0.61–1.24)
GFR, Estimated: 32 mL/min — ABNORMAL LOW (ref >60)
Glucose, Bld: 155 mg/dL — ABNORMAL HIGH (ref 70–99)
Potassium: 5.3 mmol/L — ABNORMAL HIGH (ref 3.5–5.1)
Sodium: 141 mmol/L (ref 135–145)

## 2021-01-29 LAB — POCT I-STAT 7, (LYTES, BLD GAS, ICA,H+H)
Acid-base deficit: 5 mmol/L — ABNORMAL HIGH (ref 0.0–2.0)
Bicarbonate: 18.1 mmol/L — ABNORMAL LOW (ref 20.0–28.0)
Calcium, Ion: 0.93 mmol/L — ABNORMAL LOW (ref 1.15–1.40)
HCT: 38 % — ABNORMAL LOW (ref 39.0–52.0)
Hemoglobin: 12.9 g/dL — ABNORMAL LOW (ref 13.0–17.0)
O2 Saturation: 99 %
Patient temperature: 36.5
Potassium: 4 mmol/L (ref 3.5–5.1)
Sodium: 143 mmol/L (ref 135–145)
TCO2: 19 mmol/L — ABNORMAL LOW (ref 22–32)
pCO2 arterial: 26.3 mmHg — ABNORMAL LOW (ref 32.0–48.0)
pH, Arterial: 7.444 (ref 7.350–7.450)
pO2, Arterial: 121 mmHg — ABNORMAL HIGH (ref 83.0–108.0)

## 2021-01-29 LAB — RENAL FUNCTION PANEL
Albumin: 3 g/dL — ABNORMAL LOW (ref 3.5–5.0)
Albumin: 2.9 g/dL — ABNORMAL LOW (ref 3.5–5.0)
Anion gap: 19 — ABNORMAL HIGH (ref 5–15)
Anion gap: 11 (ref 5–15)
BUN: 18 mg/dL (ref 6–20)
BUN: 17 mg/dL (ref 6–20)
CO2: 21 mmol/L — ABNORMAL LOW (ref 22–32)
CO2: 26 mmol/L (ref 22–32)
Calcium: 7.3 mg/dL — ABNORMAL LOW (ref 8.9–10.3)
Calcium: 7.8 mg/dL — ABNORMAL LOW (ref 8.9–10.3)
Chloride: 101 mmol/L (ref 98–111)
Chloride: 101 mmol/L (ref 98–111)
Creatinine, Ser: 2.26 mg/dL — ABNORMAL HIGH (ref 0.61–1.24)
Creatinine, Ser: 1.42 mg/dL — ABNORMAL HIGH (ref 0.61–1.24)
GFR, Estimated: 40 mL/min — ABNORMAL LOW (ref >60)
GFR, Estimated: >60 mL/min (ref >60)
Glucose, Bld: 199 mg/dL — ABNORMAL HIGH (ref 70–99)
Glucose, Bld: 124 mg/dL — ABNORMAL HIGH (ref 70–99)
Phosphorus: 3.8 mg/dL (ref 2.5–4.6)
Phosphorus: 3.2 mg/dL (ref 2.5–4.6)
Potassium: 4.4 mmol/L (ref 3.5–5.1)
Potassium: 3.4 mmol/L — ABNORMAL LOW (ref 3.5–5.1)
Sodium: 141 mmol/L (ref 135–145)
Sodium: 138 mmol/L (ref 135–145)

## 2021-01-29 LAB — MISC LABCORP TEST (SEND OUT): Labcorp test code: 139835

## 2021-01-29 LAB — ECHOCARDIOGRAM COMPLETE
Area-P 1/2: 4.06 cm2
Height: 68 in
S' Lateral: 3 cm
Weight: 2515.01 oz

## 2021-01-29 LAB — CBC
HCT: 39.3 % (ref 39.0–52.0)
Hemoglobin: 13 g/dL (ref 13.0–17.0)
MCH: 27.2 pg (ref 26.0–34.0)
MCHC: 33.1 g/dL (ref 30.0–36.0)
MCV: 82.2 fL (ref 80.0–100.0)
Platelets: 194 10*3/uL (ref 150–400)
RBC: 4.78 MIL/uL (ref 4.22–5.81)
RDW: 14.7 % (ref 11.5–15.5)
WBC: 16.2 10*3/uL — ABNORMAL HIGH (ref 4.0–10.5)
nRBC: 0 % (ref 0.0–0.2)

## 2021-01-29 LAB — MAGNESIUM: Magnesium: 1.8 mg/dL (ref 1.7–2.4)

## 2021-01-29 LAB — GLUCOSE, CAPILLARY
Glucose-Capillary: 46 mg/dL — ABNORMAL LOW (ref 70–99)
Glucose-Capillary: 274 mg/dL — ABNORMAL HIGH (ref 70–99)
Glucose-Capillary: 170 mg/dL — ABNORMAL HIGH (ref 70–99)
Glucose-Capillary: 169 mg/dL — ABNORMAL HIGH (ref 70–99)
Glucose-Capillary: 139 mg/dL — ABNORMAL HIGH (ref 70–99)
Glucose-Capillary: 141 mg/dL — ABNORMAL HIGH (ref 70–99)
Glucose-Capillary: 147 mg/dL — ABNORMAL HIGH (ref 70–99)
Glucose-Capillary: 126 mg/dL — ABNORMAL HIGH (ref 70–99)

## 2021-01-29 LAB — COOXEMETRY PANEL
Carboxyhemoglobin: 0.8 % (ref 0.5–1.5)
Carboxyhemoglobin: 0.8 % (ref 0.5–1.5)
Methemoglobin: 0.7 % (ref 0.0–1.5)
Methemoglobin: 0.7 % (ref 0.0–1.5)
O2 Saturation: 56.4 %
O2 Saturation: 98.4 %
Total hemoglobin: 13.1 g/dL (ref 12.0–16.0)
Total hemoglobin: 12.5 g/dL (ref 12.0–16.0)

## 2021-01-29 LAB — T-HELPER CELLS (CD4) COUNT (NOT AT ARMC)
CD4 % Helper T Cell: 25 % — ABNORMAL LOW (ref 33–65)
CD4 T Cell Abs: 707 /uL (ref 400–1790)

## 2021-01-29 LAB — LACTIC ACID, PLASMA: Lactic Acid, Venous: 4.8 mmol/L (ref 0.5–1.9)

## 2021-01-29 LAB — HEPATITIS C ANTIBODY: HCV Ab: NONREACTIVE

## 2021-01-29 LAB — APTT: aPTT: 36 seconds (ref 24–36)

## 2021-01-29 MED ORDER — DOLUTEGRAVIR SODIUM 50 MG PO TABS
50.0000 mg | ORAL_TABLET | Freq: Every day | ORAL | Status: DC
  Administered 2021-01-29: 50 mg
  Filled 2021-01-29 (×2): qty 1

## 2021-01-29 MED ORDER — HEPARIN SODIUM (PORCINE) 1000 UNIT/ML DIALYSIS
1000.0000 [IU] | INTRAMUSCULAR | Status: DC | PRN
Start: 2021-01-29 — End: 2021-01-31
  Administered 2021-01-30: 3000 [IU] via INTRAVENOUS_CENTRAL
  Administered 2021-01-31: 2800 [IU] via INTRAVENOUS_CENTRAL
  Filled 2021-01-29 (×4): qty 6

## 2021-01-29 MED ORDER — FENTANYL 2500MCG IN NS 250ML (10MCG/ML) PREMIX INFUSION
0.0000 ug/h | INTRAVENOUS | Status: DC
  Administered 2021-01-29: 100 ug/h via INTRAVENOUS
  Administered 2021-01-30: 300 ug/h via INTRAVENOUS
  Administered 2021-01-30: 250 ug/h via INTRAVENOUS
  Administered 2021-01-30: 300 ug/h via INTRAVENOUS
  Administered 2021-01-31 (×2): 400 ug/h via INTRAVENOUS
  Filled 2021-01-29 (×6): qty 250

## 2021-01-29 MED ORDER — DEXTROSE 50 % IV SOLN
25.0000 g | INTRAVENOUS | Status: AC
  Administered 2021-01-29: 25 g via INTRAVENOUS

## 2021-01-29 MED ORDER — SODIUM CHLORIDE 0.9 % IV SOLN: INTRAVENOUS | Status: DC | PRN

## 2021-01-29 MED ORDER — CALCIUM GLUCONATE-NACL 1-0.675 GM/50ML-% IV SOLN
1.0000 g | Freq: Once | INTRAVENOUS | Status: DC
  Filled 2021-01-29: qty 50

## 2021-01-29 MED ORDER — ALTEPLASE 2 MG IJ SOLR
2.0000 mg | Freq: Once | INTRAMUSCULAR | Status: AC
  Administered 2021-01-29: 1.4 mg
  Filled 2021-01-29 (×2): qty 2

## 2021-01-29 MED ORDER — SODIUM CHLORIDE 0.9% FLUSH: 10.0000 mL | INTRAVENOUS | Status: DC | PRN

## 2021-01-29 MED ORDER — HEPARIN BOLUS VIA INFUSION (CRRT)
1000.0000 [IU] | INTRAVENOUS | Status: DC | PRN
  Administered 2021-01-29: 1000 [IU] via INTRAVENOUS_CENTRAL
  Filled 2021-01-29: qty 1000

## 2021-01-29 MED ORDER — CALCIUM GLUCONATE-NACL 1-0.675 GM/50ML-% IV SOLN
1.0000 g | Freq: Once | INTRAVENOUS | Status: AC
  Administered 2021-01-29: 1000 mg via INTRAVENOUS
  Filled 2021-01-29: qty 50

## 2021-01-29 MED ORDER — PRISMASOL BGK 4/2.5 32-4-2.5 MEQ/L REPLACEMENT SOLN
Status: DC
  Filled 2021-01-29 (×4): qty 5000

## 2021-01-29 MED ORDER — SODIUM CHLORIDE 0.9 % IV SOLN
3.0000 g | Freq: Three times a day (TID) | INTRAVENOUS | Status: DC
  Administered 2021-01-29 – 2021-01-31 (×6): 3 g via INTRAVENOUS
  Filled 2021-01-29 (×6): qty 8

## 2021-01-29 MED ORDER — EMTRICITABINE-TENOFOVIR AF 200-25 MG PO TABS
1.0000 | ORAL_TABLET | Freq: Every day | ORAL | Status: DC
  Administered 2021-01-29: 1
  Filled 2021-01-29 (×2): qty 1

## 2021-01-29 MED ORDER — DEXTROSE 50 % IV SOLN
INTRAVENOUS | Status: AC
  Filled 2021-01-29: qty 50

## 2021-01-29 MED ORDER — VITAL 1.5 CAL PO LIQD
1000.0000 mL | ORAL | Status: DC
  Administered 2021-01-29 – 2021-01-30 (×2): 1000 mL

## 2021-01-29 MED ORDER — PROSOURCE TF PO LIQD
90.0000 mL | Freq: Three times a day (TID) | ORAL | Status: DC
  Administered 2021-01-29 – 2021-01-30 (×5): 90 mL
  Filled 2021-01-29 (×5): qty 90

## 2021-01-29 MED ORDER — SODIUM CHLORIDE 0.9 % IV SOLN
250.0000 [IU]/h | INTRAVENOUS | Status: DC
  Administered 2021-01-29: 1200 [IU]/h via INTRAVENOUS_CENTRAL
  Administered 2021-01-29: 1000 [IU]/h via INTRAVENOUS_CENTRAL
  Administered 2021-01-30: 250 [IU]/h via INTRAVENOUS_CENTRAL
  Filled 2021-01-29 (×5): qty 2

## 2021-01-29 MED ORDER — PRISMASOL BGK 4/2.5 32-4-2.5 MEQ/L EC SOLN
Status: DC
  Filled 2021-01-29 (×16): qty 5000

## 2021-01-29 MED ORDER — MIDAZOLAM HCL 2 MG/2ML IJ SOLN
1.0000 mg | INTRAMUSCULAR | Status: AC | PRN
  Administered 2021-01-29 – 2021-01-30 (×9): 2 mg via INTRAVENOUS
  Filled 2021-01-29 (×12): qty 2

## 2021-01-29 NOTE — Procedures (Signed)
Central Venous Catheter Insertion Procedure Note  Howard Rowland  [id number]  07-01-1990  Date:01/29/21  Time:10:31 AM   Provider Performing:Zadia Uhde   Procedure: Insertion of Non-tunneled Central Venous [id number]) with US guidance (64332)   Indication(s) Medication administration  Consent Unable to obtain consent due to inability to find a medical decision maker for patient.  All reasonable efforts were made.  Another independent medical provider,   , confirmed the benefits of this procedure outweigh the risks.  Anesthesia Topical only with 1% lidocaine   Timeout Verified patient identification, verified procedure, site/side was marked, verified correct patient position, special equipment/implants available, medications/allergies/relevant history reviewed, required imaging and test results available.  Sterile Technique Maximal sterile technique including full sterile barrier drape, hand hygiene, sterile gown, sterile gloves, mask, hair covering, sterile ultrasound probe cover (if used).  Procedure Description Area of catheter insertion was cleaned with chlorhexidine and draped in sterile fashion.  With real-time ultrasound guidance a central venous catheter was placed into the left internal jugular vein. Nonpulsatile blood flow and easy flushing noted in all ports.  The catheter was sutured in place and sterile dressing applied.  Complications/Tolerance None; patient tolerated the procedure well. Chest X-ray is ordered to verify placement for internal jugular or subclavian cannulation.   Chest x-ray is not ordered for femoral cannulation.  EBL Minimal  Specimen(s) None

## 2021-01-29 NOTE — Procedures (Signed)
Insertion of Chest Tube Procedure Note  Howard Rowland  [id number]  11-May-1991  Date:01/29/21  Time:1:04 PM    Provider Performing: Lidia Collum   Procedure: Chest Tube Insertion [phone])  Indication(s) Pneumothorax  Consent Risks of the procedure as well as the alternatives and risks of each were explained to the patient and/or caregiver.  Consent for the procedure was obtained and is signed in the bedside chart  Anesthesia Topical only with 1% lidocaine    Time Out Verified patient identification, verified procedure, site/side was marked, verified correct patient position, special equipment/implants available, medications/allergies/relevant history reviewed, required imaging and test results available.   Sterile Technique Maximal sterile technique including full sterile barrier drape, hand hygiene, sterile gown, sterile gloves, mask, hair covering, sterile ultrasound probe cover (if used).   Procedure Description Ultrasound not used to identify appropriate pleural anatomy for placement and overlying skin marked. Area of placement cleaned and draped in sterile fashion.  A 14 French pigtail pleural catheter was placed into the left pleural space using Seldinger technique. Appropriate return of air was obtained.  The tube was connected to atrium and placed on -20 cm H2O wall suction.   Complications/Tolerance None; patient tolerated the procedure well. Chest X-ray is ordered to verify placement.   EBL Minimal  Specimen(s) none  [facility] 01/29/2021, 1:06 PM  Please see [email or web address] for pager details.  From 7A-7P if no response, please call [phone]. After hours, please call ELink [phone].

## 2021-01-29 NOTE — Progress Notes (Signed)
Initial Nutrition Assessment  DOCUMENTATION CODES:   Not applicable  INTERVENTION:   Initiate tube feeding via OG tube after bronchoscopy today: Vital 1.5 at 55 ml/h (1320 ml per day) Prosource TF 90 ml TID  Provides 2220 kcal, 155 gm protein, 1008 ml free water daily.  NUTRITION DIAGNOSIS:   Increased nutrient needs related to acute illness (Renal failure requiring CRRT) as evidenced by estimated needs.  GOAL:   Patient will meet greater than or equal to 90% of their needs  MONITOR:   Vent status, TF tolerance, Labs  REASON FOR ASSESSMENT:   Ventilator    ASSESSMENT:   30 yo male admitted after being found unresponsive in a hotel room, worrisome for narcotic overdose. Required intubation in th ER and noted to have multi-organ failure. PMH includes MDD, polysubstance abuse, PTSD.  Discussed patient in ICU rounds and with RN today.  Okay to start tube feeding today. Plans for bronchoscopy today. Started on CRRT 8/30, ongoing today. OG tube in place, currently to LIS.  Patient is currently intubated on ventilator support MV: 13.5 L/min Temp (24hrs), Avg:98.5 F (36.9 C), Min:95.7 F (35.4 C), Max:101.7 F (38.7 C)   Labs reviewed.  CBG: 170-169  Medications reviewed and include vasopressin, levophed, precedex, keppra, solucortef, protonix.   Weight history reviewed. Weight is up by 5.5 kg in the past 2 weeks. Increase in weight likely related to fluid status.  I/O +1.2 L since admission UOP 1550 ml x 24 hours  NUTRITION - FOCUSED PHYSICAL EXAM:  Deferred per RN request d/t agitation  Diet Order:   Diet Order             Diet NPO time specified  Diet effective now                   EDUCATION NEEDS:   Not appropriate for education at this time  Skin:  Skin Assessment: Reviewed RN Assessment  Last BM:  no BM documented  Height:   Ht Readings from Last 1 Encounters:  01/28/21 5\' 8"  (1.727 m)    Weight:   Wt Readings from Last 1  Encounters:  01/29/21 71.3 kg    Ideal Body Weight:  70 kg  BMI:  Body mass index is 23.9 kg/m.  Estimated Nutritional Needs:   Kcal:  2260  Protein:  >/= 140 gm  Fluid:  2.2 L   2261, RD, LDN, CNSC Please refer to Amion for contact information.

## 2021-01-29 NOTE — Progress Notes (Signed)
Patient ID: Howard Rowland, male   DOB: 1991/05/20, 30 y.o.   MRN: [id number] S:Had issues with CRRT due to line clotting.  Catheter given activase with improved function. Pt was seen and examined while on CRRT. O:BP 98/65 (BP Location: Left Arm)   Pulse 70   Temp (!) 96.4 F (35.8 C)   Resp (!) 24   Ht _0  (1.727 m)   Wt 71.3 kg   SpO2 97%   BMI 23.90 kg/m   Intake/Output Summary (Last 24 hours) at 01/29/2021 0902 Last data filed at 01/29/2021 0900 Gross per 24 hour  Intake 3259.67 ml  Output 1953 ml  Net 1306.67 ml   Intake/Output: I/O last 3 completed shifts: In: 3083.4 [I.V.:918.5; IV Piggyback:2164.9] Out: 1950 [Urine:1550; Emesis/NG output:400]  Intake/Output this shift:  Total I/O In: 176.3 [I.V.:56.3; Other:20; IV Piggyback:100] Out: 3 [Other:3] Weight change:  VZS:MOLMBEMLJ and sedated CVS:RRR Resp: ventilated BS bilaterally Abd:+BS, soft, NT Ext: no edema  Recent Labs  Lab 01/28/21 1102 01/28/21 1125 01/28/21 1144 01/28/21 1320 01/28/21 1324 01/28/21 2244 01/29/21 0444 01/29/21 0458  NA 144 142 140 139 139 141 143 141  K 7.3* 7.0* 7.3* 7.3* >7.5* 5.3* 4.0 4.4  CL 110 108  --   --  112* 113*  --  101  CO2 24  --   --   --  20* 17*  --  21*  GLUCOSE 90 90  --   --  139* 155*  --  199*  BUN 12 13  --   --  14 19  --  18  CREATININE 3.09* 2.90*  --   --  3.08* 2.73*  --  2.26*  ALBUMIN 3.7  --   --   --  2.9*  --   --  3.0*  CALCIUM 7.2*  --   --   --  6.4* 6.5*  --  7.3*  PHOS  --   --   --   --  5.1*  --   --  3.8  AST 103*  --   --   --  129*  --   --   --   ALT 117*  --   --   --  106*  --   --   --    Liver Function Tests: Recent Labs  Lab 01/28/21 1102 01/28/21 1324 01/29/21 0458  AST 103* 129*  --   ALT 117* 106*  --   ALKPHOS 71 56  --   BILITOT 0.7 0.9  --   PROT 6.3* 4.8*  --   ALBUMIN 3.7 2.9* 3.0*   Recent Labs  Lab 01/28/21 1324  LIPASE 22  AMYLASE 537*   Recent Labs  Lab 01/28/21 1116  AMMONIA 75*   CBC: Recent Labs   Lab 01/28/21 1102 01/28/21 1125 01/28/21 1324 01/29/21 0444 01/29/21 0458  WBC 21.7*  --  21.0*  --  16.2*  NEUTROABS 15.6*  --   --   --   --   HGB 13.1   < > 13.6 12.9* 13.0  HCT 42.6   < > 44.8 38.0* 39.3  MCV 87.7  --  88.5  --  82.2  PLT 416*  --  250  --  194   < > = values in this interval not displayed.   Cardiac Enzymes: Recent Labs  Lab 01/28/21 1324  CKTOTAL 16,288*   CBG: Recent Labs  Lab 01/28/21 2228 01/29/21 4492 01/29/21 0247 01/29/21 0454 01/29/21 0100  GLUCAP 149* 46* 274* 170* 169*    Iron Studies: No results for input(s): IRON, TIBC, TRANSFERRIN, FERRITIN in the last 72 hours. Studies/Results: DG Abd 1 View  Result Date: 01/28/2021 CLINICAL DATA:  Orogastric tube placement. EXAM: ABDOMEN - 1 VIEW COMPARISON:  Same day. FINDINGS: The bowel gas pattern is normal. Distal tip of nasogastric tube is seen in proximal stomach. No radio-opaque calculi or other significant radiographic abnormality are seen. IMPRESSION: Distal tip of nasogastric tube is seen in proximal stomach. Electronically Signed   By: Marijo Conception M.D.   On: 01/28/2021 16:18   DG Abdomen 1 View  Result Date: 01/28/2021 CLINICAL DATA:  Check gastric catheter placement EXAM: ABDOMEN - 1 VIEW COMPARISON:  None. FINDINGS: Gastric catheter has been advanced following the chest x-ray with the tip in the stomach. The proximal side port lies in the distal esophagus however and should be advanced deeper into the stomach. IMPRESSION: Gastric catheter as described. This should be advanced deeper into the stomach. Electronically Signed   By: Inez Catalina M.D.   On: 01/28/2021 12:09   CT HEAD WO CONTRAST (5MM)  Result Date: 01/28/2021 CLINICAL DATA:  Seizure, nontraumatic EXAM: CT HEAD WITHOUT CONTRAST TECHNIQUE: Contiguous axial images were obtained from the base of the skull through the vertex without intravenous contrast. COMPARISON:  None. FINDINGS: Brain: There is no acute intracranial  hemorrhage, mass effect, or edema. Gray-white differentiation is preserved. There is no extra-axial fluid collection. Ventricles and sulci are within normal limits in size and configuration. Vascular: No hyperdense vessel or unexpected calcification. Skull: Calvarium is unremarkable. Sinuses/Orbits: Lobular right maxillary sinus mucosal thickening. Other: Mastoid air cells are clear. Age-indeterminate left nasal bone fracture. IMPRESSION: No acute intracranial abnormality. Age-indeterminate left nasal bone fracture. Electronically Signed   By: Macy Mis M.D.   On: 01/28/2021 11:46   CT Angio Chest PE W and/or Wo Contrast  Result Date: 01/28/2021 CLINICAL DATA:  Found unresponsive EXAM: CT ANGIOGRAPHY CHEST WITH CONTRAST TECHNIQUE: Multidetector CT imaging of the chest was performed using the standard protocol during bolus administration of intravenous contrast. Multiplanar CT image reconstructions and MIPs were obtained to evaluate the vascular anatomy. CONTRAST:  68m OMNIPAQUE IOHEXOL 350 MG/ML SOLN COMPARISON:  Chest x-ray from earlier in the same day. FINDINGS: Cardiovascular: Thoracic aorta is not significantly dilated although opacification is significantly limited. Pulmonary artery shows a normal branching pattern bilaterally. No definitive filling defects are identified to suggest pulmonary embolism. Mediastinum/Nodes: Thoracic inlet is within normal limits. There are changes consistent with mediastinal and hilar adenopathy similar to that seen on recent chest x-ray. The right paratracheal adenopathy measures 2.1 cm in short axis. AP window node measures 15 mm in short axis. The hilar adenopathy is confluent and significantly enlarged in size. Subcarinal adenopathy is difficult to evaluate due to lack of contrast within the left atrium. The esophagus as visualized is within normal limits. Scattered small axillary nodes are noted. Endotracheal tube is noted 2 cm above the carina. Gastric catheter  extends the stomach. Lungs/Pleura: Lungs are well aerated bilaterally with the exception of the lower lobes which demonstrate mild-to-moderate consolidation right slightly greater than left. No sizable effusion is noted. Some patchy infiltrative changes are noted in the right middle and right upper lobe but to a lesser degree. Upper Abdomen: Visualized upper abdomen shows the spleen to be within normal limits. No definitive adenopathy is noted. Musculoskeletal: No acute bony abnormality is noted. Review of the MIP images confirms the above findings. IMPRESSION:  No evidence of pulmonary emboli. Bilateral lower lobe as well as right upper and middle lobe infiltrative densities consistent with multifocal pneumonia. Considerable bilateral hilar and mediastinal adenopathy. These changes could be related to underlying sarcoidosis although lymphoma deserves consideration as well. Tissue sampling should be considered. Electronically Signed   By: Inez Catalina M.D.   On: 01/28/2021 13:39   CT Cervical Spine Wo Contrast  Result Date: 01/28/2021 CLINICAL DATA:  Head trauma, minor, normal mental status; seizure EXAM: CT CERVICAL SPINE WITHOUT CONTRAST TECHNIQUE: Multidetector CT imaging of the cervical spine was performed without intravenous contrast. Multiplanar CT image reconstructions were also generated. COMPARISON:  None. FINDINGS: Alignment: No significant listhesis. Skull base and vertebrae: No acute fracture. Vertebral body heights are maintained. Soft tissues and spinal canal: No prevertebral fluid or swelling. No visible canal hematoma. Disc levels:  Intervertebral disc heights are maintained. Upper chest: Unremarkable Other: None. IMPRESSION: No acute cervical spine fracture. Electronically Signed   By: Macy Mis M.D.   On: 01/28/2021 11:48   DG CHEST PORT 1 VIEW  Result Date: 01/28/2021 CLINICAL DATA:  Central line placement. EXAM: PORTABLE CHEST 1 VIEW COMPARISON:  January 28, 2021. FINDINGS: Stable  cardiomediastinal silhouette. Endotracheal and nasogastric tubes are unchanged in position. Interval placement of right internal jugular catheter is noted with distal tip in expected position of SVC. Interval placement of left subclavian catheter with distal tip in expected position of SVC. No pneumothorax is noted. Left lung is clear. Mild right basilar atelectasis or infiltrate is noted. Bony thorax is unremarkable. IMPRESSION: Interval placement of bilateral internal jugular catheters with tips in SVC. No pneumothorax is noted. Increased right basilar opacity is noted concerning for pneumonia or atelectasis. Electronically Signed   By: Marijo Conception M.D.   On: 01/28/2021 16:17   DG Chest Portable 1 View  Result Date: 01/28/2021 CLINICAL DATA:  Check endotracheal tube placement EXAM: PORTABLE CHEST 1 VIEW COMPARISON:  None. FINDINGS: Cardiac shadow is within normal limits. Endotracheal tube is noted in satisfactory position. Gastric catheter extends into the distal esophagus. This should be advanced several cm deeper into the stomach. Lungs are well aerated bilaterally. Fullness in the right paratracheal region and hila bilaterally is noted consistent with lymphadenopathy. No focal infiltrate or effusion is noted. IMPRESSION: Tubes and lines as described above. Gastric catheter should be advanced several cm deeper into the stomach. Changes consistent with lymphadenopathy in the right paratracheal region and bilateral hila. This may represent underlying sarcoidosis although CT of the chest with contrast is recommended for further evaluation. Electronically Signed   By: Inez Catalina M.D.   On: 01/28/2021 12:07   EEG adult  Result Date: 01/28/2021 Howard Havens, MD     01/28/2021  4:37 PM Patient Name: Howard Rowland MRN: [id number] Epilepsy Attending: Lora Rowland Referring Physician/Provider: Asa Saunas, NP Date: 01/28/2021 Duration: 22.21 mins Patient history: 30yo M with ams. EEG to evaluate for  seizure Level of alertness: comatose AEDs during EEG study: LEV, versed Technical aspects: This EEG study was done with scalp electrodes positioned according to the 10-20 International system of electrode placement. Electrical activity was acquired at a sampling rate of _0  and reviewed with a high frequency filter of _1  and a low frequency filter of _2 . EEG data were recorded continuously and digitally stored. Description: EEG showed continuous generalized 3 to 6 Hz theta-delta slowing admixed an excessive amount of 15 to 18 Hz beta activity with irregular morphology distributed symmetrically and diffusely.  Hyperventilation  and photic stimulation were not performed.   ABNORMALITY - Continuous slow, generalized - Excessive beta, generalized IMPRESSION: This study is suggestive of moderate to severe diffuse encephalopathy, nonspecific etiology but likely related to sedation. The excessive beta activity seen in the background is most likely due to the effect of benzodiazepine and is a benign EEG pattern. No seizures or epileptiform discharges were seen throughout the recording. Howard Rowland   Overnight EEG with video  Result Date: 01/29/2021 Howard Havens, MD     01/29/2021  8:34 AM Patient Name: Howard Rowland MRN: [id number] Epilepsy Attending: Lora Rowland Referring Physician/Provider: Dr Su Monks Duration: 01/28/2021 1421 to 01/29/2021 0830  Patient history: 30yo M with ams. EEG to evaluate for seizure  Level of alertness: lethargic/sedated  AEDs during EEG study: LEV  Technical aspects: This EEG study was done with scalp electrodes positioned according to the 10-20 International system of electrode placement. Electrical activity was acquired at a sampling rate of _0  and reviewed with a high frequency filter of _1  and a low frequency filter of _2 . EEG data were recorded continuously and digitally stored.  Description: EEG showed continuous generalized polymorphic sharply contoured 3 to  6 Hz theta-delta slowing. Hyperventilation and photic stimulation were not performed.    ABNORMALITY - Continuous slow, generalized  IMPRESSION: This study is suggestive of moderate diffuse encephalopathy, nonspecific etiology. No seizures or epileptiform discharges were seen throughout the recording.   Howard Rowland    chlorhexidine gluconate (MEDLINE KIT)  15 mL Mouth Rinse BID   Chlorhexidine Gluconate Cloth  6 each Topical Daily   Chlorhexidine Gluconate Cloth  6 each Topical Daily   hydrocortisone sod succinate (SOLU-CORTEF) inj  100 mg Intravenous Q8H   mouth rinse  15 mL Mouth Rinse 10 times per day   pantoprazole (PROTONIX) IV  40 mg Intravenous Q12H   sodium chloride flush  10-40 mL Intracatheter Q12H    BMET    Component Value Date/Time   NA 141 01/29/2021 0458   K 4.4 01/29/2021 0458   CL 101 01/29/2021 0458   CO2 21 (L) 01/29/2021 0458   GLUCOSE 199 (H) 01/29/2021 0458   BUN 18 01/29/2021 0458   CREATININE 2.26 (H) 01/29/2021 0458   CALCIUM 7.3 (L) 01/29/2021 0458   GFRNONAA 40 (L) 01/29/2021 0458   CBC    Component Value Date/Time   WBC 16.2 (H) 01/29/2021 0458   RBC 4.78 01/29/2021 0458   HGB 13.0 01/29/2021 0458   HCT 39.3 01/29/2021 0458   PLT 194 01/29/2021 0458   MCV 82.2 01/29/2021 0458   MCH 27.2 01/29/2021 0458   MCHC 33.1 01/29/2021 0458   RDW 14.7 01/29/2021 0458   LYMPHSABS 2.7 01/28/2021 1102   MONOABS 2.8 (H) 01/28/2021 1102   EOSABS 0.0 01/28/2021 1102   BASOSABS 0.1 01/28/2021 1102      Assessment/Plan:  AKI - presumably due to combination of rhabdomyolysis and ischemic ATN in setting of shock, however toxicology studies are pending.   CRRT started 01/28/21 after RIJ trialysis catheter placed by PCCM 2K/2.5Ca dialysate at 1500 ml/hr, 0K/2.5Ca pre-filter replacement fluid at 500 ml/hr, isotonic bicarb post-filter at 200 ml/hr Heparin for anticoagulation but will change to full heparin and not fixed dose. Keep even for now given hypotension  and pressors. Acute hypoxic respiratory failure with hypercapnia - s/p intubation, management per PCCM Shock - unclear if sepsis vs cardiogenic vs combination - s/p LP to r/o meningitis and started on ampicillin, cefepime, and vancomycin.  Currently on levophed and dobutamine per PCCM.  Will need ECHO.  CT angio negative for PE. Pulmonary lymphadenopathy - workup per PCCM, possible bronch. Hyperkalemia - due to #1. Improved with CVVHD . Acute metabolic encephalopathy - s/p LP, Neuro consulted.  EEG with nonspecific moderate to severe diffuse encephalopathy.  No seizure activity seen.  For MRI of brain when more stable. HIV - was on BIKTARVY but compliance was sporadic. Non anion gap metabolic acidosis - toxicology studies pending and will use bicarbonate with CVVHD.    Donetta Potts, MD [employer] [phone]

## 2021-01-29 NOTE — Procedures (Addendum)
Patient Name: Howard Rowland  MRN: [id number]  Epilepsy Attending: Charlsie Quest  Referring Physician/Provider: Dr Bing Neighbors Duration: 01/28/2021 1421 to 01/29/2021 1421   Patient history: 30yo M with ams. EEG to evaluate for seizure   Level of alertness: lethargic/sedated   AEDs during EEG study: LEV   Technical aspects: This EEG study was done with scalp electrodes positioned according to the 10-20 International system of electrode placement. Electrical activity was acquired at a sampling rate of 500Hz  and reviewed with a high frequency filter of 70Hz  and a low frequency filter of 1Hz . EEG data were recorded continuously and digitally stored.    Description: EEG showed continuous generalized polymorphic sharply contoured 3 to 6 Hz theta-delta slowing. Hyperventilation and photic stimulation were not performed.      ABNORMALITY - Continuous slow, generalized   IMPRESSION: This study is suggestive of moderate diffuse encephalopathy, nonspecific etiology. No seizures or epileptiform discharges were seen throughout the recording.     Omarii Scalzo 
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
## 2021-01-29 NOTE — Progress Notes (Addendum)
eLink Physician-Brief Progress Note Patient Name: Howard Rowland DOB: [birth year] MRN: [id number]   Date of Service  01/29/2021  HPI/Events of Note  ABG result reviewed.  eICU Interventions  Respiratory rate reduced to 24.         Thomasene Lot Gaston Dase 01/29/2021, 6:23 AM

## 2021-01-29 NOTE — Progress Notes (Signed)
RT note. Elink notified of AM abg

## 2021-01-29 NOTE — Progress Notes (Signed)
Attending:    Subjective: 30 y/o male who has been in and out of the heatlh care system largely due to psychiatric problems lately.  Yesterday found down in a hotel room, worrisome for narcotic overdose.  In the ER, noted to have multi-organ failure, required intubation for airway protection.  PCCM consulted for admission. After admission brought to the ICU, started on CRRT.  Had an LP.    Overnight required restraints due to him attempting to self extubated.  Started on versed pushes  Convulsive movement in ER, EEG overnight > no seizure    PMH MDD Poly substance abuse PTSD HIV  Objective: Vitals:   01/29/21 0815 01/29/21 0830 01/29/21 0845 01/29/21 0900  BP:    98/71  Pulse: 67 68 70 74  Resp: (!) 24 (!) 24 (!) 24 (!) 24  Temp: (!) 96.1 F (35.6 C) (!) 96.3 F (35.7 C) (!) 96.4 F (35.8 C) (!) 96.6 F (35.9 C)  TempSrc:    Bladder  SpO2: 99% 98% 97% 97%  Weight:      Height:       Vent Mode: PRVC FiO2 (%):  [40 %-100 %] 50 % Set Rate:  [20 bmp-28 bmp] 24 bmp Vt Set:  [540 mL] 540 mL PEEP:  [5 cmH20] 5 cmH20 Plateau Pressure:  [19 cmH20-23 cmH20] 19 cmH20  Intake/Output Summary (Last 24 hours) at 01/29/2021 2841 Last data filed at 01/29/2021 0900 Gross per 24 hour  Intake 3259.67 ml  Output 1975 ml  Net 1284.67 ml    General:  In bed on vent HENT: NCAT ETT in place PULM: CTA B, vent supported breathing CV: RRR, no mgr GI: BS+, soft, nontender MSK: normal bulk and tone Neuro: sedated on vent    CBC    Component Value Date/Time   WBC 16.2 (H) 01/29/2021 0458   RBC 4.78 01/29/2021 0458   HGB 13.0 01/29/2021 0458   HCT 39.3 01/29/2021 0458   PLT 194 01/29/2021 0458   MCV 82.2 01/29/2021 0458   MCH 27.2 01/29/2021 0458   MCHC 33.1 01/29/2021 0458   RDW 14.7 01/29/2021 0458   LYMPHSABS 2.7 01/28/2021 1102   MONOABS 2.8 (H) 01/28/2021 1102   EOSABS 0.0 01/28/2021 1102   BASOSABS 0.1 01/28/2021 1102    BMET    Component Value Date/Time   NA 141  01/29/2021 0458   K 4.4 01/29/2021 0458   CL 101 01/29/2021 0458   CO2 21 (L) 01/29/2021 0458   GLUCOSE 199 (H) 01/29/2021 0458   BUN 18 01/29/2021 0458   CREATININE 2.26 (H) 01/29/2021 0458   CALCIUM 7.3 (L) 01/29/2021 0458   GFRNONAA 40 (L) 01/29/2021 0458    CXR images hazy right lower lobe infiltrate, ett in place  Impression/Plan: Acute metabolic encephalopathy : minimize sedation, LP is re-assuring; MRI brain   Acute respiratory failure with hypoxemia> currently on minimal ventilator settings, intermittently agitated, not following commands; rest   Shock, sepsis vs cardiogenic: echo pending, coox low, continue levophed, vasopressin today, repeat coox  Presumed adrenal insufficiency: continue solu-cortef  Sepsis: suspect pneumonia, could this be opportunistic, continue broad spectrum antibiotics, bronchoscopy today for BAL to help identify organism  AKI, rhabdomyolysis > continue CRRT, IV fluids  HIV > follow up viral load; ask ID to see him today  Polysubstance abuse, narcotic use > counsel to quit  Demand cardiac ischemia > follow up echocardiogram today  My cc time 35 minutes  Heber , MD [place] [facility] Pager: [phone] Cell: [phone] After  7pm: [phone]

## 2021-01-29 NOTE — Progress Notes (Signed)
NAME:  Howard SarinDeshawn Rundell, MRN:  [id number], DOB:  [birth year], LOS: 1 ADMISSION DATE:  01/28/2021, CONSULTATION DATE:  01/28/2021 REFERRING MD:  Dr. Karene FryLawsing (ED), CHIEF COMPLAINT:  Unresponsive resulting in intubation    History of Present Illness:  Howard Rowland is a 30 y.o. male with a PMH significant for MDD, PTSD, and polysubstance abuse who presented to the ED after being found unresponsive in hotel room. Per report patient was found unresponsive by bystander who then administered narcan with no improvement so EMS was called. On EMS arrival patient was found unresponsive with decreased respirations and severe hypotension.   On ED arrival patient was seen with seizure like activity and obtunded mental status prompting intubation for airway protection. Vitals pertinent for tachycardia and hypotension. Lab work significant for K 7.3, creatinine 3.09, AST 103, ALT 117, HS troponin 179, WBC 21.7. CT spine and head negative. CXR with lymphadenopathy in the right paratracheal region and bilateral hila. PCCM consulted for further management and admission   Pertinent  Medical History  MDD, PTSD, and polysubstance abuse  Significant Hospital Events:  8/30 admitted after being found unresponsive   Interim History / Subjective:  Sedated, intubated, and minimally interactive Objective   Blood pressure 122/85, pulse 69, temperature (!) 95.9 F (35.5 C), resp. rate (!) 24, height 5\' 8"  (1.727 m), weight 71.3 kg, SpO2 99 %.    Vent Mode: PRVC FiO2 (%):  [40 %-100 %] 50 % Set Rate:  [20 bmp-28 bmp] 24 bmp Vt Set:  [540 mL] 540 mL PEEP:  [5 cmH20] 5 cmH20 Plateau Pressure:  [19 cmH20-23 cmH20] 19 cmH20   Intake/Output Summary (Last 24 hours) at 01/29/2021 0803 Last data filed at 01/29/2021 0700 Gross per 24 hour  Intake 3083.41 ml  Output 1950 ml  Net 1133.41 ml   Filed Weights   01/29/21 0500  Weight: 71.3 kg    Examination: Physical Exam Constitutional:      Appearance: He is ill-appearing.  He is not diaphoretic.     Comments: Sedated, intubated  Cardiovascular:     Rate and Rhythm: Normal rate and regular rhythm.     Pulses: Normal pulses.     Heart sounds: Normal heart sounds. No murmur heard.   No friction rub. No gallop.  Pulmonary:     Breath sounds: No wheezing, rhonchi or rales.     Comments: Intubated, saturating at 98% Abdominal:     General: Abdomen is flat. Bowel sounds are normal.     Palpations: Abdomen is soft.     Tenderness: There is no abdominal tenderness. There is no guarding.  Musculoskeletal:     Right lower leg: No edema.     Left lower leg: No edema.  Skin:    General: Skin is warm.     Findings: No rash.  Neurological:     Comments: sedated    Resolved Hospital Problem list   Hyperkalemia  Assessment & Plan:  Acute Hypoxic / Hypercapnic Respiratory Failure  UDS + for  opiates, did receive narcan when first found. Unable to follow commands when sedated, becomes agitated, likely in setting of multifocal pneumonia.  - Trend intermittent chest x-ray and ABG.   - AT/SBT as tolerated - Follow cultures  - VAP bundle in place  - PAD protocol, RAS 0 to -2  Acute Metabolic Encephalopathy  EEG Negative, Strep pneumo negative, cryptococcal Ag negative,  BC NGTD, LP results show colorless appearance of CSF, RBC 100,  protein level of 14, Glucose of  100, negative CSF culture stain making bacterial meningitis less likely.  - Obtain MRI Brain - FU enterovirus, HSV 1/2 Ab, JC virus, Legionella, CMV, EBV, CD4, HIV-1/12, - BC show NGTD - Appreciate neurology's recommendations  Shock; Sepsis vs Cardiogenic Shock vs Mixed Etiology  Likely 2/2 to aspiration pneumonia. Coox panel showing 56.4 favoring cardiogenic shock vs sepsis, but given leukocytosis, fevers at admission procal 3.02, may have a sepsis component as well.   - Etiology includes possible aspiration PNA vs meningitis vs atypical infections in setting of HIV - BAL today, will need to further  clarify POA, marital status, or family member to sign for consent. - Will reach out to ID for further recommendations - F/U ECHO  - Continue , acyclovir, cefepime, and vancomycin  - Continue Levophed/ vasopressin, wean as tolerate with goal of MAP >65.  - Repeat Coox - Trend Lactic - Follow up labs as above  AKI with concern for ATN in the setting of shock  Hyperkalemia  Rhabdomyolysis sCr improved this AM to 2.25, BUN 18, and GFR 40. K this AM 4.4. CK level 16,000 on CRRT.  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 We discussed a bronchoscopy in the near future, Sonny Masters will bring their marriage certificate and sign for consent when she visits Mr. Blizzard.   Labs   CBC: Recent Labs  Lab 01/28/21 1102 01/28/21 1125 01/28/21 1144 01/28/21 1320 01/28/21 1324 01/29/21 0444 01/29/21 0458  WBC 21.7*  --   --   --  21.0*  --  16.2*  NEUTROABS 15.6*  --   --   --   --   --   --   HGB 13.1   < > 13.3 13.9 13.6 12.9* 13.0  HCT 42.6   < > 39.0 41.0 44.8 38.0* 39.3  MCV 87.7  --   --   --  88.5  --  82.2  PLT 416*  --   --   --  250  --  194   < > = values in this interval not displayed.   Basic Metabolic Panel: Recent Labs  Lab 01/28/21 1102 01/28/21 1125 01/28/21 1144 01/28/21 1320 01/28/21 1324 01/28/21 2244 01/29/21 0444 01/29/21 0458  NA 144 142   < > 139 139 141 143 141  K 7.3* 7.0*   < >  7.3* >7.5* 5.3* 4.0 4.4  CL 110 108  --   --  112* 113*  --  101  CO2 24  --   --   --  20* 17*  --  21*  GLUCOSE 90 90  --   --  139* 155*  --  199*  BUN 12 13  --   --  14 19  --  18  CREATININE 3.09* 2.90*  --   --  3.08* 2.73*  --  2.26*  CALCIUM 7.2*  --   --   --  6.4* 6.5*  --  7.3*  MG  --   --   --   --  2.0  --   --  1.8  PHOS  --   --   --   --  5.1*  --   --  3.8   < > = values in this interval not displayed.   GFR: Estimated Creatinine Clearance: 47.1 mL/min (A) (by C-G formula based on SCr of 2.26 mg/dL (H)). Recent Labs  Lab 01/28/21 1102 01/28/21 1324 01/28/21 1333 01/28/21 2049 01/29/21 0458  PROCALCITON  --  3.02  --   --   --   WBC 21.7* 21.0*  --   --  16.2*  LATICACIDVEN  --   --  4.9* 4.1*  --    Liver Function Tests: Recent Labs  Lab 01/28/21 1102 01/28/21 1324 01/29/21 0458  AST 103* 129*  --   ALT 117* 106*  --   ALKPHOS 71 56  --   BILITOT 0.7 0.9  --   PROT 6.3* 4.8*  --   ALBUMIN 3.7 2.9* 3.0*   Recent Labs  Lab 01/28/21 1324  LIPASE 22  AMYLASE 537*   Recent Labs  Lab 01/28/21 1116  AMMONIA 75*    ABG    Component Value Date/Time   PHART 7.444 01/29/2021 0444   PCO2ART 26.3 (L) 01/29/2021 0444   PO2ART 121 (H) 01/29/2021 0444   HCO3 18.1 (L) 01/29/2021 0444   TCO2 19 (L) 01/29/2021 0444   ACIDBASEDEF 5.0 (H) 01/29/2021 0444   O2SAT 56.4 01/29/2021 0450     Coagulation Profile: Recent Labs  Lab 01/28/21 1324  INR 1.2    Cardiac Enzymes: Recent Labs  Lab 01/28/21 1324  CKTOTAL 16,288*    HbA1C: Hgb A1c MFr Bld  Date/Time Value Ref Range Status  01/18/2021 08:14 AM 5.7 (H) 4.8 - 5.6 % Final    Comment:    (NOTE) Pre diabetes:          5.7%-6.4%  Diabetes:              >6.4%  Glycemic control for   <7.0% adults with diabetes     CBG: Recent Labs  Lab 01/28/21 2228 01/29/21 0218 01/29/21 0247 01/29/21 0454 01/29/21 0713  GLUCAP 149* 46* 274* 170* 169*    Review of Systems:   Unable to assess    Past Medical History:  He,  has no past medical history on file.   Surgical History:  No past surgical history on file.   Social History:   reports that he has been smoking cigarettes. He has never used smokeless tobacco. He reports current alcohol use. He reports current drug use. Drug: Cocaine.   Family History:  His family history is not on file.   Allergies Allergies  Allergen Reactions   Gabapentin Other (See Comments)    Stomach cramps   Haldol [Haloperidol] Other (See Comments)    Dystonia   Naloxone Swelling and Other (See Comments)    Tongue swelling   Prednisone Swelling and Other (See Comments)    Pharyngeal swelling   Suboxone [Buprenorphine Hcl-Naloxone Hcl] Swelling and Other (See Comments)    Tongue swelling   Lurasidone Anxiety   Ziprasidone Hives     Home Medications  Prior to Admission medications   Medication Sig Start Date End Date Taking? Authorizing Provider  bictegravir-emtricitabine-tenofovir AF (BIKTARVY) 50-200-25 MG TABS tablet Take 1 tablet by mouth daily. 11/28/20   [provider]  mirtazapine (REMERON) 30 MG tablet Take  1 tablet (30 mg total) by mouth at bedtime. 01/27/21   Carlyn Reichert, MD     Dolan Amen, MD IMT[facility], PGY-3 01/29/2021,8:03 AM

## 2021-01-29 NOTE — Progress Notes (Signed)
LTM leads checked and imped adjusted to 5 ohms. EKG, F4, F8, A2, P4, FP1 all reapplied and all leads good. Skin check= no breakdown seen. Charged.
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
## 2021-01-29 NOTE — Progress Notes (Signed)
eLink Physician-Brief Progress Note Patient Name: Howard Rowland DOB: 01-28-1991 MRN: [id number]   Date of Service  01/29/2021  HPI/Events of Note  Patient became quite agitated, tried to sit up in bed, and was tugging at his restraints.  eICU Interventions  PRN Versed added for optimal sedation, target RAS is 0 to -2.     Intervention Category Minor Interventions: Agitation / anxiety - evaluation and management  Migdalia Dk 01/29/2021, 6:34 AM

## 2021-01-29 NOTE — Progress Notes (Signed)
Pharmacy Antibiotic Note  Jari Dipasquale is a 30 y.o. male admitted on 01/28/2021 with  r/o herpes encephalitis/menigitis .  Pharmacy has been consulted for acyclovir, amp/sul and vancomycin dosing. WBC elevated. SCr 2.9 (BL ~ 1.1). Tm 100.62F Patient now of CRRT  Plan: -Continue acyclovir 10 mg/kg (671 mg) Q 24 hours. -Vancomycin 750mg  q24hr  -Amp/sul 3gm q8hr -Monitor CBC, renal fx, cultures and clinical progress -Will monitor vanco PK at steady state. -Consider de-escalation of therapy as more lab results return   Height: 5\' 8"  (172.7 cm) Weight: 71.3 kg (157 lb 3 oz) IBW/kg (Calculated) : 68.4  Temp (24hrs), Avg:98.2 F (36.8 C), Min:95.7 F (35.4 C), Max:101.7 F (38.7 C)  Recent Labs  Lab 01/28/21 1102 01/28/21 1125 01/28/21 1324 01/28/21 1333 01/28/21 2049 01/28/21 2244 01/29/21 0458 01/29/21 1002  WBC 21.7*  --  21.0*  --   --   --  16.2*  --   CREATININE 3.09* 2.90* 3.08*  --   --  2.73* 2.26*  --   LATICACIDVEN  --   --   --  4.9* 4.1*  --   --  4.8*     Estimated Creatinine Clearance: 47.1 mL/min (A) (by C-G formula based on SCr of 2.26 mg/dL (H)).    Allergies  Allergen Reactions   Gabapentin Other (See Comments)    Stomach cramps   Haldol [Haloperidol] Other (See Comments)    Dystonia   Naloxone Swelling and Other (See Comments)    Tongue swelling   Prednisone Swelling and Other (See Comments)    Pharyngeal swelling   Suboxone [Buprenorphine Hcl-Naloxone Hcl] Swelling and Other (See Comments)    Tongue swelling   Lurasidone Anxiety   Ziprasidone Hives    Antimicrobials this admission: Cefepime 8/30 >> 831 Vancomycin 8/30 >>  Acyclovir 8/30 >>  Amp/Sul 8/31>>  Dose adjustments this admission:   Microbiology results: 8/30 BCx:  8/30 UCx:   8/30 CMV PCR >> 8/30 EBV >>  8/30 CSF >>  8/30 HSV>>  8/30 Strep pneumo>> neg 8/30 Crypto>> neg  9/30, PharmD Clinical Pharmacist  Please check AMION for all [facility] Pharmacy numbers After  10:00 PM, call Main Pharmacy [phone]

## 2021-01-29 NOTE — Progress Notes (Signed)
[facility] PCCM  CXR notable for left pneumothorax Plan pigtail chest tube If repeat CXR shows resolution of pneumothorax, will remove the left subclavian central line which had been pulled out partially earlier today.  I updated his significant other Howard Rowland by phone. There is some question as to whether or not they are married because the patient's father indicated to [place] that they were not married.  Howard Rowland re-assures me they are married.  We have requested a copy of their marriage certificate.  Howard Greenwood, MD [place] [facility] Pager: [phone] Cell: [phone] After 7:00 pm call [place]ink  [phone]

## 2021-01-29 NOTE — Progress Notes (Signed)
[facility] PCCM  Several issues Echo with mitral valve thickening: plan await blood cultures, consider TEE when we have someone we feel we can get consent from Severe agitation pulling at tubes and lines: fentanyl infusion  [place], MD [place] [facility] Pager: [phone] Cell: [phone] After 7:00 pm call Elink  [phone]

## 2021-01-29 NOTE — Progress Notes (Addendum)
eLink Physician-Brief Progress Note Patient Name: Howard Rowland DOB: 12/19/1990 MRN: [id number]   Date of Service  01/29/2021  HPI/Events of Note  Restraints order is about to expire.  eICU Interventions  Restraints order renewed to prevent self-extubation.     Intervention Category Major Interventions: Respiratory failure - evaluation and management  Migdalia Dk 01/29/2021, 6:28 AM

## 2021-01-29 NOTE — Progress Notes (Signed)
  Echocardiogram 2D Echocardiogram has been performed.  Howard Rowland 01/29/2021, 10:14 AM

## 2021-01-30 ENCOUNTER — Inpatient Hospital Stay (HOSPITAL_COMMUNITY): Payer: No Typology Code available for payment source

## 2021-01-30 DIAGNOSIS — J939 Pneumothorax, unspecified: Secondary | ICD-10-CM | POA: Diagnosis not present

## 2021-01-30 DIAGNOSIS — T40601S Poisoning by unspecified narcotics, accidental (unintentional), sequela: Secondary | ICD-10-CM

## 2021-01-30 DIAGNOSIS — G9349 Other encephalopathy: Secondary | ICD-10-CM

## 2021-01-30 DIAGNOSIS — J9602 Acute respiratory failure with hypercapnia: Secondary | ICD-10-CM | POA: Diagnosis not present

## 2021-01-30 DIAGNOSIS — R579 Shock, unspecified: Secondary | ICD-10-CM | POA: Diagnosis not present

## 2021-01-30 DIAGNOSIS — G9341 Metabolic encephalopathy: Secondary | ICD-10-CM | POA: Diagnosis not present

## 2021-01-30 DIAGNOSIS — J9601 Acute respiratory failure with hypoxia: Secondary | ICD-10-CM | POA: Diagnosis not present

## 2021-01-30 LAB — RENAL FUNCTION PANEL
Albumin: 3 g/dL — ABNORMAL LOW (ref 3.5–5.0)
Albumin: 2.7 g/dL — ABNORMAL LOW (ref 3.5–5.0)
Anion gap: 9 (ref 5–15)
Anion gap: 8 (ref 5–15)
BUN: 20 mg/dL (ref 6–20)
BUN: 15 mg/dL (ref 6–20)
CO2: 24 mmol/L (ref 22–32)
CO2: 24 mmol/L (ref 22–32)
Calcium: 7.9 mg/dL — ABNORMAL LOW (ref 8.9–10.3)
Calcium: 7.6 mg/dL — ABNORMAL LOW (ref 8.9–10.3)
Chloride: 102 mmol/L (ref 98–111)
Chloride: 110 mmol/L (ref 98–111)
Creatinine, Ser: 1.09 mg/dL (ref 0.61–1.24)
Creatinine, Ser: 0.91 mg/dL (ref 0.61–1.24)
GFR, Estimated: >60 mL/min (ref >60)
GFR, Estimated: >60 mL/min (ref >60)
Glucose, Bld: 156 mg/dL — ABNORMAL HIGH (ref 70–99)
Glucose, Bld: 126 mg/dL — ABNORMAL HIGH (ref 70–99)
Phosphorus: 1.7 mg/dL — ABNORMAL LOW (ref 2.5–4.6)
Phosphorus: 1.6 mg/dL — ABNORMAL LOW (ref 2.5–4.6)
Potassium: 3.2 mmol/L — ABNORMAL LOW (ref 3.5–5.1)
Potassium: 3.1 mmol/L — ABNORMAL LOW (ref 3.5–5.1)
Sodium: 135 mmol/L (ref 135–145)
Sodium: 142 mmol/L (ref 135–145)

## 2021-01-30 LAB — GLUCOSE, CAPILLARY
Glucose-Capillary: 144 mg/dL — ABNORMAL HIGH (ref 70–99)
Glucose-Capillary: 147 mg/dL — ABNORMAL HIGH (ref 70–99)
Glucose-Capillary: 161 mg/dL — ABNORMAL HIGH (ref 70–99)
Glucose-Capillary: 123 mg/dL — ABNORMAL HIGH (ref 70–99)
Glucose-Capillary: 110 mg/dL — ABNORMAL HIGH (ref 70–99)
Glucose-Capillary: 112 mg/dL — ABNORMAL HIGH (ref 70–99)

## 2021-01-30 LAB — POCT ACTIVATED CLOTTING TIME
Activated Clotting Time: 179 seconds
Activated Clotting Time: 184 seconds
Activated Clotting Time: 184 seconds
Activated Clotting Time: 184 seconds
Activated Clotting Time: 190 seconds
Activated Clotting Time: 190 seconds
Activated Clotting Time: 196 seconds
Activated Clotting Time: 196 seconds
Activated Clotting Time: 202 seconds
Activated Clotting Time: 190 seconds
Activated Clotting Time: 150 seconds
Activated Clotting Time: 155 seconds
Activated Clotting Time: 156 seconds
Activated Clotting Time: 156 seconds
Activated Clotting Time: 156 seconds
Activated Clotting Time: 161 seconds
Activated Clotting Time: 167 seconds

## 2021-01-30 LAB — CBC
HCT: 33.5 % — ABNORMAL LOW (ref 39.0–52.0)
Hemoglobin: 11.3 g/dL — ABNORMAL LOW (ref 13.0–17.0)
MCH: 27 pg (ref 26.0–34.0)
MCHC: 33.7 g/dL (ref 30.0–36.0)
MCV: 80 fL (ref 80.0–100.0)
Platelets: 138 10*3/uL — ABNORMAL LOW (ref 150–400)
RBC: 4.19 MIL/uL — ABNORMAL LOW (ref 4.22–5.81)
RDW: 14.5 % (ref 11.5–15.5)
WBC: 11.6 10*3/uL — ABNORMAL HIGH (ref 4.0–10.5)
nRBC: 0 % (ref 0.0–0.2)

## 2021-01-30 LAB — HIV-1 RNA QUANT-NO REFLEX-BLD
HIV 1 RNA Quant: <20 copies/mL
LOG10 HIV-1 RNA: UNDETERMINED log10copy/mL

## 2021-01-30 LAB — LACTIC ACID, PLASMA: Lactic Acid, Venous: 1.1 mmol/L (ref 0.5–1.9)

## 2021-01-30 LAB — RPR: RPR Ser Ql: NONREACTIVE

## 2021-01-30 LAB — URINE CULTURE: Culture: NO GROWTH

## 2021-01-30 LAB — MRSA NEXT GEN BY PCR, NASAL: MRSA by PCR Next Gen: DETECTED — AB

## 2021-01-30 LAB — LEGIONELLA PNEUMOPHILA SEROGP 1 UR AG: L. pneumophila Serogp 1 Ur Ag: NEGATIVE

## 2021-01-30 LAB — MAGNESIUM: Magnesium: 2.4 mg/dL (ref 1.7–2.4)

## 2021-01-30 LAB — APTT: aPTT: 79 seconds — ABNORMAL HIGH (ref 24–36)

## 2021-01-30 LAB — CMV DNA BY PCR, QUALITATIVE: CMV DNA, Qual PCR: NEGATIVE

## 2021-01-30 MED ORDER — POLYETHYLENE GLYCOL 3350 17 G PO PACK
17.0000 g | PACK | Freq: Every day | ORAL | Status: DC
  Administered 2021-01-30: 17 g
  Filled 2021-01-30: qty 1

## 2021-01-30 MED ORDER — MIRTAZAPINE 15 MG PO TABS
45.0000 mg | ORAL_TABLET | Freq: Every day | ORAL | Status: DC
  Administered 2021-01-30: 45 mg
  Filled 2021-01-30: qty 3

## 2021-01-30 MED ORDER — PREGABALIN 75 MG PO CAPS
75.0000 mg | ORAL_CAPSULE | Freq: Two times a day (BID) | ORAL | Status: DC
  Administered 2021-01-30 (×2): 75 mg
  Filled 2021-01-30 (×2): qty 1

## 2021-01-30 MED ORDER — GADOBUTROL 1 MMOL/ML IV SOLN
5.0000 mL | Freq: Once | INTRAVENOUS | Status: AC | PRN
  Administered 2021-01-30: 5 mL via INTRAVENOUS

## 2021-01-30 MED ORDER — EMTRICITABINE-TENOFOVIR AF 200-25 MG PO TABS
1.0000 | ORAL_TABLET | Freq: Every day | ORAL | Status: DC
  Administered 2021-01-30: 1
  Filled 2021-01-30 (×2): qty 1

## 2021-01-30 MED ORDER — MIDAZOLAM HCL 2 MG/2ML IJ SOLN
1.0000 mg | INTRAMUSCULAR | Status: DC | PRN
  Administered 2021-01-30 – 2021-01-31 (×4): 2 mg via INTRAVENOUS
  Filled 2021-01-30 (×5): qty 2

## 2021-01-30 MED ORDER — HEPARIN SODIUM (PORCINE) 5000 UNIT/ML IJ SOLN
5000.0000 [IU] | Freq: Three times a day (TID) | INTRAMUSCULAR | Status: DC
  Administered 2021-01-30 – 2021-02-07 (×23): 5000 [IU] via SUBCUTANEOUS
  Filled 2021-01-30 (×26): qty 1

## 2021-01-30 MED ORDER — MIDAZOLAM HCL 2 MG/2ML IJ SOLN
4.0000 mg | INTRAMUSCULAR | Status: AC | PRN
  Administered 2021-01-30 (×2): 4 mg via INTRAVENOUS
  Filled 2021-01-30 (×3): qty 4

## 2021-01-30 MED ORDER — MIDAZOLAM HCL 2 MG/2ML IJ SOLN
4.0000 mg | Freq: Once | INTRAMUSCULAR | Status: AC
  Administered 2021-01-30: 4 mg via INTRAVENOUS

## 2021-01-30 MED ORDER — WHITE PETROLATUM EX OINT
TOPICAL_OINTMENT | CUTANEOUS | Status: AC
  Filled 2021-01-30: qty 28.35

## 2021-01-30 MED ORDER — NOREPINEPHRINE 16 MG/250ML-% IV SOLN
0.0000 ug/min | INTRAVENOUS | Status: DC
  Administered 2021-01-30: 2 ug/min via INTRAVENOUS

## 2021-01-30 MED ORDER — POTASSIUM CHLORIDE 20 MEQ PO PACK
20.0000 meq | PACK | Freq: Once | ORAL | Status: AC
  Administered 2021-01-30: 20 meq
  Filled 2021-01-30: qty 1

## 2021-01-30 MED ORDER — DOLUTEGRAVIR SODIUM 50 MG PO TABS
50.0000 mg | ORAL_TABLET | Freq: Every day | ORAL | Status: DC
  Administered 2021-01-30: 50 mg
  Filled 2021-01-30 (×2): qty 1

## 2021-01-30 MED ORDER — DOCUSATE SODIUM 50 MG/5ML PO LIQD
100.0000 mg | Freq: Two times a day (BID) | ORAL | Status: DC
  Administered 2021-01-30 (×2): 100 mg
  Filled 2021-01-30 (×2): qty 10

## 2021-01-30 MED ORDER — POTASSIUM PHOSPHATES 15 MMOLE/5ML IV SOLN
30.0000 mmol | Freq: Once | INTRAVENOUS | Status: AC
  Administered 2021-01-30: 30 mmol via INTRAVENOUS
  Filled 2021-01-30: qty 10

## 2021-01-30 NOTE — Procedures (Addendum)
Patient Name: Howard Rowland  MRN: [id number]  Epilepsy Attending: Charlsie Quest  Referring Physician/Provider: Dr Bing Neighbors Duration: 01/29/2021 1421 to 01/30/2021 1138   Patient history: 30yo M with ams. EEG to evaluate for seizure   Level of alertness: lethargic/sedated   AEDs during EEG study: LEV   Technical aspects: This EEG study was done with scalp electrodes positioned according to the 10-20 International system of electrode placement. Electrical activity was acquired at a sampling rate of 500Hz  and reviewed with a high frequency filter of 70Hz  and a low frequency filter of 1Hz . EEG data were recorded continuously and digitally stored.    Description: EEG showed continuous generalized 5 to 6 Hz theta delta slowing. Hyperventilation and photic stimulation were not performed.      ABNORMALITY - Continuous slow, generalized   IMPRESSION: This study is suggestive of moderate diffuse encephalopathy, nonspecific etiology. No seizures or epileptiform discharges were seen throughout the recording.     Rhett Najera 

## 2021-01-30 NOTE — Progress Notes (Signed)
Patient ID: Howard Rowland, male   DOB: [birth year], 30 y.o.   MRN: [id number] S: Pt developed hypokalemia last night and adjustments were made to CRRT as well as KPhos repletion by CCM.  Pt was seen and examined while on CVVHD and orders reviewed.  O:BP 109/73 (BP Location: Left Arm)   Pulse (!) 56   Temp (!) 97 F (36.1 C)   Resp (!) 24   Ht '5\' 8"'  (1.727 m)   Wt 71.7 kg   SpO2 100%   BMI 24.03 kg/m   Intake/Output Summary (Last 24 hours) at 01/30/2021 1037 Last data filed at 01/30/2021 1020 Gross per 24 hour  Intake 3762.45 ml  Output 4622 ml  Net -859.55 ml   Intake/Output: I/O last 3 completed shifts: In: 4290.2 [I.V.:1424.9; Other:20; NG/GT:1290.7; IV Piggyback:1554.6] Out: 5038 [Urine:2256; Emesis/NG output:150; UEKCM:0349]  Intake/Output this shift:  Total I/O In: 811.9 [I.V.:369.1; NG/GT:315; IV Piggyback:127.8] Out: 872 [Urine:910] Weight change: 0.4 kg ZPH:XTAVWPVXY and sedated IAX:KPVVZSMOLMB at 57 Resp:ventilated BS bilaterally Abd: +BS, soft Ext:no edema  Recent Labs  Lab 01/28/21 1102 01/28/21 1125 01/28/21 1144 01/28/21 1320 01/28/21 1324 01/28/21 2244 01/29/21 0444 01/29/21 0458 01/29/21 1631 01/30/21 0321  NA 144 142   < > 139 139 141 143 141 138 135  K 7.3* 7.0*   < > 7.3* >7.5* 5.3* 4.0 4.4 3.4* 3.2*  CL 110 108  --   --  112* 113*  --  101 101 102  CO2 24  --   --   --  20* 17*  --  21* 26 24  GLUCOSE 90 90  --   --  139* 155*  --  199* 124* 156*  BUN 12 13  --   --  14 19  --  '18 17 20  ' CREATININE 3.09* 2.90*  --   --  3.08* 2.73*  --  2.26* 1.42* 1.09  ALBUMIN 3.7  --   --   --  2.9*  --   --  3.0* 2.9* 3.0*  CALCIUM 7.2*  --   --   --  6.4* 6.5*  --  7.3* 7.8* 7.9*  PHOS  --   --   --   --  5.1*  --   --  3.8 3.2 1.7*  AST 103*  --   --   --  129*  --   --   --   --   --   ALT 117*  --   --   --  106*  --   --   --   --   --    < > = values in this interval not displayed.   Liver Function Tests: Recent Labs  Lab 01/28/21 1102  01/28/21 1324 01/29/21 0458 01/29/21 1631 01/30/21 0321  AST 103* 129*  --   --   --   ALT 117* 106*  --   --   --   ALKPHOS 71 56  --   --   --   BILITOT 0.7 0.9  --   --   --   PROT 6.3* 4.8*  --   --   --   ALBUMIN 3.7 2.9* 3.0* 2.9* 3.0*   Recent Labs  Lab 01/28/21 1324  LIPASE 22  AMYLASE 537*   Recent Labs  Lab 01/28/21 1116  AMMONIA 75*   CBC: Recent Labs  Lab 01/28/21 1102 01/28/21 1125 01/28/21 1324 01/29/21 0444 01/29/21 0458 01/30/21 0321  WBC 21.7*  --  21.0*  --  16.2* 11.6*  NEUTROABS 15.6*  --   --   --   --   --   HGB 13.1   < > 13.6 12.9* 13.0 11.3*  HCT 42.6   < > 44.8 38.0* 39.3 33.5*  MCV 87.7  --  88.5  --  82.2 80.0  PLT 416*  --  250  --  194 138*   < > = values in this interval not displayed.   Cardiac Enzymes: Recent Labs  Lab 01/28/21 1324  CKTOTAL 16,288*   CBG: Recent Labs  Lab 01/29/21 1523 01/29/21 2009 01/29/21 2310 01/30/21 0309 01/30/21 0728  GLUCAP 141* 147* 126* 144* 147*    Iron Studies: No results for input(s): IRON, TIBC, TRANSFERRIN, FERRITIN in the last 72 hours. Studies/Results: DG Abd 1 View  Result Date: 01/28/2021 CLINICAL DATA:  Orogastric tube placement. EXAM: ABDOMEN - 1 VIEW COMPARISON:  Same day. FINDINGS: The bowel gas pattern is normal. Distal tip of nasogastric tube is seen in proximal stomach. No radio-opaque calculi or other significant radiographic abnormality are seen. IMPRESSION: Distal tip of nasogastric tube is seen in proximal stomach. Electronically Signed   By: Marijo Conception M.D.   On: 01/28/2021 16:18   DG Abdomen 1 View  Result Date: 01/28/2021 CLINICAL DATA:  Check gastric catheter placement EXAM: ABDOMEN - 1 VIEW COMPARISON:  None. FINDINGS: Gastric catheter has been advanced following the chest x-ray with the tip in the stomach. The proximal side port lies in the distal esophagus however and should be advanced deeper into the stomach. IMPRESSION: Gastric catheter as described. This  should be advanced deeper into the stomach. Electronically Signed   By: Inez Catalina M.D.   On: 01/28/2021 12:09   CT HEAD WO CONTRAST (5MM)  Result Date: 01/28/2021 CLINICAL DATA:  Seizure, nontraumatic EXAM: CT HEAD WITHOUT CONTRAST TECHNIQUE: Contiguous axial images were obtained from the base of the skull through the vertex without intravenous contrast. COMPARISON:  None. FINDINGS: Brain: There is no acute intracranial hemorrhage, mass effect, or edema. Gray-white differentiation is preserved. There is no extra-axial fluid collection. Ventricles and sulci are within normal limits in size and configuration. Vascular: No hyperdense vessel or unexpected calcification. Skull: Calvarium is unremarkable. Sinuses/Orbits: Lobular right maxillary sinus mucosal thickening. Other: Mastoid air cells are clear. Age-indeterminate left nasal bone fracture. IMPRESSION: No acute intracranial abnormality. Age-indeterminate left nasal bone fracture. Electronically Signed   By: Macy Mis M.D.   On: 01/28/2021 11:46   CT Angio Chest PE W and/or Wo Contrast  Result Date: 01/28/2021 CLINICAL DATA:  Found unresponsive EXAM: CT ANGIOGRAPHY CHEST WITH CONTRAST TECHNIQUE: Multidetector CT imaging of the chest was performed using the standard protocol during bolus administration of intravenous contrast. Multiplanar CT image reconstructions and MIPs were obtained to evaluate the vascular anatomy. CONTRAST:  20m OMNIPAQUE IOHEXOL 350 MG/ML SOLN COMPARISON:  Chest x-ray from earlier in the same day. FINDINGS: Cardiovascular: Thoracic aorta is not significantly dilated although opacification is significantly limited. Pulmonary artery shows a normal branching pattern bilaterally. No definitive filling defects are identified to suggest pulmonary embolism. Mediastinum/Nodes: Thoracic inlet is within normal limits. There are changes consistent with mediastinal and hilar adenopathy similar to that seen on recent chest x-ray. The right  paratracheal adenopathy measures 2.1 cm in short axis. AP window node measures 15 mm in short axis. The hilar adenopathy is confluent and significantly enlarged in size. Subcarinal adenopathy is difficult to evaluate due to lack of contrast within  the left atrium. The esophagus as visualized is within normal limits. Scattered small axillary nodes are noted. Endotracheal tube is noted 2 cm above the carina. Gastric catheter extends the stomach. Lungs/Pleura: Lungs are well aerated bilaterally with the exception of the lower lobes which demonstrate mild-to-moderate consolidation right slightly greater than left. No sizable effusion is noted. Some patchy infiltrative changes are noted in the right middle and right upper lobe but to a lesser degree. Upper Abdomen: Visualized upper abdomen shows the spleen to be within normal limits. No definitive adenopathy is noted. Musculoskeletal: No acute bony abnormality is noted. Review of the MIP images confirms the above findings. IMPRESSION: No evidence of pulmonary emboli. Bilateral lower lobe as well as right upper and middle lobe infiltrative densities consistent with multifocal pneumonia. Considerable bilateral hilar and mediastinal adenopathy. These changes could be related to underlying sarcoidosis although lymphoma deserves consideration as well. Tissue sampling should be considered. Electronically Signed   By: Inez Catalina M.D.   On: 01/28/2021 13:39   CT Cervical Spine Wo Contrast  Result Date: 01/28/2021 CLINICAL DATA:  Head trauma, minor, normal mental status; seizure EXAM: CT CERVICAL SPINE WITHOUT CONTRAST TECHNIQUE: Multidetector CT imaging of the cervical spine was performed without intravenous contrast. Multiplanar CT image reconstructions were also generated. COMPARISON:  None. FINDINGS: Alignment: No significant listhesis. Skull base and vertebrae: No acute fracture. Vertebral body heights are maintained. Soft tissues and spinal canal: No prevertebral  fluid or swelling. No visible canal hematoma. Disc levels:  Intervertebral disc heights are maintained. Upper chest: Unremarkable Other: None. IMPRESSION: No acute cervical spine fracture. Electronically Signed   By: Macy Mis M.D.   On: 01/28/2021 11:48   DG CHEST PORT 1 VIEW  Result Date: 01/29/2021 CLINICAL DATA:  Chest tube placement EXAM: PORTABLE CHEST 1 VIEW COMPARISON:  01/29/2021 at 1129 hours FINDINGS: Interval placement of a left sided pigtail pleural drainage catheter. Endotracheal tube positioned within the midthoracic trachea. Left and right internal jugular approach central venous catheters are stable in positioning terminating at the level of the distal SVC. An enteric tube courses below the diaphragm, distal tip beyond the inferior margin of the film. Stable heart size. Interval reduction in size of left-sided pneumothorax. Trace left apical pneumothorax remains, less than 5% size. Slightly improved aeration of the lung fields. Bilateral hilar lymphadenopathy. IMPRESSION: 1. Interval placement of left-sided chest tube with reduction in size of left-sided pneumothorax. Trace left apical pneumothorax remains, less than 5% size. 2. Improving aeration of the lung fields. Otherwise stable radiographic appearance of the chest. 3. Lines and tubes as above. Electronically Signed   By: Davina Poke D.O.   On: 01/29/2021 14:45   DG CHEST PORT 1 VIEW  Result Date: 01/29/2021 CLINICAL DATA:  30 year old male status post new central line placement. Unresponsive patient. EXAM: PORTABLE CHEST 1 VIEW COMPARISON:  Chest x-ray 01/28/2021. FINDINGS: New left internal jugular central venous catheter with tip projecting over the distal superior vena cava. There is a left-sided subclavian central venous catheter with tip terminating in the proximal to mid superior vena cava. An endotracheal tube is in place with tip 6.9 cm above the carina. Right IJ Vas-Cath with tip terminating in the distal superior  vena cava an additional small bore catheter or fragment of a catheter is noted projecting over the upper mediastinum measuring approximately 2 cm in length. A nasogastric tube is seen extending into the stomach, however, the tip of the nasogastric tube extends below the lower margin of the  image. New large left-sided pneumothorax occupying greater than 50% of the volume of the left hemithorax. Patchy ill-defined opacities and areas of interstitial prominence throughout the right mid to lower lung. No right pneumothorax. No evidence of pulmonary edema. Heart size is borderline enlarged. Upper mediastinal contours are within normal limits. IMPRESSION: 1. New large left-sided pneumothorax following placement of left IJ central venous catheter. 2. Additional support apparatus, as above. Please take note of the apparent catheter fragment projecting over the upper mediastinum. Alternatively, this could be exterior to the patient. Clinical correlation is recommended. 3. Patchy ill-defined opacities and areas of interstitial prominence in the right mid to lower lung which could reflect developing aspiration pneumonitis. Attention on follow-up studies is recommended. Critical Value/emergent results were called by telephone at the time of interpretation on 01/29/2021 at 12:15 pm to provider nurse Arthur Holms for Dr. Jacky Kindle, who verbally acknowledged these results. Electronically Signed   By: Vinnie Langton M.D.   On: 01/29/2021 12:18   DG CHEST PORT 1 VIEW  Result Date: 01/28/2021 CLINICAL DATA:  Central line placement. EXAM: PORTABLE CHEST 1 VIEW COMPARISON:  January 28, 2021. FINDINGS: Stable cardiomediastinal silhouette. Endotracheal and nasogastric tubes are unchanged in position. Interval placement of right internal jugular catheter is noted with distal tip in expected position of SVC. Interval placement of left subclavian catheter with distal tip in expected position of SVC. No pneumothorax is noted. Left  lung is clear. Mild right basilar atelectasis or infiltrate is noted. Bony thorax is unremarkable. IMPRESSION: Interval placement of bilateral internal jugular catheters with tips in SVC. No pneumothorax is noted. Increased right basilar opacity is noted concerning for pneumonia or atelectasis. Electronically Signed   By: Marijo Conception M.D.   On: 01/28/2021 16:17   DG Chest Portable 1 View  Result Date: 01/28/2021 CLINICAL DATA:  Check endotracheal tube placement EXAM: PORTABLE CHEST 1 VIEW COMPARISON:  None. FINDINGS: Cardiac shadow is within normal limits. Endotracheal tube is noted in satisfactory position. Gastric catheter extends into the distal esophagus. This should be advanced several cm deeper into the stomach. Lungs are well aerated bilaterally. Fullness in the right paratracheal region and hila bilaterally is noted consistent with lymphadenopathy. No focal infiltrate or effusion is noted. IMPRESSION: Tubes and lines as described above. Gastric catheter should be advanced several cm deeper into the stomach. Changes consistent with lymphadenopathy in the right paratracheal region and bilateral hila. This may represent underlying sarcoidosis although CT of the chest with contrast is recommended for further evaluation. Electronically Signed   By: Inez Catalina M.D.   On: 01/28/2021 12:07   EEG adult  Result Date: 01/28/2021 Lora Havens, MD     01/28/2021  4:37 PM Patient Name: Jourdin Connors MRN: [id number] Epilepsy Attending: Lora Havens Referring Physician/Provider: Asa Saunas, NP Date: 01/28/2021 Duration: 22.21 mins Patient history: 29yo M with ams. EEG to evaluate for seizure Level of alertness: comatose AEDs during EEG study: LEV, versed Technical aspects: This EEG study was done with scalp electrodes positioned according to the 10-20 International system of electrode placement. Electrical activity was acquired at a sampling rate of '500Hz'  and reviewed with a high frequency filter of  '70Hz'  and a low frequency filter of '1Hz' . EEG data were recorded continuously and digitally stored. Description: EEG showed continuous generalized 3 to 6 Hz theta-delta slowing admixed an excessive amount of 15 to 18 Hz beta activity with irregular morphology distributed symmetrically and diffusely.  Hyperventilation and photic stimulation were not performed.  ABNORMALITY - Continuous slow, generalized - Excessive beta, generalized IMPRESSION: This study is suggestive of moderate to severe diffuse encephalopathy, nonspecific etiology but likely related to sedation. The excessive beta activity seen in the background is most likely due to the effect of benzodiazepine and is a benign EEG pattern. No seizures or epileptiform discharges were seen throughout the recording. Priyanka Barbra Sarks   Overnight EEG with video  Result Date: 01/29/2021 Lora Havens, MD     01/30/2021  8:32 AM Patient Name: Coran Dipaola MRN: [id number] Epilepsy Attending: Lora Havens Referring Physician/Provider: Dr Su Monks Duration: 01/28/2021 1421 to 01/29/2021 1421  Patient history: 30yo M with ams. EEG to evaluate for seizure  Level of alertness: lethargic/sedated  AEDs during EEG study: LEV  Technical aspects: This EEG study was done with scalp electrodes positioned according to the 10-20 International system of electrode placement. Electrical activity was acquired at a sampling rate of '500Hz'  and reviewed with a high frequency filter of '70Hz'  and a low frequency filter of '1Hz' . EEG data were recorded continuously and digitally stored.  Description: EEG showed continuous generalized polymorphic sharply contoured 3 to 6 Hz theta-delta slowing. Hyperventilation and photic stimulation were not performed.    ABNORMALITY - Continuous slow, generalized  IMPRESSION: This study is suggestive of moderate diffuse encephalopathy, nonspecific etiology. No seizures or epileptiform discharges were seen throughout the recording.   Lora Havens    ECHOCARDIOGRAM COMPLETE  Result Date: 01/29/2021    ECHOCARDIOGRAM REPORT   Patient Name:   CLIFFARD HAIR Date of Exam: 01/29/2021 Medical Rec #:  [id number]     Height:       68.0 in Accession #:    [id number]    Weight:       157.2 lb Date of Birth:  1990-08-13     BSA:          1.845 m Patient Age:    30 years      BP:           86/62 mmHg Patient Gender: M             HR:           73 bpm. Exam Location:  Inpatient Procedure: 2D Echo, Cardiac Doppler and Color Doppler Indications:    Elevated troponin  History:        Patient has prior history of Echocardiogram examinations and                 Patient has no prior history of Echocardiogram examinations.                 Signs/Symptoms:Resp. failure. Multi-organ failure, HIV,                 polysubstance abuse, narcotic overdose, Elevated troponin.  Sonographer:    Dustin Flock RDCS Referring Phys: [place]  1. Left ventricular ejection fraction, by estimation, is 40 to 45%. The left ventricle has mildly decreased function. The left ventricle demonstrates regional wall motion abnormalities (see scoring diagram/findings for description). Left ventricular diastolic parameters were normal. There is dyskinesis of the left ventricular, basal inferolateral wall and anterolateral wall.  2. Right ventricular systolic function is mildly reduced. The right ventricular size is normal. There is normal pulmonary artery systolic pressure.  3. The mitral valve is abnormal. Trivial mitral valve regurgitation. No evidence of mitral stenosis.  4. The aortic valve is tricuspid. Aortic valve regurgitation is not visualized. Mild aortic valve  sclerosis is present, with no evidence of aortic valve stenosis.  5. The inferior vena cava is normal in size with greater than 50% respiratory variability, suggesting right atrial pressure of 3 mmHg. Comparison(s): No prior Echocardiogram. Conclusion(s)/Recommendation(s): Global mild hypokinesis with focal  dyskinesis of the basal lateral wall. There is also thickening of the mitral valve concerning for endocarditis, cannot be fully evaluated on this study. Consider TEE. Findings and recommendations communicated to Dr. Lake Bells. FINDINGS  Left Ventricle: Left ventricular ejection fraction, by estimation, is 40 to 45%. The left ventricle has mildly decreased function. The left ventricle demonstrates regional wall motion abnormalities. The left ventricular internal cavity size was normal in size. There is no left ventricular hypertrophy. Left ventricular diastolic parameters were normal. Right Ventricle: The right ventricular size is normal. Right vetricular wall thickness was not well visualized. Right ventricular systolic function is mildly reduced. There is normal pulmonary artery systolic pressure. The tricuspid regurgitant velocity is 1.80 m/s, and with an assumed right atrial pressure of 3 mmHg, the estimated right ventricular systolic pressure is 89.3 mmHg. Left Atrium: Left atrial size was normal in size. Right Atrium: Right atrial size was normal in size. Pericardium: There is no evidence of pericardial effusion. Mitral Valve: There is a thickening on the posterior leaflet of the mitral valve that extends into the left atrium. Concerning for endocarditis. The mitral valve is abnormal. There is moderate thickening of the mitral valve leaflet(s). Trivial mitral valve regurgitation. No evidence of mitral valve stenosis. Tricuspid Valve: The tricuspid valve is normal in structure. Tricuspid valve regurgitation is mild . No evidence of tricuspid stenosis. Aortic Valve: The aortic valve is tricuspid. Aortic valve regurgitation is not visualized. Mild aortic valve sclerosis is present, with no evidence of aortic valve stenosis. Pulmonic Valve: The pulmonic valve was not well visualized. Pulmonic valve regurgitation is not visualized. No evidence of pulmonic stenosis. Aorta: The aortic root and ascending aorta are  structurally normal, with no evidence of dilitation and the aortic arch was not well visualized. Venous: The inferior vena cava is normal in size with greater than 50% respiratory variability, suggesting right atrial pressure of 3 mmHg. IAS/Shunts: The atrial septum is grossly normal.  LEFT VENTRICLE PLAX 2D LVIDd:         4.00 cm  Diastology LVIDs:         3.00 cm  LV e' medial:    5.44 cm/s LV PW:         1.10 cm  LV E/e' medial:  9.9 LV IVS:        1.00 cm  LV e' lateral:   4.68 cm/s LVOT diam:     2.10 cm  LV E/e' lateral: 11.5 LV SV:         32 LV SV Index:   17 LVOT Area:     3.46 cm  RIGHT VENTRICLE RV Basal diam:  3.00 cm RV S prime:     6.31 cm/s TAPSE (M-mode): 1.1 cm LEFT ATRIUM           Index       RIGHT ATRIUM          Index LA diam:      3.10 cm 1.68 cm/m  RA Area:     9.97 cm LA Vol (A2C): 28.5 ml 15.45 ml/m RA Volume:   20.50 ml 11.11 ml/m LA Vol (A4C): 22.7 ml 12.30 ml/m  AORTIC VALVE LVOT Vmax:   45.80 cm/s LVOT Vmean:  31.000 cm/s LVOT VTI:  0.092 m  AORTA Ao Root diam: 3.50 cm MITRAL VALVE               TRICUSPID VALVE MV Area (PHT): 4.06 cm    TR Peak grad:   13.0 mmHg MV Decel Time: 187 msec    TR Vmax:        180.00 cm/s MV E velocity: 53.60 cm/s MV A velocity: 31.20 cm/s  SHUNTS MV E/A ratio:  1.72        Systemic VTI:  0.09 m                            Systemic Diam: 2.10 cm Buford Dresser MD Electronically signed by Buford Dresser MD Signature Date/Time: 01/29/2021/1:31:35 PM    Final     chlorhexidine gluconate (MEDLINE KIT)  15 mL Mouth Rinse BID   Chlorhexidine Gluconate Cloth  6 each Topical Daily   Chlorhexidine Gluconate Cloth  6 each Topical Daily   docusate  100 mg Per Tube BID   feeding supplement (PROSource TF)  90 mL Per Tube TID   heparin injection (subcutaneous)  5,000 Units Subcutaneous Q8H   mouth rinse  15 mL Mouth Rinse 10 times per day   mirtazapine  45 mg Per Tube QHS   pantoprazole (PROTONIX) IV  40 mg Intravenous Q12H   polyethylene  glycol  17 g Per Tube Daily   pregabalin  75 mg Per Tube BID   sodium chloride flush  10-40 mL Intracatheter Q12H    BMET    Component Value Date/Time   NA 135 01/30/2021 0321   K 3.2 (L) 01/30/2021 0321   CL 102 01/30/2021 0321   CO2 24 01/30/2021 0321   GLUCOSE 156 (H) 01/30/2021 0321   BUN 20 01/30/2021 0321   CREATININE 1.09 01/30/2021 0321   CALCIUM 7.9 (L) 01/30/2021 0321   GFRNONAA >60 01/30/2021 0321   CBC    Component Value Date/Time   WBC 11.6 (H) 01/30/2021 0321   RBC 4.19 (L) 01/30/2021 0321   HGB 11.3 (L) 01/30/2021 0321   HCT 33.5 (L) 01/30/2021 0321   PLT 138 (L) 01/30/2021 0321   MCV 80.0 01/30/2021 0321   MCH 27.0 01/30/2021 0321   MCHC 33.7 01/30/2021 0321   RDW 14.5 01/30/2021 0321   LYMPHSABS 2.7 01/28/2021 1102   MONOABS 2.8 (H) 01/28/2021 1102   EOSABS 0.0 01/28/2021 1102   BASOSABS 0.1 01/28/2021 1102    Assessment/Plan:  AKI - presumably due to combination of rhabdomyolysis and ischemic ATN in setting of shock, however toxicology studies are pending.   CRRT started 01/28/21 after RIJ trialysis catheter placed by PCCM Changed fluids 01/29/21:  4K/2.5Ca dialysate at 1500 ml/hr, 4K/2.5Ca pre-filter replacement fluid at 500 ml/hr, 4K/2.5Ca post-filter at 300 ml/hr. Heparin for anticoagulation but changed to full heparin and not fixed dose. Keep even for now given hypotension and pressors. UOP is increasing and hopefully starting to see renal recovery.  May be able to stop CRRT soon Acute hypoxic respiratory failure with hypercapnia - s/p intubation, management per PCCM Shock - unclear if sepsis vs cardiogenic vs combination - s/p LP to r/o meningitis and started on ampicillin, cefepime, and vancomycin.  Currently on levophed and dobutamine per PCCM.  Will need ECHO.  CT angio negative for PE. Pulmonary lymphadenopathy - workup per PCCM, possible bronch. Hyperkalemia - due to #1. Improved with CVVHD . Hypophosphatemia - KPhos given IV last night Acute  metabolic  encephalopathy - s/p LP, Neuro consulted.  EEG with nonspecific moderate to severe diffuse encephalopathy.  No seizure activity seen.  For MRI of brain when more stable. HIV - was on BIKTARVY but compliance was sporadic. Non anion gap metabolic acidosis - toxicology studies pending and will use bicarbonate with CVVHD.  Donetta Potts, MD [employer] [phone]

## 2021-01-30 NOTE — Progress Notes (Signed)
Discontinued cEEG study.  Notified [facility] monitoring.  No skin breakdown observed. 

## 2021-01-30 NOTE — Progress Notes (Signed)
[facility] ADULT ICU REPLACEMENT PROTOCOL   The patient does apply for the [facility] Adult ICU Electrolyte Replacment Protocol based on the criteria listed below:   1.Exclusion criteria: TCTS patients, ECMO patients and Hypothermia Protocol, and   Dialysis patients 2. Is GFR >/= 30 ml/min? Yes.    Patient's GFR today is >60 3. Is SCr </= 2? Yes Patient's SCr is 1.09 mg/dL 4. Did SCr increase >/= 0.5 in 24 hours? No. 5.Pt's weight >40kg  Yes.   6. Abnormal electrolyte(s): K+ 3.2, phos 1.7  7. Electrolytes replaced per protocol 8.  Call MD STAT for K+ </= 2.5, Phos </= 1, or Mag </= 1 Physician:  n/a  Melvern Banker 01/30/2021 4:47 AM

## 2021-01-30 NOTE — Progress Notes (Signed)
Pt transported on vent from 2M03  to CT,without any complications, RN at bedside RT will continue to monitor.

## 2021-01-30 NOTE — Progress Notes (Signed)
eLink Physician-Brief Progress Note Patient Name: Howard Rowland DOB: [birth year] MRN: [id number]   Date of Service  01/30/2021  HPI/Events of Note  MAP 60 mmHg, patient is on CRRT.  eICU Interventions  Levophed  (0-10 mcg) gtt ordered to keep MAP > 65 mmHg.        Thomasene Lot [name] 01/30/2021, 9:24 PM

## 2021-01-30 NOTE — Progress Notes (Addendum)
Neurology Progress Note  Brief HPI: 30 y.o. male with PMHx of polysubstance abuse, PTSD, HIV, and MDD who presented to the ED via EMS after being found unresponsive by a bystander s/p Narcan administration with subsequent altered mental status with obtundation. On arrival to the ED, he was found to be hypoxic on a nonrebreather with labored breathing and coarse breath sounds with a witnessed episode of seizure activity s/p lorazepam administration and subsequent intubation for airway protection. MRI brain wwo showed extensive symmetric T2/FLAIR hyperintensity in the bilateral supratentorial white matter associated with diffusion restriction but no contrast enhancement most c/w acute toxic leukoencephalopathy.  S: Extubated today. Sedated and in 4 pt restraints 2/2 agitation.   Exam: Vitals:   01/31/21 1730 01/31/21 1800  BP: 108/66 118/69  Pulse: (!) 107 (!) 105  Resp: (!) 9 10  Temp: (!) 100.4 F (38 C) (!) 100.4 F (38 C)  SpO2: 98% 99%   Pt seen at bedside but full neurologic exam not completed 2/2 extreme agitation just before I arrived  Pertinent Labs: CBC    Component Value Date/Time   WBC 8.8 01/31/2021 0515   RBC 3.74 (L) 01/31/2021 0515   HGB 10.1 (L) 01/31/2021 0515   HCT 30.4 (L) 01/31/2021 0515   PLT 120 (L) 01/31/2021 0515   MCV 81.3 01/31/2021 0515   MCH 27.0 01/31/2021 0515   MCHC 33.2 01/31/2021 0515   RDW 14.6 01/31/2021 0515   LYMPHSABS 1.9 01/31/2021 0515   MONOABS 0.6 01/31/2021 0515   EOSABS 0.1 01/31/2021 0515   BASOSABS 0.0 01/31/2021 0515   CMP     Component Value Date/Time   NA 139 01/31/2021 0515   K 3.0 (L) 01/31/2021 0515   CL 108 01/31/2021 0515   CO2 23 01/31/2021 0515   GLUCOSE 116 (H) 01/31/2021 0515   BUN 15 01/31/2021 0515   CREATININE 0.89 01/31/2021 0515   CALCIUM 8.0 (L) 01/31/2021 0515   PROT 5.5 (L) 01/31/2021 0515   ALBUMIN 2.9 (L) 01/31/2021 0515   AST 560 (H) 01/31/2021 0515   ALT 748 (H) 01/31/2021 0515   ALKPHOS 71  01/31/2021 0515   BILITOT 1.1 01/31/2021 0515   GFRNONAA >60 01/31/2021 0515     Ref. Range 01/28/2021 13:04 01/28/2021 13:04  Appearance, CSF Latest Ref Range: CLEAR  CLEAR CLEAR  RBC Count, CSF Latest Ref Range: 0 /cu mm 1 (H) 99 (H)  WBC, CSF Latest Ref Range: 0 - 5 /cu mm 1 1  Other Cells, CSF Unknown TOO FEW TO COUNT, SMEAR AVAILABLE FOR REVIEW TOO FEW TO COUNT, SMEAR AVAILABLE FOR REVIEW  Color, CSF Latest Ref Range: COLORLESS  COLORLESS COLORLESS  Supernatant Unknown NOT INDICATED NOT INDICATED  Tube # Unknown 4 1   Drugs of Abuse     Component Value Date/Time   LABOPIA POSITIVE (A) 01/28/2021 1215   COCAINSCRNUR NONE DETECTED 01/28/2021 1215   LABBENZ NONE DETECTED 01/28/2021 1215   AMPHETMU NONE DETECTED 01/28/2021 1215   THCU NONE DETECTED 01/28/2021 1215   LABBARB NONE DETECTED 01/28/2021 1215    Urinalysis    Component Value Date/Time   COLORURINE AMBER (A) 01/28/2021 1215   APPEARANCEUR CLOUDY (A) 01/28/2021 1215   LABSPEC 1.017 01/28/2021 1215   PHURINE 5.0 01/28/2021 1215   GLUCOSEU NEGATIVE 01/28/2021 1215   HGBUR LARGE (A) 01/28/2021 1215   BILIRUBINUR NEGATIVE 01/28/2021 1215   KETONESUR NEGATIVE 01/28/2021 1215   PROTEINUR 30 (A) 01/28/2021 1215   NITRITE NEGATIVE 01/28/2021 1215   LEUKOCYTESUR  NEGATIVE 01/28/2021 1215     Ref. Range 01/28/2021 12:15  Bacteria, UA Latest Ref Range: NONE SEEN  FEW (A)  Hyaline Casts, UA Unknown PRESENT  RBC / HPF Latest Ref Range: 0 - 5 RBC/hpf 6-10  Squamous Epithelial / LPF Latest Ref Range: 0 - 5  0-5  WBC, UA Latest Ref Range: 0 - 5 WBC/hpf 0-5    Unresulted Labs (From admission, onward)     Start     Ordered   02/01/21 0500  CBC  Tomorrow morning,   R       Question:  Specimen collection method  Answer:  Unit=Unit collect   01/31/21 0804   01/30/21 0500  Fungitell, Serum  Tomorrow morning,   R       Question:  Specimen collection method  Answer:  Unit=Unit collect   01/29/21 1706   01/30/21 0500  Toxoplasma  Gondii, PCR  Tomorrow morning,   R        01/29/21 1809   01/29/21 1707  Histoplasma antigen, urine  Once,   R        01/29/21 1706   01/29/21 1707  Blastomyces Antigen  Once,   R        01/29/21 1706   01/29/21 1600  Renal function panel (daily at 1600)  Daily at 1600,   R (with TIMED occurrences)      01/28/21 1653   01/29/21 0500  Blood gas, arterial  Tomorrow morning,   R        01/28/21 1258   01/29/21 0500  Renal function panel (daily at 0500)  Daily,   R      01/28/21 1653   01/29/21 0500  Magnesium  Daily,   R      01/28/21 1653   01/28/21 1320  Miscellaneous [facility] test (send-out)  Once,   STAT       Question:  Test name / description:  Answer:  Bupropion and Hydroxybupropion, Serum or Plasma. [facility] [id number]   01/28/21 1320   01/28/21 1318  Miscellaneous [facility] test (send-out)  Once,   STAT       Question:  Test name / description:  Answer:  ToxAssure Flex 23, Urine. [facility] [id number]   01/28/21 1320   01/28/21 1304  Miscellaneous [facility] test (send-out)  Once,   STAT       Question:  Test name / description:  Answer:  Human Herpesvirus 6 (HHV-6), DNA PCR, [facility] test ID [id number]   01/28/21 1304   01/28/21 1300  JC virus, PRC CSF  Once,   STAT       Question:  Release to patient  Answer:  Immediate   01/28/21 1304   01/28/21 1258  HSV 1/2 Ab IgG/IgM CSF  Once,   STAT       Question:  Release to patient  Answer:  Immediate   01/28/21 1304   Unscheduled  Occult blood card to lab, stool RN will collect  As needed,   R     Comments: Please heme check OG drainage   Question:  Specimen to be collected by:  Answer:  RN will collect   01/28/21 1528   Pending  Lactic acid, plasma  ONCE - STAT,   R       Question:  Specimen collection method  Answer:  Unit=Unit collect   Pending           CMV dna by PCR: Negative CD4: 707 VZV CSF negative  Imaging Reviewed:  CT[facility] wo 01/28/2021: No acute intracranial abnormality. Age-indeterminate left nasal bone fracture.   EEG  overnight 01/28/2021 - 01/29/2021: "This study is suggestive of moderate diffuse encephalopathy, nonspecific etiology. No seizures or epileptiform discharges were seen throughout the recording."  Overnight EEG 01/29/2021 - 01/30/2021: This study is suggestive of moderate diffuse encephalopathy, nonspecific etiology. No seizures or epileptiform discharges were seen throughout the recording.  MRI brain wwo showed extensive symmetric T2/FLAIR hyperintensity in the bilateral supratentorial white matter associated with diffusion restriction btu no contrast enhancement (personal review of actual images and radiology report).    Assessment: 30 yo patient with hx HIV, polysubstance abuse, PTSD, MDD found unresponsive then developed witnessed seizure activity in ED and was intubated for airway protection. He received 40mg /kg Keppra load and sedation with versed. Initial EEG recording showed no electrographic seizures; continuous EEG 01/28/2021 - 01/30/2021 revealed moderate diffuse encephalopathy without seizures or epileptiform discharges. CSF not c/f infection. ID is considering further workup for LAD ddx incl sarcoidosis.  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 I discussed the imaging findings with Dr. 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   Recommendations: - Continue Keppra 1,000 mg BID for now - Continue acyclovir pending HSV PCR CSF results - Antibiotics per ID recommendations - Per lab, no further CSF for send out for autoimmune encephalitis panel - Serum autoimmune encephalitis panel- [facility] send out - Extended drugs of abuse panel sent to [facility]; pending  - Follow up outstanding CSF studies; initial CSF studies unremarkable - Continue inpatient seizure precautions - LTM EEG d/c'd, follow clinically  Will f/u tmrw  Elinor Parkinson, MD [facility] [phone]  If 7pm- 7am, please page neurology on call as listed in AMION.

## 2021-01-30 NOTE — Progress Notes (Addendum)
I have seen and examined the patient. I have personally reviewed the clinical findings, laboratory findings, microbiological data and imaging studies. The assessment and treatment plan was discussed with the  Advance Practice Provider, Mauricio Po  I agree with her/his recommendations except following additions/corrections.  Remains afebrile, WBC is downtrending, Off pressors, remains intubated with possible plan for extubation  Resp culture consistent with Normal resp flora Planned for MRI brain and CT chest - pending  Unable to get consent for TEE and BAL/Biopsy currently  EEG: moderate diffuse encephalopathy, No seizures or epileptiform discharges   Exam- orotracheally intubated and sedated             PERRLA            Coarse breath sounds b/l            S1s2, RRR            Abdomen - soft            Extremities - no pedal edema            Rt neck HD catheter/Left IJ CVC and Rt groin art line  Plan  Will continue Vancomycin ( pending TEE given concerns of endocarditis in TTE)  and Unasyn ( aspiration Pneumonia) for now Continue Acyclovir pending HSV PCR CSF  Plan to start Descovy and Tivicay pending MRI brain. Per ID pharmacist, patient has recently filled [place] [facility] in mid August implying compliance to ART TEE/BAL/Biopsy when feasible per PCCM. Primary trying to reach father.  Follow up pending labs and cultures Monitor CBC, BMP and Vancomycin trough F/U Neuro recs Following   Rosiland Oz, MD Infectious Disease Physician [facility] for Infectious Disease [address] [place], [place] [address] Phone: [phone]  Fax: [place] for Infectious Disease  Date of Admission:  01/28/2021     Total days of antibiotics 3         ASSESSMENT:  Mr. Howard Rowland blood cultures have remained without growth and new cultures from 8/31 are without growth in <24 hours. Respiratory culture with gram stain showing gram  positive cocci and culture too young to read. Respiratory virus panel negative. Clinically appears to be improving and now off vasopressors. Awaiting CT chest and MRI head. Will restart ART therapy once completed. Critical Care team working on BAL and biopsy of hilar/mediastinal lymph nodes. Moderate diffuse encephalopathy with no seizures of EEG - Neurology following. No electrical evidence of seizure. Will need TEE when able. Continue current dose of vancomycin and Unasyn. CRRT per nephrology and supportive care per primary team.   PLAN:  Continue vancomycin and Unasyn. Monitor cultures for presence of bacteremia. Await results of MRI and CT imaging. Hold ART therapy pending MRI imaging. TEE when able Follow up serologies for EBV, CMV, Toxo, HIV, and Histo/Blasto Continue CRRT per nephrology and supportive care per primary team.   Active Problems:   Respiratory failure ([place])   Shock ([place])   Status epilepticus ([place])   Pneumothorax, left   HIV disease ([place])   Altered mental status    chlorhexidine gluconate (MEDLINE KIT)  15 mL Mouth Rinse BID   Chlorhexidine Gluconate Cloth  6 each Topical Daily   Chlorhexidine Gluconate Cloth  6 each Topical Daily   docusate  100 mg Per Tube BID   feeding supplement (PROSource TF)  90 mL Per Tube TID   heparin injection (subcutaneous)  5,000 Units Subcutaneous Q8H  mouth rinse  15 mL Mouth Rinse 10 times per day   mirtazapine  45 mg Per Tube QHS   pantoprazole (PROTONIX) IV  40 mg Intravenous Q12H   polyethylene glycol  17 g Per Tube Daily   pregabalin  75 mg Per Tube BID   sodium chloride flush  10-40 mL Intracatheter Q12H    SUBJECTIVE:  Afebrile overnight with elevated temperature up to 99.7 F. WBC count trending down to 11.6. Awaiting CT chest and MRI brian. Off vasopressors with agitation overnight. Remains sedated and restrained at present.   Allergies  Allergen Reactions   Gabapentin Other (See Comments)    Stomach cramps   Haldol  [Haloperidol] Other (See Comments)    Dystonia   Naloxone Swelling and Other (See Comments)    Tongue swelling   Prednisone Swelling and Other (See Comments)    Pharyngeal swelling   Suboxone [Buprenorphine Hcl-Naloxone Hcl] Swelling and Other (See Comments)    Tongue swelling   Lurasidone Anxiety   Ziprasidone Hives     Review of Systems: Review of Systems  Unable to perform ROS: Intubated     OBJECTIVE: Vitals:   01/30/21 0745 01/30/21 0800 01/30/21 0815 01/30/21 0830  BP:  108/66    Pulse: 62 (!) 59 (!) 138 69  Resp: (!) 24 (!) 24 20 (!) 24  Temp: 98.2 F (36.8 C) 97.9 F (36.6 C) 97.9 F (36.6 C) 98.2 F (36.8 C)  TempSrc:  Bladder    SpO2: 100% 100% 95% 100%  Weight:      Height:       Body mass index is 24.03 kg/m.  Physical Exam Constitutional:      General: He is not in acute distress.    Appearance: He is well-developed. He is ill-appearing.     Interventions: He is sedated, intubated and restrained.     Comments: Lying in bed with head of bed elevated; sedated.   Cardiovascular:     Rate and Rhythm: Normal rate and regular rhythm.     Heart sounds:    Gallop present.     Comments: Right IJ dialysis cathter present and clean/dry; left IJ central venous line infusing and clean/dry; Arterial line right groin clean/dry.  Pulmonary:     Effort: Pulmonary effort is normal. He is intubated.     Breath sounds: Normal breath sounds.  Skin:    General: Skin is warm and dry.    Lab Results Lab Results  Component Value Date   WBC 11.6 (H) 01/30/2021   HGB 11.3 (L) 01/30/2021   HCT 33.5 (L) 01/30/2021   MCV 80.0 01/30/2021   PLT 138 (L) 01/30/2021    Lab Results  Component Value Date   CREATININE 1.09 01/30/2021   BUN 20 01/30/2021   NA 135 01/30/2021   K 3.2 (L) 01/30/2021   CL 102 01/30/2021   CO2 24 01/30/2021    Lab Results  Component Value Date   ALT 106 (H) 01/28/2021   AST 129 (H) 01/28/2021   ALKPHOS 56 01/28/2021   BILITOT 0.9  01/28/2021     Microbiology: Recent Results (from the past 240 hour(s))  Resp Panel by RT-PCR (Flu A&B, Covid) Nasopharyngeal Swab     Status: None   Collection Time: 01/20/21 11:42 PM   Specimen: Nasopharyngeal Swab; Nasopharyngeal(NP) swabs in vial transport medium  Result Value Ref Range Status   SARS Coronavirus 2 by RT PCR NEGATIVE NEGATIVE Final    Comment: (NOTE) SARS-CoV-2 target nucleic acids  are NOT DETECTED.  The SARS-CoV-2 RNA is generally detectable in upper respiratory specimens during the acute phase of infection. The lowest concentration of SARS-CoV-2 viral copies this assay can detect is 138 copies/mL. A negative result does not preclude SARS-Cov-2 infection and should not be used as the sole basis for treatment or other patient management decisions. A negative result may occur with  improper specimen collection/handling, submission of specimen other than nasopharyngeal swab, presence of viral mutation(s) within the areas targeted by this assay, and inadequate number of viral copies(<138 copies/mL). A negative result must be combined with clinical observations, patient history, and epidemiological information. The expected result is Negative.  Fact Sheet for Patients:  EntrepreneurPulse.com.au  Fact Sheet for Healthcare Providers:  IncredibleEmployment.be  This test is no t yet approved or cleared by the [place] FDA and  has been authorized for detection and/or diagnosis of SARS-CoV-2 by FDA under an Emergency Use Authorization (EUA). This EUA will remain  in effect (meaning this test can be used) for the duration of the COVID-19 declaration under Section 564(b)(1) of the Act, 21 U.S.C.section 360bbb-3(b)(1), unless the authorization is terminated  or revoked sooner.       Influenza A by PCR NEGATIVE NEGATIVE Final   Influenza B by PCR NEGATIVE NEGATIVE Final    Comment: (NOTE) The Xpert Xpress SARS-CoV-2/FLU/RSV  plus assay is intended as an aid in the diagnosis of influenza from Nasopharyngeal swab specimens and should not be used as a sole basis for treatment. Nasal washings and aspirates are unacceptable for Xpert Xpress SARS-CoV-2/FLU/RSV testing.  Fact Sheet for Patients: EntrepreneurPulse.com.au  Fact Sheet for Healthcare Providers: IncredibleEmployment.be  This test is not yet approved or cleared by the [place] FDA and has been authorized for detection and/or diagnosis of SARS-CoV-2 by FDA under an Emergency Use Authorization (EUA). This EUA will remain in effect (meaning this test can be used) for the duration of the COVID-19 declaration under Section 564(b)(1) of the Act, 21 U.S.C. section 360bbb-3(b)(1), unless the authorization is terminated or revoked.  Performed at [facility], [place] [address] [place],  [address]   Resp Panel by RT-PCR (Flu A&B, Covid) Nasopharyngeal Swab     Status: None   Collection Time: 01/28/21 11:54 AM   Specimen: Nasopharyngeal Swab; Nasopharyngeal(NP) swabs in vial transport medium  Result Value Ref Range Status   SARS Coronavirus 2 by RT PCR NEGATIVE NEGATIVE Final    Comment: (NOTE) SARS-CoV-2 target nucleic acids are NOT DETECTED.  The SARS-CoV-2 RNA is generally detectable in upper respiratory specimens during the acute phase of infection. The lowest concentration of SARS-CoV-2 viral copies this assay can detect is 138 copies/mL. A negative result does not preclude SARS-Cov-2 infection and should not be used as the sole basis for treatment or other patient management decisions. A negative result may occur with  improper specimen collection/handling, submission of specimen other than nasopharyngeal swab, presence of viral mutation(s) within the areas targeted by this assay, and inadequate number of viral copies(<138 copies/mL). A negative result must be combined with clinical observations,  patient history, and epidemiological information. The expected result is Negative.  Fact Sheet for Patients:  EntrepreneurPulse.com.au  Fact Sheet for Healthcare Providers:  IncredibleEmployment.be  This test is no t yet approved or cleared by the [place] FDA and  has been authorized for detection and/or diagnosis of SARS-CoV-2 by FDA under an Emergency Use Authorization (EUA). This EUA will remain  in effect (meaning this test can be used)  for the duration of the COVID-19 declaration under Section 564(b)(1) of the Act, 21 U.S.C.section 360bbb-3(b)(1), unless the authorization is terminated  or revoked sooner.       Influenza A by PCR NEGATIVE NEGATIVE Final   Influenza B by PCR NEGATIVE NEGATIVE Final    Comment: (NOTE) The Xpert Xpress SARS-CoV-2/FLU/RSV plus assay is intended as an aid in the diagnosis of influenza from Nasopharyngeal swab specimens and should not be used as a sole basis for treatment. Nasal washings and aspirates are unacceptable for Xpert Xpress SARS-CoV-2/FLU/RSV testing.  Fact Sheet for Patients: EntrepreneurPulse.com.au  Fact Sheet for Healthcare Providers: IncredibleEmployment.be  This test is not yet approved or cleared by the [place] FDA and has been authorized for detection and/or diagnosis of SARS-CoV-2 by FDA under an Emergency Use Authorization (EUA). This EUA will remain in effect (meaning this test can be used) for the duration of the COVID-19 declaration under Section 564(b)(1) of the Act, 21 U.S.C. section 360bbb-3(b)(1), unless the authorization is terminated or revoked.  Performed at [facility], [place] [address], [place], [place] [address]   Culture, blood (routine x 2)     Status: None (Preliminary result)   Collection Time: 01/28/21 12:51 PM   Specimen: BLOOD  Result Value Ref Range Status   Specimen Description BLOOD LEFT ANTECUBITAL  Final    Special Requests   Final    BOTTLES DRAWN AEROBIC ONLY Blood Culture adequate volume   Culture   Final    NO GROWTH 2 DAYS Performed at [facility], [place] [address], [place], [place] [address]    Report Status PENDING  Incomplete  CSF culture w Gram Stain     Status: None (Preliminary result)   Collection Time: 01/28/21  5:24 PM   Specimen: CSF  Result Value Ref Range Status   Specimen Description CSF  Final   Special Requests CSF  Final   Gram Stain NO ORGANISMS SEEN CYTOSPIN SMEAR   Final   Culture   Final    NO GROWTH < 24 HOURS Performed at [facility], [place] [address], [place], [place] [address]    Report Status PENDING  Incomplete  Culture, blood (routine x 2)     Status: None (Preliminary result)   Collection Time: 01/28/21  5:25 PM   Specimen: BLOOD  Result Value Ref Range Status   Specimen Description BLOOD LEFT ANTECUBITAL  Final   Special Requests   Final    BOTTLES DRAWN AEROBIC AND ANAEROBIC Blood Culture results may not be optimal due to an inadequate volume of blood received in culture bottles   Culture   Final    NO GROWTH 2 DAYS Performed at [facility], [place] [address], [place], [place] [address]    Report Status PENDING  Incomplete  Culture, Respiratory w Gram Stain (tracheal aspirate)     Status: None (Preliminary result)   Collection Time: 01/28/21 11:17 PM   Specimen: PATH Cytology Urine; Respiratory  Result Value Ref Range Status   Specimen Description TRACHEAL ASPIRATE  Final   Special Requests Immunocompromised  Final   Gram Stain   Final    FEW SQUAMOUS EPITHELIAL CELLS PRESENT ABUNDANT WBC PRESENT, PREDOMINANTLY PMN FEW GRAM POSITIVE COCCI    Culture   Final    TOO YOUNG TO READ Performed at [facility], [place] [address], [place], [place] [address]    Report Status PENDING  Incomplete  Culture, blood (routine x 2)  Status: None (Preliminary result)   Collection Time: 01/29/21  4:01 PM   Specimen: BLOOD  Result  Value Ref Range Status   Specimen Description BLOOD LEFT ANTECUBITAL  Final   Special Requests   Final    BOTTLES DRAWN AEROBIC AND ANAEROBIC Blood Culture adequate volume   Culture   Final    NO GROWTH < 24 HOURS Performed at [facility], [address] [place], [place] [address]    Report Status PENDING  Incomplete  Culture, blood (routine x 2)     Status: None (Preliminary result)   Collection Time: 01/29/21  4:07 PM   Specimen: BLOOD LEFT HAND  Result Value Ref Range Status   Specimen Description BLOOD LEFT HAND  Final   Special Requests   Final    BOTTLES DRAWN AEROBIC ONLY Blood Culture adequate volume   Culture   Final    NO GROWTH < 24 HOURS Performed at [facility], [place] [address], [place], [place] [address]    Report Status PENDING  Incomplete  Respiratory (~20 pathogens) panel by PCR     Status: None   Collection Time: 01/29/21  4:17 PM   Specimen: Nasopharyngeal Swab; Respiratory  Result Value Ref Range Status   Adenovirus NOT DETECTED NOT DETECTED Final   Coronavirus 229E NOT DETECTED NOT DETECTED Final    Comment: (NOTE) The Coronavirus on the Respiratory Panel, DOES NOT test for the novel  Coronavirus (2019 nCoV)    Coronavirus HKU1 NOT DETECTED NOT DETECTED Final   Coronavirus NL63 NOT DETECTED NOT DETECTED Final   Coronavirus OC43 NOT DETECTED NOT DETECTED Final   Metapneumovirus NOT DETECTED NOT DETECTED Final   Rhinovirus / Enterovirus NOT DETECTED NOT DETECTED Final   Influenza A NOT DETECTED NOT DETECTED Final   Influenza B NOT DETECTED NOT DETECTED Final   Parainfluenza Virus 1 NOT DETECTED NOT DETECTED Final   Parainfluenza Virus 2 NOT DETECTED NOT DETECTED Final   Parainfluenza Virus 3 NOT DETECTED NOT DETECTED Final   Parainfluenza Virus 4 NOT DETECTED NOT DETECTED Final   Respiratory Syncytial Virus NOT DETECTED NOT DETECTED Final   Bordetella pertussis NOT DETECTED NOT DETECTED Final   Bordetella Parapertussis NOT DETECTED NOT  DETECTED Final   Chlamydophila pneumoniae NOT DETECTED NOT DETECTED Final   Mycoplasma pneumoniae NOT DETECTED NOT DETECTED Final    Comment: Performed at [facility], [place], [place], [place] [address]     Terri Piedra, [place] for Infectious Disease [facility]  01/30/2021  9:11 AM

## 2021-01-30 NOTE — Progress Notes (Signed)
Pt transported on vent from CT to MRI and back to 2M03,without any complications, RN at bedside RT will continue to monitor.

## 2021-01-30 NOTE — Progress Notes (Addendum)
NAME:  Howard Rowland, MRN:  [id number], DOB:  09-May-1991, LOS: 2 ADMISSION DATE:  01/28/2021, CONSULTATION DATE:  8/30 REFERRING MD:  Karene Fry, CHIEF COMPLAINT:  Unresponsive resulting in intubation   History of Present Illness:  30 y/o male with HIV admitted after being found down in a hotel room with presumed narcotic overdose.  Many recent visits to ER with behavioral health involvement regarding depression.   Pertinent  Medical History  MDD PTSD Cocaine abuse  Significant Hospital Events: Including procedures, antibiotic start and stop dates in addition to other pertinent events   8/30 admitted after being found unresponsive 8/31 left pneumothorax  Procedures: 8/30 ETT >  8/30 R IJ HD cath >  8/30 L [place] CVL > 8/31 8/30 R femoral arterial line >  8/31 L IJ CVL >  8/31 L chest tube >   Imaging: 8/30 CT angiogram chest > no PE, bilateral lower lobe infiltrates, considerable bilateral hilar and mediastinal adenopathy 8/31 TTE >LVEF 40-45%, dyskinesis of the left ventricular, basal inferolateral wall, mitral valve regurgitation and thickening, consider TEE, aortic valve with mild sclerosis  Interim History / Subjective:  Severe agitation overnight, started on fentanyl infusion Off vasopressors  Objective   Blood pressure 108/66, pulse (!) 59, temperature 97.9 F (36.6 C), temperature source Bladder, resp. rate (!) 24, height 5\' 8"  (1.727 m), weight 71.7 kg, SpO2 100 %.    Vent Mode: PRVC FiO2 (%):  [40 %-50 %] 40 % Set Rate:  [24 bmp] 24 bmp Vt Set:  [540 mL] 540 mL PEEP:  [5 cmH20] 5 cmH20 Plateau Pressure:  [18 cmH20-23 cmH20] 18 cmH20   Intake/Output Summary (Last 24 hours) at 01/30/2021 0806 Last data filed at 01/30/2021 0800 Gross per 24 hour  Intake 3406.62 ml  Output 4232 ml  Net -825.38 ml   Filed Weights   01/29/21 0500 01/30/21 0500  Weight: 71.3 kg 71.7 kg    Examination:  General:  In bed on vent HENT: NCAT ETT in place PULM: CTA B, vent supported  breathing, left chest tube in place CV: RRR, no mgr GI: BS+, soft, nontender MSK: normal bulk and tone Neuro: sedated on vent  8/31 CXR reviewed> left pneumothorax decreased in size after chest tube, left pigtail in place, ett in place, R IJ CVL in place, ?foreign body or catheter tip noted 1cm in size  Resolved Hospital Problem list     Assessment & Plan:  Acute respiratory failure with hypoxemia> improved Hold extubation attempt or pressure support ventilation attempt until after CT chest and MRI brain Full mechanical vent support VAP prevention Daily WUA/SBT > as above  AKI> making urine Continuing CRRT for now, considering improvement in shock suspect he will regain renal function soon Monitor BMET and UOP Replace electrolytes as needed  Hypokalemia Hypophosphatemia Kphos this morning Repeat labs in AM  Acute metabolic encephalopathy after being found down from presumed narcotic overdose Periods of severe agitation pulling at tubes, lines PAD protocol > order today RASS goal -3 Precedex, fentanyl infusion  Versed prn Frequent orientation  Aspiration pneumonia Unasyn 5 days  Mediastinal adenopathy: in immunocompromised host, many different possibilities.  He really needs a bronchoscopy soon with EBUS, biopsy of lymph nodes and BAL for culture.  However we aren't sure who to get consent from for this as there is question about whether or not the person who says she is his wife is legally married to him. Will discuss with his father if we can reach him.  HIV  Restarting HAART per ID  Septic shock: resolved Monitor hemodynamics  Seizure post cardiac arrest Keppra LTM EEG MRI brain  Appreciate Neurology input  Left pneumothorax Monitor chest tube: no air leak today   Mitral valve thickening: would be reasonable to get TEE, however need to know who we should consent for this.  In the absence of bacteremia this is not urgent.  Question of foreign body (catheter  tip?) seen in mediastinum of CXR CT chest  Best Practice (right click and "Reselect all SmartList Selections" daily)   Diet/type: tubefeeds DVT prophylaxis: prophylactic heparin  GI prophylaxis: PPI Lines: Central line, Dialysis Catheter, and yes and it is still needed d/c arterial line today Foley:  Yes, and it is still needed Code Status:  full code Last date of multidisciplinary goals of care discussion [n/a]  Labs   CBC: Recent Labs  Lab 01/28/21 1102 01/28/21 1125 01/28/21 1320 01/28/21 1324 01/29/21 0444 01/29/21 0458 01/30/21 0321  WBC 21.7*  --   --  21.0*  --  16.2* 11.6*  NEUTROABS 15.6*  --   --   --   --   --   --   HGB 13.1   < > 13.9 13.6 12.9* 13.0 11.3*  HCT 42.6   < > 41.0 44.8 38.0* 39.3 33.5*  MCV 87.7  --   --  88.5  --  82.2 80.0  PLT 416*  --   --  250  --  194 138*   < > = values in this interval not displayed.    Basic Metabolic Panel: Recent Labs  Lab 01/28/21 1324 01/28/21 2244 01/29/21 0444 01/29/21 0458 01/29/21 1631 01/30/21 0321  NA 139 141 143 141 138 135  K >7.5* 5.3* 4.0 4.4 3.4* 3.2*  CL 112* 113*  --  101 101 102  CO2 20* 17*  --  21* 26 24  GLUCOSE 139* 155*  --  199* 124* 156*  BUN 14 19  --  18 17 20   CREATININE 3.08* 2.73*  --  2.26* 1.42* 1.09  CALCIUM 6.4* 6.5*  --  7.3* 7.8* 7.9*  MG 2.0  --   --  1.8  --  2.4  PHOS 5.1*  --   --  3.8 3.2 1.7*   GFR: Estimated Creatinine Clearance: 97.6 mL/min (by C-G formula based on SCr of 1.09 mg/dL). Recent Labs  Lab 01/28/21 1102 01/28/21 1324 01/28/21 1333 01/28/21 2049 01/29/21 0458 01/29/21 1002 01/30/21 0321  PROCALCITON  --  3.02  --   --   --   --   --   WBC 21.7* 21.0*  --   --  16.2*  --  11.6*  LATICACIDVEN  --   --  4.9* 4.1*  --  4.8*  --     Liver Function Tests: Recent Labs  Lab 01/28/21 1102 01/28/21 1324 01/29/21 0458 01/29/21 1631 01/30/21 0321  AST 103* 129*  --   --   --   ALT 117* 106*  --   --   --   ALKPHOS 71 56  --   --   --   BILITOT  0.7 0.9  --   --   --   PROT 6.3* 4.8*  --   --   --   ALBUMIN 3.7 2.9* 3.0* 2.9* 3.0*   Recent Labs  Lab 01/28/21 1324  LIPASE 22  AMYLASE 537*   Recent Labs  Lab 01/28/21 1116  AMMONIA 75*    ABG  Component Value Date/Time   PHART 7.444 01/29/2021 0444   PCO2ART 26.3 (L) 01/29/2021 0444   PO2ART 121 (H) 01/29/2021 0444   HCO3 18.1 (L) 01/29/2021 0444   TCO2 19 (L) 01/29/2021 0444   ACIDBASEDEF 5.0 (H) 01/29/2021 0444   O2SAT 98.4 01/29/2021 1002     Coagulation Profile: Recent Labs  Lab 01/28/21 1324  INR 1.2    Cardiac Enzymes: Recent Labs  Lab 01/28/21 1324  CKTOTAL 16,288*    HbA1C: Hgb A1c MFr Bld  Date/Time Value Ref Range Status  01/18/2021 08:14 AM 5.7 (H) 4.8 - 5.6 % Final    Comment:    (NOTE) Pre diabetes:          5.7%-6.4%  Diabetes:              >6.4%  Glycemic control for   <7.0% adults with diabetes     CBG: Recent Labs  Lab 01/29/21 1523 01/29/21 2009 01/29/21 2310 01/30/21 0309 01/30/21 0728  GLUCAP 141* 147* 126* 144* 147*    Critical care time: 35 minutes    Heber Dorado, MD [place] [facility] Pager: [phone] Cell: [phone] After 7:00 pm call [place]  [phone]

## 2021-01-30 NOTE — Progress Notes (Signed)
[employer]  called by NCM, Authorization.# [id number] received.  Gae Gallop RN,BSN,CM

## 2021-01-31 ENCOUNTER — Inpatient Hospital Stay (HOSPITAL_COMMUNITY): Payer: No Typology Code available for payment source

## 2021-01-31 DIAGNOSIS — J9601 Acute respiratory failure with hypoxia: Secondary | ICD-10-CM | POA: Diagnosis not present

## 2021-01-31 DIAGNOSIS — J939 Pneumothorax, unspecified: Secondary | ICD-10-CM | POA: Diagnosis not present

## 2021-01-31 LAB — CULTURE, RESPIRATORY W GRAM STAIN: Culture: NORMAL

## 2021-01-31 LAB — CBC WITH DIFFERENTIAL/PLATELET
Abs Immature Granulocytes: 0.06 10*3/uL (ref 0.00–0.07)
Basophils Absolute: 0 10*3/uL (ref 0.0–0.1)
Basophils Relative: 0 %
Eosinophils Absolute: 0.1 10*3/uL (ref 0.0–0.5)
Eosinophils Relative: 1 %
HCT: 30.4 % — ABNORMAL LOW (ref 39.0–52.0)
Hemoglobin: 10.1 g/dL — ABNORMAL LOW (ref 13.0–17.0)
Immature Granulocytes: 1 %
Lymphocytes Relative: 22 %
Lymphs Abs: 1.9 10*3/uL (ref 0.7–4.0)
MCH: 27 pg (ref 26.0–34.0)
MCHC: 33.2 g/dL (ref 30.0–36.0)
MCV: 81.3 fL (ref 80.0–100.0)
Monocytes Absolute: 0.6 10*3/uL (ref 0.1–1.0)
Monocytes Relative: 7 %
Neutro Abs: 6.1 10*3/uL (ref 1.7–7.7)
Neutrophils Relative %: 69 %
Platelets: 120 10*3/uL — ABNORMAL LOW (ref 150–400)
RBC: 3.74 MIL/uL — ABNORMAL LOW (ref 4.22–5.81)
RDW: 14.6 % (ref 11.5–15.5)
WBC: 8.8 10*3/uL (ref 4.0–10.5)
nRBC: 0 % (ref 0.0–0.2)

## 2021-01-31 LAB — COMPREHENSIVE METABOLIC PANEL
ALT: 748 U/L — ABNORMAL HIGH (ref 0–44)
AST: 560 U/L — ABNORMAL HIGH (ref 15–41)
Albumin: 2.9 g/dL — ABNORMAL LOW (ref 3.5–5.0)
Alkaline Phosphatase: 71 U/L (ref 38–126)
Anion gap: 8 (ref 5–15)
BUN: 15 mg/dL (ref 6–20)
CO2: 23 mmol/L (ref 22–32)
Calcium: 8 mg/dL — ABNORMAL LOW (ref 8.9–10.3)
Chloride: 108 mmol/L (ref 98–111)
Creatinine, Ser: 0.89 mg/dL (ref 0.61–1.24)
GFR, Estimated: >60 mL/min (ref >60)
Glucose, Bld: 116 mg/dL — ABNORMAL HIGH (ref 70–99)
Potassium: 3 mmol/L — ABNORMAL LOW (ref 3.5–5.1)
Sodium: 139 mmol/L (ref 135–145)
Total Bilirubin: 1.1 mg/dL (ref 0.3–1.2)
Total Protein: 5.5 g/dL — ABNORMAL LOW (ref 6.5–8.1)

## 2021-01-31 LAB — GLUCOSE, CAPILLARY
Glucose-Capillary: 97 mg/dL (ref 70–99)
Glucose-Capillary: 118 mg/dL — ABNORMAL HIGH (ref 70–99)
Glucose-Capillary: 125 mg/dL — ABNORMAL HIGH (ref 70–99)
Glucose-Capillary: 94 mg/dL (ref 70–99)
Glucose-Capillary: 74 mg/dL (ref 70–99)
Glucose-Capillary: 68 mg/dL — ABNORMAL LOW (ref 70–99)
Glucose-Capillary: 93 mg/dL (ref 70–99)

## 2021-01-31 LAB — POCT ACTIVATED CLOTTING TIME
Activated Clotting Time: 155 seconds
Activated Clotting Time: 161 seconds
Activated Clotting Time: 167 seconds
Activated Clotting Time: 173 seconds
Activated Clotting Time: 173 seconds
Activated Clotting Time: 179 seconds
Activated Clotting Time: 179 seconds
Activated Clotting Time: 179 seconds
Activated Clotting Time: 196 seconds
Activated Clotting Time: 208 seconds

## 2021-01-31 LAB — ENTEROVIRUS PCR: Enterovirus PCR: NEGATIVE

## 2021-01-31 LAB — PHOSPHORUS: Phosphorus: 1.2 mg/dL — ABNORMAL LOW (ref 2.5–4.6)

## 2021-01-31 LAB — MAGNESIUM: Magnesium: 2.2 mg/dL (ref 1.7–2.4)

## 2021-01-31 LAB — VANCOMYCIN, RANDOM: Vancomycin Rm: <4

## 2021-01-31 LAB — APTT: aPTT: 63 seconds — ABNORMAL HIGH (ref 24–36)

## 2021-01-31 LAB — EPSTEIN BARR VRS(EBV DNA BY PCR): EBV DNA QN by PCR: NEGATIVE IU/mL

## 2021-01-31 MED ORDER — DEXTROSE 50 % IV SOLN: 12.5000 g | INTRAVENOUS | Status: AC

## 2021-01-31 MED ORDER — HALOPERIDOL LACTATE 5 MG/ML IJ SOLN
INTRAMUSCULAR | Status: AC
  Filled 2021-01-31: qty 1

## 2021-01-31 MED ORDER — DOLUTEGRAVIR SODIUM 50 MG PO TABS
50.0000 mg | ORAL_TABLET | Freq: Every day | ORAL | Status: DC
  Administered 2021-02-02 – 2021-02-03 (×2): 50 mg via ORAL
  Filled 2021-01-31 (×5): qty 1

## 2021-01-31 MED ORDER — MIRTAZAPINE 15 MG PO TABS
45.0000 mg | ORAL_TABLET | Freq: Every day | ORAL | Status: DC
  Administered 2021-02-03 – 2021-02-07 (×5): 45 mg via ORAL
  Filled 2021-01-31 (×7): qty 3

## 2021-01-31 MED ORDER — EMTRICITABINE-TENOFOVIR AF 200-25 MG PO TABS
1.0000 | ORAL_TABLET | Freq: Every day | ORAL | Status: DC
  Administered 2021-02-02 – 2021-02-03 (×2): 1 via ORAL
  Filled 2021-01-31 (×5): qty 1

## 2021-01-31 MED ORDER — ACETAMINOPHEN 325 MG PO TABS
650.0000 mg | ORAL_TABLET | Freq: Four times a day (QID) | ORAL | Status: DC | PRN
  Administered 2021-02-05: 650 mg via ORAL
  Filled 2021-01-31: qty 2

## 2021-01-31 MED ORDER — HEPARIN BOLUS VIA INFUSION (CRRT)
1000.0000 [IU] | INTRAVENOUS | Status: DC | PRN
  Filled 2021-01-31: qty 1000

## 2021-01-31 MED ORDER — SODIUM CHLORIDE 0.9 % IV SOLN: 3.0000 g | Freq: Three times a day (TID) | INTRAVENOUS | Status: DC

## 2021-01-31 MED ORDER — MIDAZOLAM HCL 2 MG/2ML IJ SOLN
2.0000 mg | Freq: Once | INTRAMUSCULAR | Status: AC
  Administered 2021-01-31: 2 mg via INTRAVENOUS

## 2021-01-31 MED ORDER — PANTOPRAZOLE SODIUM 40 MG IV SOLR
40.0000 mg | Freq: Every day | INTRAVENOUS | Status: DC
  Administered 2021-01-31: 40 mg via INTRAVENOUS
  Filled 2021-01-31: qty 40

## 2021-01-31 MED ORDER — VANCOMYCIN HCL 500 MG/100ML IV SOLN
500.0000 mg | Freq: Two times a day (BID) | INTRAVENOUS | Status: DC
  Administered 2021-01-31 – 2021-02-03 (×6): 500 mg via INTRAVENOUS
  Filled 2021-01-31 (×8): qty 100

## 2021-01-31 MED ORDER — ORAL CARE MOUTH RINSE
15.0000 mL | Freq: Two times a day (BID) | OROMUCOSAL | Status: DC
Start: 2021-01-31 — End: 2021-02-04
  Administered 2021-01-31 – 2021-02-03 (×5): 15 mL via OROMUCOSAL

## 2021-01-31 MED ORDER — HALOPERIDOL LACTATE 5 MG/ML IJ SOLN
5.0000 mg | Freq: Once | INTRAMUSCULAR | Status: AC
  Administered 2021-01-31: 5 mg via INTRAVENOUS

## 2021-01-31 MED ORDER — POTASSIUM CHLORIDE CRYS ER 20 MEQ PO TBCR
40.0000 meq | EXTENDED_RELEASE_TABLET | ORAL | Status: DC
Start: 2021-01-31 — End: 2021-01-31

## 2021-01-31 MED ORDER — DEXTROSE 50 % IV SOLN
INTRAVENOUS | Status: AC
  Administered 2021-01-31: 12.5 g via INTRAVENOUS
  Filled 2021-01-31: qty 50

## 2021-01-31 MED ORDER — POTASSIUM CHLORIDE 20 MEQ PO PACK
40.0000 meq | PACK | ORAL | Status: DC
Start: 2021-01-31 — End: 2021-01-31
  Administered 2021-01-31: 40 meq
  Filled 2021-01-31: qty 2

## 2021-01-31 MED ORDER — LORAZEPAM 2 MG/ML IJ SOLN
1.0000 mg | INTRAMUSCULAR | Status: DC | PRN
  Administered 2021-01-31 – 2021-02-01 (×6): 2 mg via INTRAVENOUS
  Administered 2021-02-01 – 2021-02-02 (×4): 4 mg via INTRAVENOUS
  Administered 2021-02-03 – 2021-02-04 (×3): 2 mg via INTRAVENOUS
  Administered 2021-02-04 – 2021-02-05 (×2): 4 mg via INTRAVENOUS
  Administered 2021-02-05 – 2021-02-06 (×2): 2 mg via INTRAVENOUS
  Filled 2021-01-31: qty 1
  Filled 2021-01-31: qty 2
  Filled 2021-01-31 (×5): qty 1
  Filled 2021-01-31 (×2): qty 2
  Filled 2021-01-31 (×4): qty 1
  Filled 2021-01-31: qty 2
  Filled 2021-01-31 (×2): qty 1
  Filled 2021-01-31: qty 2
  Filled 2021-01-31 (×2): qty 1

## 2021-01-31 MED ORDER — SODIUM CHLORIDE 0.9 % IV SOLN
3.0000 g | Freq: Four times a day (QID) | INTRAVENOUS | Status: DC
  Administered 2021-01-31 – 2021-02-04 (×15): 3 g via INTRAVENOUS
  Filled 2021-01-31 (×17): qty 8

## 2021-01-31 MED ORDER — PREGABALIN 75 MG PO CAPS
75.0000 mg | ORAL_CAPSULE | Freq: Two times a day (BID) | ORAL | Status: DC
  Administered 2021-02-02 – 2021-02-08 (×12): 75 mg via ORAL
  Filled 2021-01-31 (×13): qty 1

## 2021-01-31 MED ORDER — SODIUM CHLORIDE 0.9 % IV SOLN
250.0000 [IU]/h | INTRAVENOUS | Status: DC
  Administered 2021-01-31: 1600 [IU]/h via INTRAVENOUS_CENTRAL
  Filled 2021-01-31: qty 2

## 2021-01-31 MED ORDER — POTASSIUM PHOSPHATES 15 MMOLE/5ML IV SOLN
30.0000 mmol | Freq: Once | INTRAVENOUS | Status: AC
  Administered 2021-01-31: 30 mmol via INTRAVENOUS
  Filled 2021-01-31: qty 10

## 2021-01-31 MED ORDER — DEXMEDETOMIDINE HCL IN NACL 400 MCG/100ML IV SOLN
0.0000 ug/kg/h | INTRAVENOUS | Status: DC
Start: 2021-01-31 — End: 2021-02-02
  Administered 2021-02-01: 0.5 ug/kg/h via INTRAVENOUS
  Filled 2021-01-31: qty 100

## 2021-01-31 NOTE — Progress Notes (Signed)
eLink Physician-Brief Progress Note Patient Name: Chauncey Sciulli DOB: 1990-10-27 MRN: [id number]   Date of Service  01/31/2021  HPI/Events of Note  Persistent hypokalemia despite aggressive replacement.  eICU Interventions  KCL 40 meq via NG tube Q 4 hours x 3 ordered.        Thomasene Lot Essie Lagunes 01/31/2021, 6:15 AM

## 2021-01-31 NOTE — Progress Notes (Addendum)
Nutrition Follow-up  DOCUMENTATION CODES:   Not applicable  INTERVENTION:   If unable to advance diet within the next 24-48 hours, recommend placing NG tube for enteral nutrition support. Jevity 1.5 at 65 ml/h with Prosource TF 45 ml once daily would provide 2380 kcal, 111 gm protein, 1186 ml free water daily. Next Cortrak service will be Tuesday, 9/6.   NUTRITION DIAGNOSIS:   Increased nutrient needs related to acute illness (Renal failure requiring CRRT) as evidenced by estimated needs.  Ongoing   GOAL:   Patient will meet greater than or equal to 90% of their needs  Unmet  MONITOR:   Vent status, TF tolerance, Labs  REASON FOR ASSESSMENT:   Ventilator    ASSESSMENT:   30 yo male admitted after being found unresponsive in a hotel room, worrisome for narcotic overdose. Required intubation in th ER and noted to have multi-organ failure. PMH includes MDD, polysubstance abuse, PTSD.  Discussed patient in ICU rounds and with RN today.  CRRT stopped and patient extubated this morning.  OG tube removed. TF off.  Not alert enough for POs today. Holding off on Cortrak today, hopeful that mentation will improve enough to start PO diet over the weekend.   S/P chest tube placement 8/31 for L pneumothorax.  15 ml output x 24 hours.  Labs reviewed. K 3, phos 1.2, LFTs elevated CBG: 118-125  Medications reviewed and include Remeron, Protonix, Precedex, Keppra, K Phos.   Admission weight 71.3 kg Current weight 68.9 kg  I/O -461 ml since admission  NUTRITION - FOCUSED PHYSICAL EXAM:  Unable to complete  Diet Order:   Diet Order             Diet NPO time specified  Diet effective now                   EDUCATION NEEDS:   Not appropriate for education at this time  Skin:  Skin Assessment: Reviewed RN Assessment  Last BM:  no BM documented  Height:   Ht Readings from Last 1 Encounters:  01/28/21 5\' 8"  (1.727 m)    Weight:   Wt Readings from Last 1  Encounters:  01/31/21 68.9 kg    Ideal Body Weight:  70 kg  BMI:  Body mass index is 23.1 kg/m.  Estimated Nutritional Needs:   Kcal:  2200-2400  Protein:  110-130 gm  Fluid:  >/= 2.2 L   04/02/21, RD, LDN, CNSC Please refer to Amion for contact information.

## 2021-01-31 NOTE — Procedures (Signed)
Extubation Procedure Note  Patient Details:   Name: Howard Rowland DOB: July 11, 1990 MRN: [id number]   Airway Documentation:    Vent end date: 01/31/21 Vent end time: 0840   Evaluation  O2 sats: transiently fell during during procedure Complications: No apparent complications Patient did tolerate procedure well. Bilateral Breath Sounds: Clear, Diminished   Yes   Patient was extubated to 3 LPM. SPO2 dropped low mid 80's and oxygen was increased to 5 LPM. Patient SPO2 increased to 97%. Patient had a positive cuff leak. Once sedation wore off more patient had a strong productive cough. No stridor was noted. BBS were clear; diminished. Patient was able to speak name to RN. Will wean O2. SPO2 currently 99%.   [name] M 01/31/2021, 8:49 AM

## 2021-01-31 NOTE — Progress Notes (Addendum)
I have seen and examined the patient. I have personally reviewed the clinical findings, laboratory findings, microbiological data and imaging studies. The assessment and treatment plan was discussed with the  Advance Practice Provider, Howard Rowland  I agree with her/his recommendations except following additions/corrections.  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 Defer to primary/cardiology on timing of TEE  Complete 7 days course of Unasyn for concerns for aspiration PNA Continue Acyclovir pending HSV PCR  Repeat imaging of chest for need of BAL/Biopsy per PCCM Can switch Descovy and Tivicay to [facility] when able to take Rowland.  Monitor CBC, CMP, and Vancomycin trough  Follow up cultures/pending ID labs  Follow up Neurology recs  Following peripherally over the long weekend, please call with questions/concerns.   Howard Oz, MD Infectious Disease Physician [facility] [address] [place], [place] [address] Phone: [phone]  Fax: [place] [facility] Infectious Disease  Date of Admission:  01/28/2021     Total days of antibiotics 4         ASSESSMENT:  Howard Rowland was extubated this morning and CRRT has been stopped. Currently not following commands/answering questions likely related to recent sedation. Multiple sets of blood cultures have remained without growth to  date and respiratory culture with normal respiratory flora. Mediastinal adenopathy with plans to re-image in several weeks as there is potential this may be reactive. Continue vancomycin and Unasyn. Continue acyclovir while awaiting HSV CSF results. If negative can discontinue acyclovir. Recommend TEE to evaluate mitral valve thickening in the setting of drug use when able. HIV well controlled with undetectable viral load. Will continue Tivicay and Descovy. When taking orals can switch to [employer]. Continue supportive care per primary team.   PLAN:  Continue vancomycin and Unasyn.  Monitor blood cultures for bacteremia. Continue acyclovir until HSV resulted. If negative will discontinue. Re-evaluate adenopathy in several weeks per Pulmonology Recommend TEE when able given drug use Continue Tivicay and Descovy until able to take orals then can change back to [place].  Remaining supportive care per primary team.   Active Problems:   Respiratory failure ([place])   Shock ([place])   Status epilepticus ([place])   Pneumothorax, left   HIV disease ([place])   Altered mental status    chlorhexidine gluconate (MEDLINE KIT)  15 mL Mouth Rinse BID   Chlorhexidine Gluconate Cloth  6 each Topical Daily   dolutegravir  50 mg Oral Daily   emtricitabine-tenofovir AF  1 tablet Oral Daily   heparin injection (subcutaneous)  5,000 Units Subcutaneous Q8H   mouth rinse  15 mL Mouth Rinse 10 times per day   mirtazapine  45 mg Oral QHS   pantoprazole (PROTONIX) IV  40 mg Intravenous Q12H   potassium chloride  40 mEq Per Tube Q4H   pregabalin  75 mg Oral BID   sodium chloride flush  10-40 mL Intracatheter Q12H    SUBJECTIVE:  Elevated temperature of  101.3 following extubation. Normalized WBC count. CRRT stopped and extubated. Not following commands or answering questions. Asking for water and moving around the bed.   Allergies  Allergen Reactions   Gabapentin Other (See Comments)    Stomach cramps   Haldol  [Haloperidol] Other (See Comments)    Dystonia   Naloxone Swelling and Other (See Comments)    Tongue swelling   Prednisone Swelling and Other (See Comments)    Pharyngeal swelling   Suboxone [Buprenorphine Hcl-Naloxone Hcl] Swelling and Other (See Comments)    Tongue swelling   Lurasidone Anxiety   Ziprasidone Hives     Review of Systems: Review of Systems  Unable to perform ROS: Mental status change     OBJECTIVE: Vitals:   01/31/21 0630 01/31/21 0700 01/31/21 0717 01/31/21 0846  BP:  101/62    Pulse: (!) 59  61   Resp: (!) 24 (!) 24    Temp: (!) 97.2 F (36.2 C) (!) 97.2 F (36.2 C)    TempSrc:      SpO2: 100%  100% 96%  Weight:      Height:       Body mass index is 23.1 kg/m.  Physical Exam Constitutional:      General: He is not in acute distress.    Appearance: He is well-developed.     Comments: Lying in bed with head of bed elevated; restless  Cardiovascular:     Rate and Rhythm: Regular rhythm. Tachycardia present.     Heart sounds: Normal heart sounds.     Comments: Dialysis catheter in right internal jugular clean and dry; central line in left internal jugular clean and dry.  Pulmonary:     Effort: Pulmonary effort is normal. Tachypnea present.     Breath sounds: Normal breath sounds.  Skin:    General: Skin is warm and dry.  Neurological:     Mental Status: He is alert.    Lab Results Lab Results  Component Value Date   WBC 8.8 01/31/2021   HGB 10.1 (L) 01/31/2021   HCT 30.4 (L) 01/31/2021   MCV 81.3 01/31/2021   PLT 120 (L) 01/31/2021    Lab Results  Component Value Date   CREATININE 0.89 01/31/2021   BUN 15 01/31/2021   NA 139 01/31/2021   K 3.0 (L) 01/31/2021   CL 108 01/31/2021   CO2 23 01/31/2021    Lab Results  Component Value Date   ALT 748 (H) 01/31/2021   AST 560 (H) 01/31/2021   ALKPHOS 71 01/31/2021   BILITOT 1.1 01/31/2021     Microbiology: Recent Results (from the past 240 hour(s))  Resp Panel by RT-PCR (Flu  A&B, Covid) Nasopharyngeal Swab     Status: None   Collection Time: 01/28/21 11:54 AM   Specimen: Nasopharyngeal Swab; Nasopharyngeal(NP) swabs in vial transport medium  Result Value Ref Range Status   SARS Coronavirus 2 by RT PCR NEGATIVE NEGATIVE Final    Comment: (NOTE) SARS-CoV-2 target nucleic acids are NOT DETECTED.  The SARS-CoV-2 RNA is generally detectable in upper respiratory specimens during the acute phase of infection. The lowest concentration of SARS-CoV-2 viral copies this assay can detect is 138 copies/mL. A negative result does not preclude SARS-Cov-2 infection and should not be used as the sole basis for treatment or other patient management decisions. A negative result may occur with  improper specimen collection/handling, submission of specimen other than nasopharyngeal swab, presence of viral mutation(s) within the areas targeted by this assay, and  inadequate number of viral copies(<138 copies/mL). A negative result must be combined with clinical observations, patient history, and epidemiological information. The expected result is Negative.  Fact Sheet for Patients:  EntrepreneurPulse.com.au  Fact Sheet for Healthcare Providers:  IncredibleEmployment.be  This test is no t yet approved or cleared by the [place] FDA and  has been authorized for detection and/or diagnosis of SARS-CoV-2 by FDA under an Emergency Use Authorization (EUA). This EUA will remain  in effect (meaning this test can be used) for the duration of the COVID-19 declaration under Section 564(b)(1) of the Act, 21 U.S.C.section 360bbb-3(b)(1), unless the authorization is terminated  or revoked sooner.       Influenza A by PCR NEGATIVE NEGATIVE Final   Influenza B by PCR NEGATIVE NEGATIVE Final    Comment: (NOTE) The Xpert Xpress SARS-CoV-2/FLU/RSV plus assay is intended as an aid in the diagnosis of influenza from Nasopharyngeal swab specimens  and should not be used as a sole basis for treatment. Nasal washings and aspirates are unacceptable for Xpert Xpress SARS-CoV-2/FLU/RSV testing.  Fact Sheet for Patients: EntrepreneurPulse.com.au  Fact Sheet for Healthcare Providers: IncredibleEmployment.be  This test is not yet approved or cleared by the [place] FDA and has been authorized for detection and/or diagnosis of SARS-CoV-2 by FDA under an Emergency Use Authorization (EUA). This EUA will remain in effect (meaning this test can be used) for the duration of the COVID-19 declaration under Section 564(b)(1) of the Act, 21 U.S.C. section 360bbb-3(b)(1), unless the authorization is terminated or revoked.  Performed at [facility], [place] [address] [place], [place] [address]   Culture, blood (routine x 2)     Status: None (Preliminary result)   Collection Time: 01/28/21 12:51 PM   Specimen: BLOOD  Result Value Ref Range Status   Specimen Description BLOOD LEFT ANTECUBITAL  Final   Special Requests   Final    BOTTLES DRAWN AEROBIC ONLY Blood Culture adequate volume   Culture   Final    NO GROWTH 3 DAYS Performed at [facility], [place] [address], [place], [place] [address]    Report Status PENDING  Incomplete  Urine Culture     Status: None   Collection Time: 01/28/21 12:51 PM   Specimen: Urine, Catheterized  Result Value Ref Range Status   Specimen Description URINE, CATHETERIZED  Final   Special Requests NONE  Final   Culture   Final    NO GROWTH Performed at [facility], [address] [place], [place] [address]    Report Status 01/30/2021 FINAL  Final  CSF culture w Gram Stain     Status: None (Preliminary result)   Collection Time: 01/28/21  5:24 PM   Specimen: CSF  Result Value Ref Range Status   Specimen Description CSF  Final   Special Requests CSF  Final   Gram Stain NO ORGANISMS SEEN CYTOSPIN SMEAR   Final   Culture   Final    NO GROWTH 3  DAYS Performed at [facility], [place] [address] [place], [place] [address]    Report Status PENDING  Incomplete  Culture, blood (routine x 2)     Status: None (Preliminary result)   Collection Time: 01/28/21  5:25 PM   Specimen: BLOOD  Result Value Ref Range Status   Specimen Description BLOOD LEFT ANTECUBITAL  Final   Special Requests   Final    BOTTLES DRAWN AEROBIC AND ANAEROBIC Blood Culture results may not be  optimal due to an inadequate volume of blood received in culture bottles   Culture   Final    NO GROWTH 3 DAYS Performed at [facility], [place] [address] [place], [place] [address]    Report Status PENDING  Incomplete  Culture, Respiratory w Gram Stain (tracheal aspirate)     Status: None (Preliminary result)   Collection Time: 01/28/21 11:17 PM   Specimen: Tracheal Aspirate; Respiratory  Result Value Ref Range Status   Specimen Description TRACHEAL ASPIRATE  Final   Special Requests Immunocompromised  Final   Gram Stain   Final    FEW SQUAMOUS EPITHELIAL CELLS PRESENT ABUNDANT WBC PRESENT, PREDOMINANTLY PMN FEW GRAM POSITIVE COCCI    Culture   Final    RARE Consistent with normal respiratory flora. No Pseudomonas species isolated Performed at [place] [address] [place], [place] [address]    Report Status PENDING  Incomplete  Culture, blood (routine x 2)     Status: None (Preliminary result)   Collection Time: 01/29/21  4:01 PM   Specimen: BLOOD  Result Value Ref Range Status   Specimen Description BLOOD LEFT ANTECUBITAL  Final   Special Requests   Final    BOTTLES DRAWN AEROBIC AND ANAEROBIC Blood Culture adequate volume   Culture   Final    NO GROWTH 2 DAYS Performed at [facility], [place] [address], [place], [place] [address]    Report Status PENDING  Incomplete  Culture, blood (routine x 2)     Status: None (Preliminary result)   Collection Time: 01/29/21  4:07 PM   Specimen: BLOOD LEFT HAND  Result Value Ref Range Status    Specimen Description BLOOD LEFT HAND  Final   Special Requests   Final    BOTTLES DRAWN AEROBIC ONLY Blood Culture adequate volume   Culture   Final    NO GROWTH 2 DAYS Performed at [facility], [place] [address] [place], [place] [address]    Report Status PENDING  Incomplete  Respiratory (~20 pathogens) panel by PCR     Status: None   Collection Time: 01/29/21  4:17 PM   Specimen: Nasopharyngeal Swab; Respiratory  Result Value Ref Range Status   Adenovirus NOT DETECTED NOT DETECTED Final   Coronavirus 229E NOT DETECTED NOT DETECTED Final    Comment: (NOTE) The Coronavirus on the Respiratory Panel, DOES NOT test for the novel  Coronavirus (2019 nCoV)    Coronavirus HKU1 NOT DETECTED NOT DETECTED Final   Coronavirus NL63 NOT DETECTED NOT DETECTED Final   Coronavirus OC43 NOT DETECTED NOT DETECTED Final   Metapneumovirus NOT DETECTED NOT DETECTED Final   Rhinovirus / Enterovirus NOT DETECTED NOT DETECTED Final   Influenza A NOT DETECTED NOT DETECTED Final   Influenza B NOT DETECTED NOT DETECTED Final   Parainfluenza Virus 1 NOT DETECTED NOT DETECTED Final   Parainfluenza Virus 2 NOT DETECTED NOT DETECTED Final   Parainfluenza Virus 3 NOT DETECTED NOT DETECTED Final   Parainfluenza Virus 4 NOT DETECTED NOT DETECTED Final   Respiratory Syncytial Virus NOT DETECTED NOT DETECTED Final   Bordetella pertussis NOT DETECTED NOT DETECTED Final   Bordetella Parapertussis NOT DETECTED NOT DETECTED Final   Chlamydophila pneumoniae NOT DETECTED NOT DETECTED Final   Mycoplasma pneumoniae NOT DETECTED NOT DETECTED Final    Comment: Performed at [facility], [place]. [address] [place], [place] [address]  MRSA Next Gen by PCR, Nasal  Status: Abnormal   Collection Time: 01/30/21  8:22 AM   Specimen: Nasal Mucosa; Nasal Swab  Result Value Ref Range Status   MRSA by PCR Next Gen DETECTED (A) NOT DETECTED Final    Comment: RESULT CALLED TO, READ BACK BY AND VERIFIED WITH: RN  Howard Rowland AT [phone] ON 01/30/2021 BY Howard Rowland. (NOTE) The GeneXpert MRSA Assay (FDA approved for NASAL specimens only), is one component of a comprehensive MRSA colonization surveillance program. It is not intended to diagnose MRSA infection nor to guide or monitor treatment for MRSA infections. Test performance is not FDA approved in patients less than 47 years old. Performed at [facility], [place] [address] [place], [place] [address]      Howard Rowland, [place] [facility]  01/31/2021  8:58 AM

## 2021-01-31 NOTE — Progress Notes (Signed)
eLink Physician-Brief Progress Note Patient Name: Raheen Capili DOB: 1990/11/08 MRN: [id number]   Date of Service  01/31/2021  HPI/Events of Note  Patient's chest tube was dislodged while he was being bathed.  eICU Interventions  Stat portable CXR  ordered.        Thomasene Lot Siddarth Hsiung 01/31/2021, 4:45 AM

## 2021-01-31 NOTE — Progress Notes (Addendum)
eLink Physician-Brief Progress Note Patient Name: Howard Rowland DOB: [birth year] MRN: [id number]   Date of Service  01/31/2021  HPI/Events of Note  Patient with sub-optimal sedation, he is maxed on Precedex and Fentanyl gtt and received 2 mg of Versed recently but still appears agitated.  eICU Interventions  Will an additional 2 mg dose of Versed now.        Thomasene Lot Howard Rowland 01/31/2021, 1:07 AM

## 2021-01-31 NOTE — Progress Notes (Signed)
Pharmacy Antibiotic Note  Howard Rowland is a 30 y.o. male admitted on 01/28/2021 with  r/o herpes encephalitis/menigitis .  Pharmacy has been consulted for acyclovir, amp/sul and vancomycin dosing. WBC 8.9. SCr 0.89.  CRRT Stopped on 9/2 @ 0825  9/2 1200 VR returned <4  Plan: -Continue acyclovir 10 mg/kg (671 mg) Q 24 hours. -Start Vancomcyin 500 mg IV q12h  -Adjust Amp/sul 3gm q6h -Monitor CBC, renal fx, cultures and clinical progress -Will monitor vanco levels as needed based on renal function -Will leave acyclovir at current dose, assess renal function tomorrow since CRRT has been stopped now. Can consider increasing dose if renal function stable off CRRT.  Height: 5\' 8"  (172.7 cm) Weight: 68.9 kg (151 lb 14.4 oz) IBW/kg (Calculated) : 68.4  Temp (24hrs), Avg:98.3 F (36.8 C), Min:96.1 F (35.6 C), Max:101.3 F (38.5 C)  Recent Labs  Lab 01/28/21 1102 01/28/21 1125 01/28/21 1324 01/28/21 1333 01/28/21 2049 01/28/21 2244 01/29/21 0458 01/29/21 1002 01/29/21 1631 01/30/21 0321 01/30/21 1556 01/30/21 2133 01/31/21 0515 01/31/21 1206  WBC 21.7*  --  21.0*  --   --   --  16.2*  --   --  11.6*  --   --  8.8  --   CREATININE 3.09*   < > 3.08*  --   --    < > 2.26*  --  1.42* 1.09 0.91  --  0.89  --   LATICACIDVEN  --   --   --  4.9* 4.1*  --   --  4.8*  --   --   --  1.1  --   --   VANCORANDOM  --   --   --   --   --   --   --   --   --   --   --   --   --  <4   < > = values in this interval not displayed.     Estimated Creatinine Clearance: 119.6 mL/min (by C-G formula based on SCr of 0.89 mg/dL).    Allergies  Allergen Reactions   Gabapentin Other (See Comments)    Stomach cramps   Haldol [Haloperidol] Other (See Comments)    Dystonia   Naloxone Swelling and Other (See Comments)    Tongue swelling   Prednisone Swelling and Other (See Comments)    Pharyngeal swelling   Suboxone [Buprenorphine Hcl-Naloxone Hcl] Swelling and Other (See Comments)    Tongue  swelling   Lurasidone Anxiety   Ziprasidone Hives    Antimicrobials this admission: Cefepime 8/30 >> 831 Vancomycin 8/30 >>  Acyclovir 8/30 >>  Amp/Sul 8/31>>  Dose adjustments this admission: Change vancomycin to 500mg  IV q12h Adjust amp/sulb to q6h  Microbiology results: 8/30 BCx: NG 8/30 UCx:  NG 8/30 CMV PCR >> 8/30 EBV >>  8/30 CSF >>  8/30 HSV>>  8/30 Strep pneumo>> neg 8/30 Crypto>> neg  9/30, PharmD PGY2 Infectious Diseases Pharmacy Resident   Please check [email or web address] for unit-specific pharmacy phone numbers

## 2021-01-31 NOTE — Progress Notes (Signed)
NAME:  Howard Rowland, MRN:  [id number], DOB:  1990-07-31, LOS: 3 ADMISSION DATE:  01/28/2021, CONSULTATION DATE:  8/30 REFERRING MD:  Karene Fry, CHIEF COMPLAINT:  Unresponsive resulting in intubation   History of Present Illness:  30 y/o male with HIV admitted after being found down in a hotel room with presumed narcotic overdose.  Many recent visits to ER with behavioral health involvement regarding depression.   Pertinent  Medical History  MDD, PTSD, Cocaine abuse, HIV  Significant Hospital Events: Including procedures, antibiotic start and stop dates in addition to other pertinent events   8/30 admitted after being found unresponsive 8/31 left pneumothorax 9/02 extubated  Procedures: 8/30 ETT > 9/02 8/30 R IJ HD cath >  8/30 L [place] CVL > 8/31 8/30 R femoral arterial line >  8/31 L IJ CVL >  8/31 L chest tube >   Studies: 8/30 CT angiogram chest > no PE, bilateral lower lobe infiltrates, considerable bilateral hilar and mediastinal adenopathy 8/31 TTE >LVEF 40-45%, dyskinesis of the left ventricular, basal inferolateral wall, mitral valve regurgitation and thickening, consider TEE, aortic valve with mild sclerosis 9/01 CT chest >> improved aeration at Rt lung base and LLL, decreased adenopathy, small volume Lt PTX with chest tube in place, mild subcutaneous emphysema 9/01 MRI brain >> 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  Objective   Blood pressure 101/62, pulse 61, temperature (!) 97.2 F (36.2 C), resp. rate (!) 24, height 5\' 8"  (1.727 m), weight 68.9 kg, SpO2 100 %.    Vent Mode: PRVC FiO2 (%):  [40 %] 40 % Set Rate:  [24 bmp] 24 bmp Vt Set:  [540 mL] 540 mL PEEP:  [5 cmH20] 5 cmH20 Plateau Pressure:  [18 cmH20-22 cmH20] 22 cmH20   Intake/Output Summary (Last 24 hours) at 01/31/2021 0805 Last data filed at 01/31/2021 0700 Gross per 24 hour  Intake 3985.8 ml  Output 4684 ml  Net  -698.2 ml   Filed Weights   01/29/21 0500 01/30/21 0500 01/31/21 0420  Weight: 71.3 kg 71.7 kg 68.9 kg    Examination:  General - agitated Eyes - pupils reactive ENT - ETT in place Cardiac - regular tachycardic  Chest - b/l rhonchi Abdomen - soft, non tender, + bowel sounds Extremities - no cyanosis, clubbing, or edema Skin - no rashes Neuro - RASS + 2 to - 2  Resolved Hospital Problem list   Septic shock  Assessment & Plan:   Acute respiratory failure with hypoxemia from aspiration pneumonitis. - extubated trial 9/02 - goal SpO2 > 92%  AKI from ATN 2nd to shock. Hypokalemia, hypophosphatemia. - making urine - might be able to transition off CRRT soon >> defer to nephrology - f/u BMET  Lt pneumothorax. - continue chest tube - f/u CXR  Acute metabolic encephalopathy from hypoxia in setting of accidental drug overdose. Seizure after respiratory arrest with hypoxia. Hx of Depression, PTSD. - MRI brain findings more likely related to  - continue keppra per neurology - continue remeron, lyrica - might need precedex with prn ativan/fentanyl after extubation to control agitation  Aspiration pneumonia. Hx of HIV. - day 6 of unasyn - continue vancomycin per ID - HAART per ID  Mediastinal adenopathy. - difficult to determine significance of this - has some improvement in very short term follow up CT imaging - might just be reactive - continue current therapies and repeat imaging in several weeks, and then determine if  bronchoscopy with EBUS is needed  Mitral valve thickening. - would be reasonable to get TEE, however need to know who we should consent for this.  In the absence of bacteremia this is not urgent.  Elevated LFTs. - likely from shock and hypoxia - f/u LFTs intermittently  Anemia of critical illness. Mild thrombocytopenia likely related to sepsis. - f/u CBC  Dysphagia. - speech to assess swallowing after extubation  Deconditioning. - PT/OT  assessment after extubation   Best Practice (right click and "Reselect all SmartList Selections" daily)   Diet/type: tubefeeds and NPO DVT prophylaxis: prophylactic heparin  GI prophylaxis: PPI Lines: Central line, Dialysis Catheter, and yes and it is still needed d/c arterial line today Foley:  Yes, and it is still needed Code Status:  full code Last date of multidisciplinary goals of care discussion [n/a]  Labs    CMP Latest Ref Rng & Units 01/31/2021 01/30/2021 01/30/2021  Glucose 70 - 99 mg/dL 470(J) 628(Z) 662(H)  BUN 6 - 20 mg/dL 15 15 20   Creatinine 0.61 - 1.24 mg/dL 4.76 5.46  Sodium 135 - 145 mmol/L 139 142 135  Potassium 3.5 - 5.1 mmol/L 3.0(L) 3.1(L) 3.2(L)  Chloride 98 - 111 mmol/L 108 110 102  CO2 22 - 32 mmol/L 23 24 24   Calcium 8.9 - 10.3 mg/dL 8.0(L) 7.6(L) 7.9(L)  Total Protein 6.5 - 8.1 g/dL 5.03) - -  Total Bilirubin 0.3 - 1.2 mg/dL 1.1 - -  Alkaline Phos 38 - 126 U/L 71 - -  AST 15 - 41 U/L 560(H) - -  ALT 0 - 44 U/L 748(H) - -    CBC Latest Ref Rng & Units 01/31/2021 01/30/2021 01/29/2021  WBC 4.0 - 10.5 K/uL 8.8 11.6(H) 16.2(H)  Hemoglobin 13.0 - 17.0 g/dL 10.1(L) 11.3(L) 13.0  Hematocrit 39.0 - 52.0 % 30.4(L) 33.5(L) 39.3  Platelets 150 - 400 K/uL 120(L) 138(L) 194    ABG    Component Value Date/Time   PHART 7.444 01/29/2021 0444   PCO2ART 26.3 (L) 01/29/2021 0444   PO2ART 121 (H) 01/29/2021 0444   HCO3 18.1 (L) 01/29/2021 0444   TCO2 19 (L) 01/29/2021 0444   ACIDBASEDEF 5.0 (H) 01/29/2021 0444   O2SAT 98.4 01/29/2021 1002    CBG (last 3)  Recent Labs    01/30/21 2255 01/31/21 0411 01/31/21 0747  GLUCAP 112* 97 118*   Critical care time: 36 minutes  04/02/21, MD  [facility] Pager - [phone] 01/31/2021, 8:22 AM  [phone], MD [facility] Pager: [phone] Cell: [phone] After 7:00 pm call Elink  [phone]

## 2021-01-31 NOTE — Progress Notes (Signed)
Patient ID: Howard Rowland, male   DOB: 07/24/1990, 30 y.o.   MRN: [id number] S: lytes repleted overnight. Tolerating crrt. No acute events.uop 2.7L off pressors O:BP 101/62   Pulse 61   Temp (!) 97.2 F (36.2 C)   Resp (!) 24   Ht '5\' 8"'  (1.727 m)   Wt 68.9 kg   SpO2 100%   BMI 23.10 kg/m   Intake/Output Summary (Last 24 hours) at 01/31/2021 0747 Last data filed at 01/31/2021 0700 Gross per 24 hour  Intake 4266.07 ml  Output 5018 ml  Net -751.93 ml   Intake/Output: I/O last 3 completed shifts: In: 6090.2 [I.V.:2217.3; NG/GT:2710; IV Piggyback:1162.9] Out: 8721 [Urine:3635; Emesis/NG output:135; Other:3502; Chest Tube:15]  Intake/Output this shift:  No intake/output data recorded. Weight change: -2.8 kg LUN:GBMBOMQTT and sedated CVS:s1s2 Resp:ventilated BS bilaterally Abd: +BS, soft Ext:no edema  Recent Labs  Lab 01/28/21 1102 01/28/21 1125 01/28/21 1324 01/28/21 2244 01/29/21 0444 01/29/21 0458 01/29/21 1631 01/30/21 0321 01/30/21 1556 01/31/21 0515  NA 144   < > 139 141 143 141 138 135 142 139  K 7.3*   < > >7.5* 5.3* 4.0 4.4 3.4* 3.2* 3.1* 3.0*  CL 110   < > 112* 113*  --  101 101 102 110 108  CO2 24  --  20* 17*  --  21* '26 24 24 23  ' GLUCOSE 90   < > 139* 155*  --  199* 124* 156* 126* 116*  BUN 12   < > 14 19  --  '18 17 20 15 15  ' CREATININE 3.09*   < > 3.08* 2.73*  --  2.26* 1.42* 1.09 0.91 0.89  ALBUMIN 3.7  --  2.9*  --   --  3.0* 2.9* 3.0* 2.7* 2.9*  CALCIUM 7.2*  --  6.4* 6.5*  --  7.3* 7.8* 7.9* 7.6* 8.0*  PHOS  --   --  5.1*  --   --  3.8 3.2 1.7* 1.6* 1.2*  AST 103*  --  129*  --   --   --   --   --   --  560*  ALT 117*  --  106*  --   --   --   --   --   --  748*   < > = values in this interval not displayed.   Liver Function Tests: Recent Labs  Lab 01/28/21 1102 01/28/21 1324 01/29/21 0458 01/30/21 0321 01/30/21 1556 01/31/21 0515  AST 103* 129*  --   --   --  560*  ALT 117* 106*  --   --   --  748*  ALKPHOS 71 56  --   --   --  71  BILITOT  0.7 0.9  --   --   --  1.1  PROT 6.3* 4.8*  --   --   --  5.5*  ALBUMIN 3.7 2.9*   < > 3.0* 2.7* 2.9*   < > = values in this interval not displayed.   Recent Labs  Lab 01/28/21 1324  LIPASE 22  AMYLASE 537*   Recent Labs  Lab 01/28/21 1116  AMMONIA 75*   CBC: Recent Labs  Lab 01/28/21 1102 01/28/21 1125 01/28/21 1324 01/29/21 0444 01/29/21 0458 01/30/21 0321 01/31/21 0515  WBC 21.7*  --  21.0*  --  16.2* 11.6* 8.8  NEUTROABS 15.6*  --   --   --   --   --  6.1  HGB 13.1   < >  13.6   < > 13.0 11.3* 10.1*  HCT 42.6   < > 44.8   < > 39.3 33.5* 30.4*  MCV 87.7  --  88.5  --  82.2 80.0 81.3  PLT 416*  --  250  --  194 138* 120*   < > = values in this interval not displayed.   Cardiac Enzymes: Recent Labs  Lab 01/28/21 1324  CKTOTAL 16,288*   CBG: Recent Labs  Lab 01/30/21 1126 01/30/21 1456 01/30/21 1945 01/30/21 2255 01/31/21 0411  GLUCAP 161* 123* 110* 112* 97    Iron Studies: No results for input(s): IRON, TIBC, TRANSFERRIN, FERRITIN in the last 72 hours. Studies/Results: CT CHEST WO CONTRAST  Result Date: 01/30/2021 CLINICAL DATA:  Foreign body suspected, chest EXAM: CT CHEST WITHOUT CONTRAST TECHNIQUE: Multidetector CT imaging of the chest was performed following the standard protocol without IV contrast. COMPARISON:  Recent chest CT and chest radiographs FINDINGS: Cardiovascular: Partially imaged right IJ central line extending into the SVC. Partially imaged presumed left IJ central line likely extending into the azygous vein. There is no retained catheter fragment identified. Normal heart size. No pericardial effusion. Thoracic aorta is normal in caliber. Mediastinum/Nodes: Endotracheal and enteric tubes are present. Mediastinal and hilar adenopathy is present though better evaluated on the prior contrast study. Top-normal axillary nodes as well. Lungs/Pleura: Improved aeration at the right lung base with persistent dependent right lower lobe  atelectasis/consolidation. Slightly increased left lower lobe atelectasis/consolidation. Small volume left pneumothorax along the medial pleural margin. A left chest tube is present laterally. Upper Abdomen: No acute abnormality. Musculoskeletal: Minimal subcutaneous emphysema related to chest tube placement. IMPRESSION: Lines and tubes as above. There is no retained catheter fragment identified. Improved aeration at the right lung base with persistent right lower lobe atelectasis/consolidation. Slightly increased left lower lobe atelectasis/consolidation. Small volume left pneumothorax along the medial pleural margin. A left chest tube is present laterally. Electronically Signed   By: Macy Mis M.D.   On: 01/30/2021 12:43   MR BRAIN W WO CONTRAST  Result Date: 01/30/2021 CLINICAL DATA:  Altered mental status, CNS infection in the setting of seizure and HIV medication noncompliance EXAM: MRI HEAD WITHOUT AND WITH CONTRAST TECHNIQUE: Multiplanar, multiecho pulse sequences of the brain and surrounding structures were obtained without and with intravenous contrast. CONTRAST:  53m GADAVIST GADOBUTROL 1 MMOL/ML IV SOLN COMPARISON:  CT head 01/28/2021 FINDINGS: Brain: There is confluent FLAIR signal abnormality throughout the subcortical white matter bilaterally in a symmetric distribution. The subcortical U fibers are spared. There is associated diffusion restriction. There is no associated enhancement. There is no intracranial hemorrhage. There is no mass effect or midline shift. Vascular: Normal flow voids. Skull and upper cervical spine: Normal marrow signal. Sinuses/Orbits: Negative. Other: None. IMPRESSION: Extensive symmetric T2/FLAIR signal abnormality throughout the supratentorial white matter described above with associated diffusion restriction but no postcontrast enhancement. 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 Electronically  Signed   By: PValetta MoleM.D.   On: 01/30/2021 13:54   DG Chest Port 1 View  Result Date: 01/31/2021 CLINICAL DATA:  Acute respiratory failure. EXAM: PORTABLE CHEST 1 VIEW COMPARISON:  01/29/2021 FINDINGS: Endotracheal tube tip is 6.3 cm above the base of the carina. The NG tube passes into the stomach although the distal tip position is not included on the film. Left pleural drain remains in place with no residual left-sided pneumothorax visible on the current study. Left IJ central line tip  overlies the proximal SVC level. Right IJ central line tip overlies the mid SVC level. The right lung clear. The cardiopericardial silhouette is within normal limits for size. Telemetry leads overlie the chest. IMPRESSION: 1. Tiny apical left pneumothorax seen previously is not visible on the current study. 2. Otherwise no substantial interval change. Electronically Signed   By: Misty Stanley M.D.   On: 01/31/2021 05:05   DG CHEST PORT 1 VIEW  Result Date: 01/29/2021 CLINICAL DATA:  Chest tube placement EXAM: PORTABLE CHEST 1 VIEW COMPARISON:  01/29/2021 at 1129 hours FINDINGS: Interval placement of a left sided pigtail pleural drainage catheter. Endotracheal tube positioned within the midthoracic trachea. Left and right internal jugular approach central venous catheters are stable in positioning terminating at the level of the distal SVC. An enteric tube courses below the diaphragm, distal tip beyond the inferior margin of the film. Stable heart size. Interval reduction in size of left-sided pneumothorax. Trace left apical pneumothorax remains, less than 5% size. Slightly improved aeration of the lung fields. Bilateral hilar lymphadenopathy. IMPRESSION: 1. Interval placement of left-sided chest tube with reduction in size of left-sided pneumothorax. Trace left apical pneumothorax remains, less than 5% size. 2. Improving aeration of the lung fields. Otherwise stable radiographic appearance of the chest. 3. Lines and  tubes as above. Electronically Signed   By: Davina Poke D.O.   On: 01/29/2021 14:45   DG CHEST PORT 1 VIEW  Result Date: 01/29/2021 CLINICAL DATA:  30 year old male status post new central line placement. Unresponsive patient. EXAM: PORTABLE CHEST 1 VIEW COMPARISON:  Chest x-ray 01/28/2021. FINDINGS: New left internal jugular central venous catheter with tip projecting over the distal superior vena cava. There is a left-sided subclavian central venous catheter with tip terminating in the proximal to mid superior vena cava. An endotracheal tube is in place with tip 6.9 cm above the carina. Right IJ Vas-Cath with tip terminating in the distal superior vena cava an additional small bore catheter or fragment of a catheter is noted projecting over the upper mediastinum measuring approximately 2 cm in length. A nasogastric tube is seen extending into the stomach, however, the tip of the nasogastric tube extends below the lower margin of the image. New large left-sided pneumothorax occupying greater than 50% of the volume of the left hemithorax. Patchy ill-defined opacities and areas of interstitial prominence throughout the right mid to lower lung. No right pneumothorax. No evidence of pulmonary edema. Heart size is borderline enlarged. Upper mediastinal contours are within normal limits. IMPRESSION: 1. New large left-sided pneumothorax following placement of left IJ central venous catheter. 2. Additional support apparatus, as above. Please take note of the apparent catheter fragment projecting over the upper mediastinum. Alternatively, this could be exterior to the patient. Clinical correlation is recommended. 3. Patchy ill-defined opacities and areas of interstitial prominence in the right mid to lower lung which could reflect developing aspiration pneumonitis. Attention on follow-up studies is recommended. Critical Value/emergent results were called by telephone at the time of interpretation on 01/29/2021 at  12:15 pm to provider nurse Arthur Holms for Dr. Jacky Kindle, who verbally acknowledged these results. Electronically Signed   By: Vinnie Langton M.D.   On: 01/29/2021 12:18   Overnight EEG with video  Result Date: 01/29/2021 Lora Havens, MD     01/30/2021  8:32 AM Patient Name: Viraat Vanpatten MRN: [id number] Epilepsy Attending: Lora Havens Referring Physician/Provider: Dr Su Monks Duration: 01/28/2021 1421 to 01/29/2021 1421  Patient history: 30yo M with  ams. EEG to evaluate for seizure  Level of alertness: lethargic/sedated  AEDs during EEG study: LEV  Technical aspects: This EEG study was done with scalp electrodes positioned according to the 10-20 International system of electrode placement. Electrical activity was acquired at a sampling rate of '500Hz'  and reviewed with a high frequency filter of '70Hz'  and a low frequency filter of '1Hz' . EEG data were recorded continuously and digitally stored.  Description: EEG showed continuous generalized polymorphic sharply contoured 3 to 6 Hz theta-delta slowing. Hyperventilation and photic stimulation were not performed.    ABNORMALITY - Continuous slow, generalized  IMPRESSION: This study is suggestive of moderate diffuse encephalopathy, nonspecific etiology. No seizures or epileptiform discharges were seen throughout the recording.   Lora Havens   ECHOCARDIOGRAM COMPLETE  Result Date: 01/29/2021    ECHOCARDIOGRAM REPORT   Patient Name:   TRENT GABLER Date of Exam: 01/29/2021 Medical Rec #:  [id number]     Height:       68.0 in Accession #:    [id number]    Weight:       157.2 lb Date of Birth:  May 10, 1991     BSA:          1.845 m Patient Age:    30 years      BP:           86/62 mmHg Patient Gender: M             HR:           73 bpm. Exam Location:  Inpatient Procedure: 2D Echo, Cardiac Doppler and Color Doppler Indications:    Elevated troponin  History:        Patient has prior history of Echocardiogram examinations and                 Patient  has no prior history of Echocardiogram examinations.                 Signs/Symptoms:Resp. failure. Multi-organ failure, HIV,                 polysubstance abuse, narcotic overdose, Elevated troponin.  Sonographer:    Dustin Flock RDCS Referring Phys: [place]  1. Left ventricular ejection fraction, by estimation, is 40 to 45%. The left ventricle has mildly decreased function. The left ventricle demonstrates regional wall motion abnormalities (see scoring diagram/findings for description). Left ventricular diastolic parameters were normal. There is dyskinesis of the left ventricular, basal inferolateral wall and anterolateral wall.  2. Right ventricular systolic function is mildly reduced. The right ventricular size is normal. There is normal pulmonary artery systolic pressure.  3. The mitral valve is abnormal. Trivial mitral valve regurgitation. No evidence of mitral stenosis.  4. The aortic valve is tricuspid. Aortic valve regurgitation is not visualized. Mild aortic valve sclerosis is present, with no evidence of aortic valve stenosis.  5. The inferior vena cava is normal in size with greater than 50% respiratory variability, suggesting right atrial pressure of 3 mmHg. Comparison(s): No prior Echocardiogram. Conclusion(s)/Recommendation(s): Global mild hypokinesis with focal dyskinesis of the basal lateral wall. There is also thickening of the mitral valve concerning for endocarditis, cannot be fully evaluated on this study. Consider TEE. Findings and recommendations communicated to Dr. Lake Bells. FINDINGS  Left Ventricle: Left ventricular ejection fraction, by estimation, is 40 to 45%. The left ventricle has mildly decreased function. The left ventricle demonstrates regional wall motion abnormalities. The left ventricular internal cavity size was normal  in size. There is no left ventricular hypertrophy. Left ventricular diastolic parameters were normal. Right Ventricle: The right  ventricular size is normal. Right vetricular wall thickness was not well visualized. Right ventricular systolic function is mildly reduced. There is normal pulmonary artery systolic pressure. The tricuspid regurgitant velocity is 1.80 m/s, and with an assumed right atrial pressure of 3 mmHg, the estimated right ventricular systolic pressure is 82.9 mmHg. Left Atrium: Left atrial size was normal in size. Right Atrium: Right atrial size was normal in size. Pericardium: There is no evidence of pericardial effusion. Mitral Valve: There is a thickening on the posterior leaflet of the mitral valve that extends into the left atrium. Concerning for endocarditis. The mitral valve is abnormal. There is moderate thickening of the mitral valve leaflet(s). Trivial mitral valve regurgitation. No evidence of mitral valve stenosis. Tricuspid Valve: The tricuspid valve is normal in structure. Tricuspid valve regurgitation is mild . No evidence of tricuspid stenosis. Aortic Valve: The aortic valve is tricuspid. Aortic valve regurgitation is not visualized. Mild aortic valve sclerosis is present, with no evidence of aortic valve stenosis. Pulmonic Valve: The pulmonic valve was not well visualized. Pulmonic valve regurgitation is not visualized. No evidence of pulmonic stenosis. Aorta: The aortic root and ascending aorta are structurally normal, with no evidence of dilitation and the aortic arch was not well visualized. Venous: The inferior vena cava is normal in size with greater than 50% respiratory variability, suggesting right atrial pressure of 3 mmHg. IAS/Shunts: The atrial septum is grossly normal.  LEFT VENTRICLE PLAX 2D LVIDd:         4.00 cm  Diastology LVIDs:         3.00 cm  LV e' medial:    5.44 cm/s LV PW:         1.10 cm  LV E/e' medial:  9.9 LV IVS:        1.00 cm  LV e' lateral:   4.68 cm/s LVOT diam:     2.10 cm  LV E/e' lateral: 11.5 LV SV:         32 LV SV Index:   17 LVOT Area:     3.46 cm  RIGHT VENTRICLE RV  Basal diam:  3.00 cm RV S prime:     6.31 cm/s TAPSE (M-mode): 1.1 cm LEFT ATRIUM           Index       RIGHT ATRIUM          Index LA diam:      3.10 cm 1.68 cm/m  RA Area:     9.97 cm LA Vol (A2C): 28.5 ml 15.45 ml/m RA Volume:   20.50 ml 11.11 ml/m LA Vol (A4C): 22.7 ml 12.30 ml/m  AORTIC VALVE LVOT Vmax:   45.80 cm/s LVOT Vmean:  31.000 cm/s LVOT VTI:    0.092 m  AORTA Ao Root diam: 3.50 cm MITRAL VALVE               TRICUSPID VALVE MV Area (PHT): 4.06 cm    TR Peak grad:   13.0 mmHg MV Decel Time: 187 msec    TR Vmax:        180.00 cm/s MV E velocity: 53.60 cm/s MV A velocity: 31.20 cm/s  SHUNTS MV E/A ratio:  1.72        Systemic VTI:  0.09 m  Systemic Diam: 2.10 cm Buford Dresser MD Electronically signed by Buford Dresser MD Signature Date/Time: 01/29/2021/1:31:35 PM    Final     chlorhexidine gluconate (MEDLINE KIT)  15 mL Mouth Rinse BID   Chlorhexidine Gluconate Cloth  6 each Topical Daily   Chlorhexidine Gluconate Cloth  6 each Topical Daily   docusate  100 mg Per Tube BID   dolutegravir  50 mg Per Tube Daily   emtricitabine-tenofovir AF  1 tablet Per Tube Daily   feeding supplement (PROSource TF)  90 mL Per Tube TID   heparin injection (subcutaneous)  5,000 Units Subcutaneous Q8H   mouth rinse  15 mL Mouth Rinse 10 times per day   mirtazapine  45 mg Per Tube QHS   pantoprazole (PROTONIX) IV  40 mg Intravenous Q12H   polyethylene glycol  17 g Per Tube Daily   potassium chloride  40 mEq Per Tube Q4H   pregabalin  75 mg Per Tube BID   sodium chloride flush  10-40 mL Intracatheter Q12H    BMET    Component Value Date/Time   NA 139 01/31/2021 0515   K 3.0 (L) 01/31/2021 0515   CL 108 01/31/2021 0515   CO2 23 01/31/2021 0515   GLUCOSE 116 (H) 01/31/2021 0515   BUN 15 01/31/2021 0515   CREATININE 0.89 01/31/2021 0515   CALCIUM 8.0 (L) 01/31/2021 0515   GFRNONAA >60 01/31/2021 0515   CBC    Component Value Date/Time   WBC 8.8  01/31/2021 0515   RBC 3.74 (L) 01/31/2021 0515   HGB 10.1 (L) 01/31/2021 0515   HCT 30.4 (L) 01/31/2021 0515   PLT 120 (L) 01/31/2021 0515   MCV 81.3 01/31/2021 0515   MCH 27.0 01/31/2021 0515   MCHC 33.2 01/31/2021 0515   RDW 14.6 01/31/2021 0515   LYMPHSABS 1.9 01/31/2021 0515   MONOABS 0.6 01/31/2021 0515   EOSABS 0.1 01/31/2021 0515   BASOSABS 0.0 01/31/2021 0515    Assessment/Plan:  AKI - presumably due to combination of rhabdomyolysis and ischemic ATN in setting of shock, however toxicology studies are pending.   CRRT started 01/28/21 after RIJ trialysis catheter placed by PCCM Urine output continues to increase. Possible that he will continue to have renal recovery. Will stop CRRT/rinse back now, discussed with RN. Will monitor for further renal replacement therapy needs Acute hypoxic respiratory failure with hypercapnia - s/p intubation, management per PCCM Shock - unclear if sepsis vs cardiogenic vs combination - s/p LP to r/o meningitis and started on ampicillin, cefepime, and vancomycin.  off pressors Pulmonary lymphadenopathy - workup per PCCM, possible bronch. Hyperkalemia - due to #1. Improved with CVVHD . Now hypokalemic Hypophosphatemia - replete prn Acute metabolic encephalopathy - s/p LP, Neuro consulted.  EEG with nonspecific moderate to severe diffuse encephalopathy.  No seizure activity seen.  For MRI of brain when more stable. HIV - was on BIKTARVY but compliance was sporadic. Non anion gap metabolic acidosis - toxicology studies pending and will use bicarbonate with CVVHD. improved  Gean Quint, MD [employer]

## 2021-02-01 ENCOUNTER — Inpatient Hospital Stay (HOSPITAL_COMMUNITY): Payer: No Typology Code available for payment source

## 2021-02-01 DIAGNOSIS — G9341 Metabolic encephalopathy: Secondary | ICD-10-CM | POA: Diagnosis not present

## 2021-02-01 LAB — GLUCOSE, CAPILLARY
Glucose-Capillary: 75 mg/dL (ref 70–99)
Glucose-Capillary: 79 mg/dL (ref 70–99)
Glucose-Capillary: 77 mg/dL (ref 70–99)
Glucose-Capillary: 76 mg/dL (ref 70–99)
Glucose-Capillary: 74 mg/dL (ref 70–99)
Glucose-Capillary: 70 mg/dL (ref 70–99)

## 2021-02-01 LAB — RENAL FUNCTION PANEL
Albumin: 2.7 g/dL — ABNORMAL LOW (ref 3.5–5.0)
Anion gap: 6 (ref 5–15)
BUN: 11 mg/dL (ref 6–20)
CO2: 28 mmol/L (ref 22–32)
Calcium: 8 mg/dL — ABNORMAL LOW (ref 8.9–10.3)
Chloride: 105 mmol/L (ref 98–111)
Creatinine, Ser: 1.06 mg/dL (ref 0.61–1.24)
GFR, Estimated: >60 mL/min (ref >60)
Glucose, Bld: 80 mg/dL (ref 70–99)
Phosphorus: 3.4 mg/dL (ref 2.5–4.6)
Potassium: 3.8 mmol/L (ref 3.5–5.1)
Sodium: 139 mmol/L (ref 135–145)

## 2021-02-01 LAB — CBC
HCT: 31.6 % — ABNORMAL LOW (ref 39.0–52.0)
Hemoglobin: 10 g/dL — ABNORMAL LOW (ref 13.0–17.0)
MCH: 26.9 pg (ref 26.0–34.0)
MCHC: 31.6 g/dL (ref 30.0–36.0)
MCV: 84.9 fL (ref 80.0–100.0)
Platelets: 120 10*3/uL — ABNORMAL LOW (ref 150–400)
RBC: 3.72 MIL/uL — ABNORMAL LOW (ref 4.22–5.81)
RDW: 14.5 % (ref 11.5–15.5)
WBC: 7.4 10*3/uL (ref 4.0–10.5)
nRBC: 0 % (ref 0.0–0.2)

## 2021-02-01 LAB — CSF CULTURE W GRAM STAIN
Culture: NO GROWTH
Gram Stain: NONE SEEN

## 2021-02-01 LAB — BLASTOMYCES ANTIGEN: Blastomyces Antigen: NOT DETECTED ng/mL

## 2021-02-01 LAB — MAGNESIUM: Magnesium: 2.1 mg/dL (ref 1.7–2.4)

## 2021-02-01 MED ORDER — CLONIDINE HCL 0.2 MG/24HR TD PTWK
0.2000 mg | MEDICATED_PATCH | TRANSDERMAL | Status: DC
  Administered 2021-02-01: 0.2 mg via TRANSDERMAL
  Filled 2021-02-01 (×3): qty 1

## 2021-02-01 NOTE — Progress Notes (Signed)
PT Cancellation Note  Patient Details Name: Oluwaferanmi Wain MRN: [id number] DOB: 1990/10/18   Cancelled Treatment:    Reason Eval/Treat Not Completed: Medical issues which prohibited therapy remain this afternoon. Pt remains unable to maintain alertness or follow commands at this time despite weaning of sedation per RN. PT will continue to follow and evaluate as appropriate.   Vickki Muff, PT, DPT   Acute Rehabilitation Department Pager #: [phone]   Ronnie Derby 02/01/2021, 4:17 PM

## 2021-02-01 NOTE — Progress Notes (Signed)
Patient ID: Howard Rowland, male   DOB: 04-24-1991, 30 y.o.   MRN: [id number] S: remains off pressors, extubated yesterday. No acute events, restraints in plan O:BP 105/62   Pulse 74   Temp 99.1 F (37.3 C)   Resp 12   Ht '5\' 8"'  (1.727 m)   Wt 68 kg   SpO2 100%   BMI 22.79 kg/m   Intake/Output Summary (Last 24 hours) at 02/01/2021 0745 Last data filed at 02/01/2021 0600 Gross per 24 hour  Intake 1788.79 ml  Output 1958 ml  Net -169.21 ml   Intake/Output: I/O last 3 completed shifts: In: 3604.2 [I.V.:945.1; NG/GT:865; IV Piggyback:1784.1] Out: 8676 [Urine:2320; HMCNO:7096; Chest Tube:100]  Intake/Output this shift:  No intake/output data recorded. Weight change: -0.9 kg Gen: drowsy CVS: rrr Resp: normal wob Abd: +soft, ND Ext:no edema  Recent Labs  Lab 01/28/21 1102 01/28/21 1125 01/28/21 1324 01/28/21 2244 01/29/21 0444 01/29/21 0458 01/29/21 1631 01/30/21 0321 01/30/21 1556 01/31/21 0515 02/01/21 0516  NA 144   < > 139 141 143 141 138 135 142 139 139  K 7.3*   < > >7.5* 5.3* 4.0 4.4 3.4* 3.2* 3.1* 3.0* 3.8  CL 110   < > 112* 113*  --  101 101 102 110 108 105  CO2 24  --  20* 17*  --  21* '26 24 24 23 28  ' GLUCOSE 90   < > 139* 155*  --  199* 124* 156* 126* 116* 80  BUN 12   < > 14 19  --  '18 17 20 15 15 11  ' CREATININE 3.09*   < > 3.08* 2.73*  --  2.26* 1.42* 1.09 0.91 0.89 1.06  ALBUMIN 3.7  --  2.9*  --   --  3.0* 2.9* 3.0* 2.7* 2.9* 2.7*  CALCIUM 7.2*  --  6.4* 6.5*  --  7.3* 7.8* 7.9* 7.6* 8.0* 8.0*  PHOS  --   --  5.1*  --   --  3.8 3.2 1.7* 1.6* 1.2* 3.4  AST 103*  --  129*  --   --   --   --   --   --  560*  --   ALT 117*  --  106*  --   --   --   --   --   --  748*  --    < > = values in this interval not displayed.   Liver Function Tests: Recent Labs  Lab 01/28/21 1102 01/28/21 1324 01/29/21 0458 01/30/21 1556 01/31/21 0515 02/01/21 0516  AST 103* 129*  --   --  560*  --   ALT 117* 106*  --   --  748*  --   ALKPHOS 71 56  --   --  71  --    BILITOT 0.7 0.9  --   --  1.1  --   PROT 6.3* 4.8*  --   --  5.5*  --   ALBUMIN 3.7 2.9*   < > 2.7* 2.9* 2.7*   < > = values in this interval not displayed.   Recent Labs  Lab 01/28/21 1324  LIPASE 22  AMYLASE 537*   Recent Labs  Lab 01/28/21 1116  AMMONIA 75*   CBC: Recent Labs  Lab 01/28/21 1102 01/28/21 1125 01/28/21 1324 01/29/21 0444 01/29/21 0458 01/30/21 0321 01/31/21 0515 02/01/21 0516  WBC 21.7*  --  21.0*  --  16.2* 11.6* 8.8 7.4  NEUTROABS 15.6*  --   --   --   --   --  6.1  --   HGB 13.1   < > 13.6   < > 13.0 11.3* 10.1* 10.0*  HCT 42.6   < > 44.8   < > 39.3 33.5* 30.4* 31.6*  MCV 87.7  --  88.5  --  82.2 80.0 81.3 84.9  PLT 416*  --  250  --  194 138* 120* 120*   < > = values in this interval not displayed.   Cardiac Enzymes: Recent Labs  Lab 01/28/21 1324  CKTOTAL 16,288*   CBG: Recent Labs  Lab 01/31/21 1900 01/31/21 2259 01/31/21 2321 02/01/21 0305 02/01/21 0708  GLUCAP 74 68* 93 75 79    Iron Studies: No results for input(s): IRON, TIBC, TRANSFERRIN, FERRITIN in the last 72 hours. Studies/Results: CT CHEST WO CONTRAST  Result Date: 01/30/2021 CLINICAL DATA:  Foreign body suspected, chest EXAM: CT CHEST WITHOUT CONTRAST TECHNIQUE: Multidetector CT imaging of the chest was performed following the standard protocol without IV contrast. COMPARISON:  Recent chest CT and chest radiographs FINDINGS: Cardiovascular: Partially imaged right IJ central line extending into the SVC. Partially imaged presumed left IJ central line likely extending into the azygous vein. There is no retained catheter fragment identified. Normal heart size. No pericardial effusion. Thoracic aorta is normal in caliber. Mediastinum/Nodes: Endotracheal and enteric tubes are present. Mediastinal and hilar adenopathy is present though better evaluated on the prior contrast study. Top-normal axillary nodes as well. Lungs/Pleura: Improved aeration at the right lung base with  persistent dependent right lower lobe atelectasis/consolidation. Slightly increased left lower lobe atelectasis/consolidation. Small volume left pneumothorax along the medial pleural margin. A left chest tube is present laterally. Upper Abdomen: No acute abnormality. Musculoskeletal: Minimal subcutaneous emphysema related to chest tube placement. IMPRESSION: Lines and tubes as above. There is no retained catheter fragment identified. Improved aeration at the right lung base with persistent right lower lobe atelectasis/consolidation. Slightly increased left lower lobe atelectasis/consolidation. Small volume left pneumothorax along the medial pleural margin. A left chest tube is present laterally. Electronically Signed   By: Macy Mis M.D.   On: 01/30/2021 12:43   MR BRAIN W WO CONTRAST  Result Date: 01/30/2021 CLINICAL DATA:  Altered mental status, CNS infection in the setting of seizure and HIV medication noncompliance EXAM: MRI HEAD WITHOUT AND WITH CONTRAST TECHNIQUE: Multiplanar, multiecho pulse sequences of the brain and surrounding structures were obtained without and with intravenous contrast. CONTRAST:  29m GADAVIST GADOBUTROL 1 MMOL/ML IV SOLN COMPARISON:  CT head 01/28/2021 FINDINGS: Brain: There is confluent FLAIR signal abnormality throughout the subcortical white matter bilaterally in a symmetric distribution. The subcortical U fibers are spared. There is associated diffusion restriction. There is no associated enhancement. There is no intracranial hemorrhage. There is no mass effect or midline shift. Vascular: Normal flow voids. Skull and upper cervical spine: Normal marrow signal. Sinuses/Orbits: Negative. Other: None. IMPRESSION: Extensive symmetric T2/FLAIR signal abnormality throughout the supratentorial white matter described above with associated diffusion restriction but no postcontrast enhancement. 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 Electronically Signed   By: PValetta MoleM.D.   On: 01/30/2021 13:54   DG Chest Port 1 View  Result Date: 02/01/2021 CLINICAL DATA:  Follow-up pneumothorax EXAM: PORTABLE CHEST 1 VIEW COMPARISON:  Chest radiograph from one day prior. FINDINGS: Interval extubation. Stable right and left internal jugular central venous catheters. Stable pigtail left chest tube. Stable cardiomediastinal silhouette with top-normal heart size. No pneumothorax. No pleural effusion. No pulmonary edema. Mild  scarring versus atelectasis at the left costophrenic angle. No acute consolidative airspace disease. IMPRESSION: 1. No pneumothorax. Stable left chest tube. 2. Mild scarring versus atelectasis at the left costophrenic angle. Electronically Signed   By: Ilona Sorrel M.D.   On: 02/01/2021 06:19   DG Chest Port 1 View  Result Date: 01/31/2021 CLINICAL DATA:  Acute respiratory failure. EXAM: PORTABLE CHEST 1 VIEW COMPARISON:  01/29/2021 FINDINGS: Endotracheal tube tip is 6.3 cm above the base of the carina. The NG tube passes into the stomach although the distal tip position is not included on the film. Left pleural drain remains in place with no residual left-sided pneumothorax visible on the current study. Left IJ central line tip overlies the proximal SVC level. Right IJ central line tip overlies the mid SVC level. The right lung clear. The cardiopericardial silhouette is within normal limits for size. Telemetry leads overlie the chest. IMPRESSION: 1. Tiny apical left pneumothorax seen previously is not visible on the current study. 2. Otherwise no substantial interval change. Electronically Signed   By: Misty Stanley M.D.   On: 01/31/2021 05:05    chlorhexidine gluconate (MEDLINE KIT)  15 mL Mouth Rinse BID   Chlorhexidine Gluconate Cloth  6 each Topical Daily   dolutegravir  50 mg Oral Daily   emtricitabine-tenofovir AF  1 tablet Oral Daily   heparin injection (subcutaneous)  5,000 Units  Subcutaneous Q8H   mouth rinse  15 mL Mouth Rinse q12n4p   mirtazapine  45 mg Oral QHS   pregabalin  75 mg Oral BID   sodium chloride flush  10-40 mL Intracatheter Q12H    BMET    Component Value Date/Time   NA 139 02/01/2021 0516   K 3.8 02/01/2021 0516   CL 105 02/01/2021 0516   CO2 28 02/01/2021 0516   GLUCOSE 80 02/01/2021 0516   BUN 11 02/01/2021 0516   CREATININE 1.06 02/01/2021 0516   CALCIUM 8.0 (L) 02/01/2021 0516   GFRNONAA >60 02/01/2021 0516   CBC    Component Value Date/Time   WBC 7.4 02/01/2021 0516   RBC 3.72 (L) 02/01/2021 0516   HGB 10.0 (L) 02/01/2021 0516   HCT 31.6 (L) 02/01/2021 0516   PLT 120 (L) 02/01/2021 0516   MCV 84.9 02/01/2021 0516   MCH 26.9 02/01/2021 0516   MCHC 31.6 02/01/2021 0516   RDW 14.5 02/01/2021 0516   LYMPHSABS 1.9 01/31/2021 0515   MONOABS 0.6 01/31/2021 0515   EOSABS 0.1 01/31/2021 0515   BASOSABS 0.0 01/31/2021 0515    Assessment/Plan:  AKI - presumably due to combination of rhabdomyolysis and ischemic ATN in setting of shock, however toxicology studies are pending.   CRRT started 01/28/21 after RIJ trialysis catheter placed by PCCM. Stopped CRRT 9/2 Cr stable, UOP adequate, no indications for renal replacement therapy at the moment. Will monitor for further renal replacement therapy needs Acute hypoxic respiratory failure with hypercapnia - s/p intubation, management per PCCM-extubated 9/2 Shock - unclear if sepsis vs cardiogenic vs combination - s/p LP to r/o meningitis and started on ampicillin, cefepime, and vancomycin.  off pressors Pulmonary lymphadenopathy - workup per PCCM, possible bronch. Hyperkalemia - due to #1. Improved with CVVHD . K WNL Hypophosphatemia - replete prn Acute metabolic encephalopathy - s/p LP, Neuro consulted.  EEG with nonspecific moderate to severe diffuse encephalopathy.  No seizure activity seen.  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 Non anion gap metabolic acidosis - improved w/ crrt  Gean Quint, MD [facility]

## 2021-02-01 NOTE — Progress Notes (Signed)
Neurology Progress Note  Brief HPI: 30 y.o. male with PMHx of polysubstance abuse, PTSD, HIV, and MDD who presented to the ED via EMS after being found unresponsive by a bystander s/p Narcan administration with subsequent altered mental status with obtundation. On arrival to the ED, he was found to be hypoxic on a nonrebreather with labored breathing and coarse breath sounds with a witnessed episode of seizure activity s/p lorazepam administration and subsequent intubation for airway protection. MRI brain wwo showed extensive symmetric T2/FLAIR hyperintensity in the bilateral supratentorial white matter associated with diffusion restriction but no contrast enhancement most c/w acute toxic leukoencephalopathy.  S: Extubated, sedated in 4 pt restraints. Ativan 2/2 agitation. Not following commands for me. Protecting airway.  Exam: Vitals:   02/01/21 1700 02/01/21 1800  BP: 134/80 (!) 137/91  Pulse: 100 (!) 113  Resp: 20 13  Temp:  97.9 F (36.6 C)  SpO2: 100% 99%   Exam MS: awake, intermittently attends to examiner, will not follow simple commands Resp: normal WOB CV: RRR  Neurologic MS: MS: awake, intermittently attends to examiner, will not follow simple commands Speech: no intelligible speech CN: Pupils 26mm ERRL, (+) corneals, EOMI, face symmetric Motor: moves all extremities spontaneously with apparent full strength Sensory: SILT  Pertinent Labs: CBC    Component Value Date/Time   WBC 7.4 02/01/2021 0516   RBC 3.72 (L) 02/01/2021 0516   HGB 10.0 (L) 02/01/2021 0516   HCT 31.6 (L) 02/01/2021 0516   PLT 120 (L) 02/01/2021 0516   MCV 84.9 02/01/2021 0516   MCH 26.9 02/01/2021 0516   MCHC 31.6 02/01/2021 0516   RDW 14.5 02/01/2021 0516   LYMPHSABS 1.9 01/31/2021 0515   MONOABS 0.6 01/31/2021 0515   EOSABS 0.1 01/31/2021 0515   BASOSABS 0.0 01/31/2021 0515   CMP     Component Value Date/Time   NA 139 02/01/2021 0516   K 3.8 02/01/2021 0516   CL 105 02/01/2021 0516    CO2 28 02/01/2021 0516   GLUCOSE 80 02/01/2021 0516   BUN 11 02/01/2021 0516   CREATININE 1.06 02/01/2021 0516   CALCIUM 8.0 (L) 02/01/2021 0516   PROT 5.5 (L) 01/31/2021 0515   ALBUMIN 2.7 (L) 02/01/2021 0516   AST 560 (H) 01/31/2021 0515   ALT 748 (H) 01/31/2021 0515   ALKPHOS 71 01/31/2021 0515   BILITOT 1.1 01/31/2021 0515   GFRNONAA >60 02/01/2021 0516     Ref. Range 01/28/2021 13:04 01/28/2021 13:04  Appearance, CSF Latest Ref Range: CLEAR  CLEAR CLEAR  RBC Count, CSF Latest Ref Range: 0 /cu mm 1 (H) 99 (H)  WBC, CSF Latest Ref Range: 0 - 5 /cu mm 1 1  Other Cells, CSF Unknown TOO FEW TO COUNT, SMEAR AVAILABLE FOR REVIEW TOO FEW TO COUNT, SMEAR AVAILABLE FOR REVIEW  Color, CSF Latest Ref Range: COLORLESS  COLORLESS COLORLESS  Supernatant Unknown NOT INDICATED NOT INDICATED  Tube # Unknown 4 1   Drugs of Abuse     Component Value Date/Time   LABOPIA POSITIVE (A) 01/28/2021 1215   COCAINSCRNUR NONE DETECTED 01/28/2021 1215   LABBENZ NONE DETECTED 01/28/2021 1215   AMPHETMU NONE DETECTED 01/28/2021 1215   THCU NONE DETECTED 01/28/2021 1215   LABBARB NONE DETECTED 01/28/2021 1215    Urinalysis    Component Value Date/Time   COLORURINE AMBER (A) 01/28/2021 1215   APPEARANCEUR CLOUDY (A) 01/28/2021 1215   LABSPEC 1.017 01/28/2021 1215   PHURINE 5.0 01/28/2021 1215   GLUCOSEU NEGATIVE 01/28/2021 1215  HGBUR LARGE (A) 01/28/2021 1215   BILIRUBINUR NEGATIVE 01/28/2021 1215   KETONESUR NEGATIVE 01/28/2021 1215   PROTEINUR 30 (A) 01/28/2021 1215   NITRITE NEGATIVE 01/28/2021 1215   LEUKOCYTESUR NEGATIVE 01/28/2021 1215     Ref. Range 01/28/2021 12:15  Bacteria, UA Latest Ref Range: NONE SEEN  FEW (A)  Hyaline Casts, UA Unknown PRESENT  RBC / HPF Latest Ref Range: 0 - 5 RBC/hpf 6-10  Squamous Epithelial / LPF Latest Ref Range: 0 - 5  0-5  WBC, UA Latest Ref Range: 0 - 5 WBC/hpf 0-5    Unresulted Labs (From admission, onward)     Start     Ordered   02/02/21 0500   Comprehensive metabolic panel  Tomorrow morning,   R       Question:  Specimen collection method  Answer:  Unit=Unit collect   02/01/21 0752   01/30/21 0500  Fungitell, Serum  Tomorrow morning,   R       Question:  Specimen collection method  Answer:  Unit=Unit collect   01/29/21 1706   01/30/21 0500  Toxoplasma Gondii, PCR  Tomorrow morning,   R        01/29/21 1809   01/29/21 1707  Histoplasma antigen, urine  Once,   R        01/29/21 1706   01/28/21 1320  Miscellaneous [facility] test (send-out)  Once,   STAT       Question:  Test name / description:  Answer:  Bupropion and Hydroxybupropion, Serum or Plasma. [facility] [id number]   01/28/21 1320   01/28/21 1318  Miscellaneous [facility] test (send-out)  Once,   STAT       Question:  Test name / description:  Answer:  ToxAssure Flex 23, Urine. [facility] [id number]   01/28/21 1320   01/28/21 1304  Miscellaneous [facility] test (send-out)  Once,   STAT       Question:  Test name / description:  Answer:  Human Herpesvirus 6 (HHV-6), DNA PCR, [facility] test ID [id number]   01/28/21 1304   01/28/21 1300  JC virus, PRC CSF  Once,   STAT       Question:  Release to patient  Answer:  Immediate   01/28/21 1304   01/28/21 1258  HSV 1/2 Ab IgG/IgM CSF  Once,   STAT       Question:  Release to patient  Answer:  Immediate   01/28/21 1304           CMV dna by PCR: Negative CD4: 707 VZV CSF negative  Imaging Reviewed:  CT[facility] wo 01/28/2021: No acute intracranial abnormality. Age-indeterminate left nasal bone fracture.   EEG overnight 01/28/2021 - 01/29/2021: "This study is suggestive of moderate diffuse encephalopathy, nonspecific etiology. No seizures or epileptiform discharges were seen throughout the recording."  Overnight EEG 01/29/2021 - 01/30/2021: This study is suggestive of moderate diffuse encephalopathy, nonspecific etiology. No seizures or epileptiform discharges were seen throughout the recording.  MRI brain wwo showed extensive symmetric T2/FLAIR  hyperintensity in the bilateral supratentorial white matter associated with diffusion restriction btu no contrast enhancement (personal review of actual images and radiology report).    Assessment: 30 yo patient with hx HIV, polysubstance abuse, PTSD, MDD found unresponsive then developed witnessed seizure activity in ED and was intubated for airway protection. He received 40mg /kg Keppra load and sedation with versed. Initial EEG recording showed no electrographic seizures; continuous EEG 01/28/2021 - 01/30/2021 revealed moderate diffuse encephalopathy without seizures or epileptiform discharges. CSF not  c/f infection. ID is considering further workup for LAD ddx incl sarcoidosis.  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 I discussed the imaging findings with Dr. Elinor Parkinson of ID who was in agreement and felt HIV encephalitis less likely particularly in setting of CD4 count >700 and apparent compliance with HAART.   Recommendations: - Continue Keppra 1,000 mg BID for now - Continue acyclovir pending HSV PCR CSF results - Antibiotics per ID recommendations - Per lab, no further CSF for send out for autoimmune encephalitis panel - Serum autoimmune encephalitis panel- [facility] send out - Extended drugs of abuse panel sent to [facility]; pending  - Follow up outstanding CSF studies; initial CSF studies unremarkable - Continue inpatient seizure precautions - LTM EEG d/c'd, follow clinically  Will f/u tmrw  Bing Neighbors, MD [facility] [phone]  If 7pm- 7am, please page neurology on call as listed in AMION.

## 2021-02-01 NOTE — Progress Notes (Signed)
SLP Cancellation Note  Patient Details Name: Howard Rowland MRN: [id number] DOB: 01-20-1991   Cancelled treatment:        Observed pt and checked with RN. He is on low dose Precedex and RN states she is planning to lower at some point. Currently he is not responding to RN adequately for swallow eval. Will follow.   Royce Macadamia 02/01/2021, 10:13 AM Breck Coons Lonell Face.Ed Nurse, children's [phone]; weekend [phone] Office [phone]

## 2021-02-01 NOTE — Progress Notes (Addendum)
OT Cancellation Note  Patient Details Name: Howard Rowland MRN: [id number] DOB: September 06, 1990   Cancelled Treatment:    Reason Eval/Treat Not Completed: Patient's level of consciousness.  Patient with decreased LOA, and unable to effectively follow commands to execute the eval this date safely.  OT to continue efforts as appropriate.    Naasir Carreira D Ciarrah Rae 02/01/2021, 4:00 PM 02/01/2021  RP, OTR/L  Acute Rehabilitation Services  Office:  [phone]

## 2021-02-01 NOTE — Progress Notes (Signed)
NAME:  Howard Rowland, MRN:  [id number], DOB:  September 18, 1990, LOS: 4 ADMISSION DATE:  01/28/2021, CONSULTATION DATE:  8/30 REFERRING MD:  Karene Fry, CHIEF COMPLAINT:  Unresponsive resulting in intubation   History of Present Illness:  30 y/o male with HIV admitted after being found down in a hotel room with presumed narcotic overdose.  Many recent visits to ER with behavioral health involvement regarding depression.   Pertinent  Medical History  MDD, PTSD, Cocaine abuse, HIV  Significant Hospital Events: Including procedures, antibiotic start and stop dates in addition to other pertinent events   8/30 admitted after being found unresponsive 8/31 left pneumothorax 9/02 extubated, off CRRT  Procedures: 8/30 ETT > 9/02 8/30 R IJ HD cath > 9/03 8/30 L Rozel CVL > 8/31 8/30 R femoral arterial line > 9/02 8/31 L IJ CVL >  8/31 L chest tube > 9/03  Studies: 8/30 CT angiogram chest > no PE, bilateral lower lobe infiltrates, considerable bilateral hilar and mediastinal adenopathy 8/31 TTE >LVEF 40-45%, dyskinesis of the left ventricular, basal inferolateral wall, mitral valve regurgitation and thickening, consider TEE, aortic valve with mild sclerosis 9/01 CT chest >> improved aeration at Rt lung base and LLL, decreased adenopathy, small volume Lt PTX with chest tube in place, mild subcutaneous emphysema 9/01 MRI brain >> findings most suggestive of acute toxic leukoencephalopathy  Interim History / Subjective:  Remains on precedex. 2 doses of ativan over night.  Objective   Blood pressure 105/62, pulse 74, temperature 99.1 F (37.3 C), resp. rate 12, height 5\' 8"  (1.727 m), weight 68 kg, SpO2 100 %.        Intake/Output Summary (Last 24 hours) at 02/01/2021 0742 Last data filed at 02/01/2021 0600 Gross per 24 hour  Intake 1788.79 ml  Output 1958 ml  Net -169.21 ml   Filed Weights   01/30/21 0500 01/31/21 0420 02/01/21 0426  Weight: 71.7 kg 68.9 kg 68 kg    Examination:  General -  sedated Eyes - pupils reactive ENT - no sinus tenderness, no stridor Cardiac - regular rate/rhythm, no murmur Chest - equal breath sounds b/l, no wheezing or rales, no air leak from chest tube Abdomen - soft, non tender, + bowel sounds Extremities - no cyanosis, clubbing, or edema Skin - no rashes Neuro - RASS -2  CXR - no pneumothorax   Resolved Hospital Problem list   Septic shock, Acute hypoxic respiratory failure from aspiration pneumonitis, AKI, Hypokalemia, Hypophosphatemia  Assessment & Plan:   Acute metabolic encephalopathy from hypoxia in setting of accidental drug overdose. Seizure after respiratory arrest with hypoxia. Hx of Depression, PTSD. - MRI brain findings more likely related to acute toxic leukoencephalopathy and less likely from HIV encephalopathy - continue keppra per neurology - resume remeron and lyrica when he is able to take pills - wean precedex to keep RASS 0 to -1 - prn ativan/fentanyl - has allergy listed to haldol >> causes dystonia  Lt pneumothorax. - d/c chest tube on 9/03 and f/u CXR  Aspiration pneumonia. Hx of HIV. - day 7 unasyn - continue vancomycin per ID - HAART per ID  Mediastinal adenopathy. - difficult to determine significance of this - has some improvement in very short term follow up CT imaging - might just be reactive - continue current therapies and repeat imaging in several weeks, and then determine if bronchoscopy with EBUS is needed  Mitral valve thickening. - would be reasonable to get TEE, however need to know who we should consent for  this.  In the absence of bacteremia this is not urgent.  Elevated LFTs. - likely from shock and hypoxia - f/u LFTs intermittently  Anemia of critical illness. Mild thrombocytopenia likely related to sepsis. - f/u CBC  Dysphagia. - speech to assess swallowing after extubation - might need cortrak if mental status doesn't improve soon  Deconditioning. - PT/OT   Best Practice  (right click and "Reselect all SmartList Selections" daily)   Diet/type: NPO DVT prophylaxis: prophylactic heparin  GI prophylaxis: N/A and PPI Lines: Central line and yes and it is still needed d/c arterial line today Foley:  Yes, and it is still needed Code Status:  full code Last date of multidisciplinary goals of care discussion [n/a]  Labs    CMP Latest Ref Rng & Units 02/01/2021 01/31/2021 01/30/2021  Glucose 70 - 99 mg/dL 80 829(F) 621(H)  BUN 6 - 20 mg/dL 11 15 15   Creatinine 0.61 - 1.24 mg/dL 0.86 5.78  Sodium 135 - 145 mmol/L 139 139 142  Potassium 3.5 - 5.1 mmol/L 3.8 3.0(L) 3.1(L)  Chloride 98 - 111 mmol/L 105 108 110  CO2 22 - 32 mmol/L 28 23 24   Calcium 8.9 - 10.3 mg/dL 8.0(L) 8.0(L) 7.6(L)  Total Protein 6.5 - 8.1 g/dL - 5.5(L) -  Total Bilirubin 0.3 - 1.2 mg/dL - 1.1 -  Alkaline Phos 38 - 126 U/L - 71 -  AST 15 - 41 U/L - 560(H) -  ALT 0 - 44 U/L - 748(H) -    CBC Latest Ref Rng & Units 02/01/2021 01/31/2021 01/30/2021  WBC 4.0 - 10.5 K/uL 7.4 8.8 11.6(H)  Hemoglobin 13.0 - 17.0 g/dL 10.0(L) 10.1(L) 11.3(L)  Hematocrit 39.0 - 52.0 % 31.6(L) 30.4(L) 33.5(L)  Platelets 150 - 400 K/uL 120(L) 120(L) 138(L)    ABG    Component Value Date/Time   PHART 7.444 01/29/2021 0444   PCO2ART 26.3 (L) 01/29/2021 0444   PO2ART 121 (H) 01/29/2021 0444   HCO3 18.1 (L) 01/29/2021 0444   TCO2 19 (L) 01/29/2021 0444   ACIDBASEDEF 5.0 (H) 01/29/2021 0444   O2SAT 98.4 01/29/2021 1002    CBG (last 3)  Recent Labs    01/31/21 2321 02/01/21 0305 02/01/21 0708  GLUCAP 93 75 79   Critical care time: 32 minutes  04/03/21, MD [place] [facility] Pager - [phone] 02/01/2021, 7:42 AM

## 2021-02-02 ENCOUNTER — Encounter (HOSPITAL_COMMUNITY): Payer: Self-pay | Admitting: Student

## 2021-02-02 DIAGNOSIS — G9341 Metabolic encephalopathy: Secondary | ICD-10-CM | POA: Diagnosis not present

## 2021-02-02 DIAGNOSIS — T402X4S Poisoning by other opioids, undetermined, sequela: Secondary | ICD-10-CM

## 2021-02-02 LAB — CULTURE, BLOOD (ROUTINE X 2)
Culture: NO GROWTH
Culture: NO GROWTH
Special Requests: ADEQUATE

## 2021-02-02 LAB — COMPREHENSIVE METABOLIC PANEL
ALT: 474 U/L — ABNORMAL HIGH (ref 0–44)
AST: 365 U/L — ABNORMAL HIGH (ref 15–41)
Albumin: 2.8 g/dL — ABNORMAL LOW (ref 3.5–5.0)
Alkaline Phosphatase: 105 U/L (ref 38–126)
Anion gap: 12 (ref 5–15)
BUN: 13 mg/dL (ref 6–20)
CO2: 23 mmol/L (ref 22–32)
Calcium: 8.5 mg/dL — ABNORMAL LOW (ref 8.9–10.3)
Chloride: 102 mmol/L (ref 98–111)
Creatinine, Ser: 1.01 mg/dL (ref 0.61–1.24)
GFR, Estimated: >60 mL/min (ref >60)
Glucose, Bld: 91 mg/dL (ref 70–99)
Potassium: 3.7 mmol/L (ref 3.5–5.1)
Sodium: 137 mmol/L (ref 135–145)
Total Bilirubin: 1.6 mg/dL — ABNORMAL HIGH (ref 0.3–1.2)
Total Protein: 5.8 g/dL — ABNORMAL LOW (ref 6.5–8.1)

## 2021-02-02 LAB — GLUCOSE, CAPILLARY
Glucose-Capillary: 73 mg/dL (ref 70–99)
Glucose-Capillary: 70 mg/dL (ref 70–99)
Glucose-Capillary: 97 mg/dL (ref 70–99)
Glucose-Capillary: 100 mg/dL — ABNORMAL HIGH (ref 70–99)

## 2021-02-02 MED ORDER — KCL IN DEXTROSE-NACL 20-5-0.9 MEQ/L-%-% IV SOLN
INTRAVENOUS | Status: DC
  Filled 2021-02-02 (×10): qty 1000

## 2021-02-02 MED ORDER — FENTANYL CITRATE PF 50 MCG/ML IJ SOSY
50.0000 ug | PREFILLED_SYRINGE | INTRAMUSCULAR | Status: DC | PRN
Start: 2021-02-02 — End: 2021-02-08

## 2021-02-02 NOTE — Evaluation (Signed)
Physical Therapy Evaluation Patient Details Name: Howard Rowland MRN: [id number] DOB: 30-Mar-1991 Today's Date: 02/02/2021   History of Present Illness  [name] with PTSD, MDD, HIV BIBEMS on 8/30 after being found unresponsive by bystander. He was severely hypotensive on evaluation by EMS. On ED arrival noted to have seizure activity and he was intubated for airway protection in setting of his encephalopathy.ARF, concern for rhabdo, acute encephalopathy. Extubated 9/2. 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  Clinical Impression   Pt admitted with above diagnosis. Presumed independent as he was living alone in a hotel; Currently he is total A for all basic ADLs supine or sitting, total A +2 for all mobility, intermittently awake, intermittently following commands with increased time and cues, and no balance reaction in sitting; Rec CIR for post-acute rehab;  Pt currently with functional limitations due to the deficits listed below (see PT Problem List). Pt will benefit from skilled PT to increase their independence and safety with mobility to allow discharge to the venue listed below.       Follow Up Recommendations CIR    Equipment Recommendations  Rolling walker with 5" wheels;3in1 (PT);Wheelchair (measurements PT);Wheelchair cushion (measurements PT) (will update as pt progresses)    Recommendations for Other Services Rehab consult     Precautions / Restrictions Precautions Precautions: Fall Precaution Comments: When in sitting when pt starts to raise his head he does not have control and he goes into full c-spine extension Restrictions Weight Bearing Restrictions: No      Mobility  Bed Mobility Overal bed mobility: Needs Assistance Bed Mobility: Supine to Sit;Sit to Supine     Supine to sit: Total assist;+2 for physical assistance Sit to supine: Total assist;+2 for physical assistance   General bed mobility comments: Grimace with getting up     Transfers Overall transfer level: Needs assistance Equipment used: 2 person hand held assist (and use of gait belt and bed pads) Transfers: Sit to/from Stand Sit to Stand: +2 physical assistance;Max assist         General transfer comment: Attempted to stand--pt bore weight on his legs but did not attempt to A to stand; knees blocked for stability  Ambulation/Gait                Stairs            Wheelchair Mobility    Modified Rankin (Stroke Patients Only)       Balance Overall balance assessment: Needs assistance Sitting-balance support: No upper extremity supported;Feet supported Sitting balance-Leahy Scale: Zero Sitting balance - Comments: Pt with zero sitting balance and no righting reaction. Pt with head in flexion and when he raises it he gets to neutral on goes beyond into extension with control                                     Pertinent Vitals/Pain Pain Assessment: Faces Faces Pain Scale: Hurts whole lot Pain Location: right scapula area with movement of right arm and palpation of this area Pain Descriptors / Indicators: Grimacing;Moaning Pain Intervention(s): Monitored during session;Repositioned    Home Living Family/patient expects to be discharged to:: Shelter/Homeless                 Additional Comments: per chart found down in hotel    Prior Function Level of Independence: Independent  Hand Dominance   Dominant Hand: Right    Extremity/Trunk Assessment   Upper Extremity Assessment Upper Extremity Assessment: Defer to OT evaluation RUE Deficits / Details: grimacing with pain when right shoulder moved/palpation of right shoulder blade, moderately edematous, PROM elbow distally WNL within edema LUE Deficits / Details: PROM WNL    Lower Extremity Assessment Lower Extremity Assessment: Generalized weakness (minimal knee and hip extension muscle response with attempt to stand)        Communication   Communication: Expressive difficulties (mumbles)  Cognition Arousal/Alertness:  (varied between awake and lethargic (supine and in sitting)) Behavior During Therapy: Restless;Flat affect (with occassional smiling) Overall Cognitive Status: No family/caregiver present to determine baseline cognitive functioning Area of Impairment: Orientation;Attention;Following commands;Safety/judgement;Awareness;Problem solving                 Orientation Level: Disoriented to;Place;Time;Situation Current Attention Level: Focused   Following Commands: Follows one step commands inconsistently;Follows one step commands with increased time Safety/Judgement: Decreased awareness of safety;Decreased awareness of deficits Awareness: Intellectual Problem Solving: Slow processing;Difficulty sequencing;Decreased initiation;Requires verbal cues;Requires tactile cues General Comments: Knew it was September but year 2020. Pt talking to [place] intermittently but hard to understand him. When given choices between two items (ie banana pudding or mac and cheese) he did choose verbally.      General Comments General comments (skin integrity, edema, etc.): VSS on Room Air    Exercises     Assessment/Plan    PT Assessment Patient needs continued PT services  PT Problem List Decreased strength;Decreased range of motion;Decreased activity tolerance;Decreased balance;Decreased mobility;Decreased coordination;Decreased cognition;Decreased knowledge of use of DME;Decreased safety awareness;Decreased knowledge of precautions;Impaired tone;Pain       PT Treatment Interventions DME instruction;Gait training;Functional mobility training;Therapeutic activities;Therapeutic exercise;Balance training;Neuromuscular re-education;Cognitive remediation;Patient/family education;Wheelchair mobility training    PT Goals (Current goals can be found in the Care Plan section)  Acute Rehab PT Goals Patient Stated Goal:  unable PT Goal Formulation: Patient unable to participate in goal setting Time For Goal Achievement: 02/16/21 Potential to Achieve Goals: Fair    Frequency Min 3X/week   Barriers to discharge   Will need to look inot any social support pt may have    Co-evaluation PT/OT/SLP Co-Evaluation/Treatment: Yes Reason for Co-Treatment: Necessary to address cognition/behavior during functional activity;For patient/therapist safety PT goals addressed during session: Mobility/safety with mobility OT goals addressed during session: Strengthening/ROM;ADL's and self-care       AM-PAC PT "6 Clicks" Mobility  Outcome Measure Help needed turning from your back to your side while in a flat bed without using bedrails?: A Lot Help needed moving from lying on your back to sitting on the side of a flat bed without using bedrails?: Total Help needed moving to and from a bed to a chair (including a wheelchair)?: Total Help needed standing up from a chair using your arms (e.g., wheelchair or bedside chair)?: Total Help needed to walk in hospital room?: Total Help needed climbing 3-5 steps with a railing? : Total 6 Click Score: 7    End of Session Equipment Utilized During Treatment: Gait belt Activity Tolerance: Patient tolerated treatment well Patient left: in bed;with call bell/phone within reach;with bed alarm set;Other (comment) (bed in semi-chair position) Nurse Communication: Mobility status PT Visit Diagnosis: Other abnormalities of gait and mobility (R26.89);Muscle weakness (generalized) (M62.81);Other symptoms and signs involving the nervous system (R29.898);Pain Pain - Right/Left: Right Pain - part of body: Shoulder (and scapula)    Time: 5852-7782 PT Time Calculation (min) (ACUTE ONLY): 41  min   Charges:   PT Evaluation $PT Eval Moderate Complexity: 1 Mod          Roney [place], [place]  Acute Rehabilitation Services Pager [phone] Office [phone]   Colletta [place] 02/02/2021,  4:32 PM

## 2021-02-02 NOTE — Evaluation (Signed)
Occupational Therapy Evaluation Patient Details Name: Howard Rowland MRN: [id number] DOB: [birth year] Today's Date: 02/02/2021    History of Present Illness [name] with PTSD, MDD, HIV BIBEMS on 8/30 after being found unresponsive by bystander. He was severely hypotensive on evaluation by EMS. On ED arrival noted to have seizure activity and he was intubated for airway protection in setting of his encephalopathy.ARF, concern for rhabdo, acute encephalopathy. Extubated 9/2. 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   Clinical Impression   This 30 yo male admitted with above presents to acute OT with PLOF presumed to be independent since he was living at a hotel. Currently he is total A for all basic ADLs supine or sitting, total A +2 for all mobility, intermittently awake, intermittently following commands with increased time and cues, and no balance reaction in sitting. He will continue to benefit from acute OT with follow up on CIR.    Follow Up Recommendations  CIR;Supervision/Assistance - 24 hour    Equipment Recommendations  Other (comment) (TBD next venue)    Recommendations for Other Services Rehab consult     Precautions / Restrictions Precautions Precautions: Fall Precaution Comments: When in sitting when pt starts to raise his head he does not have control and he goes into full extension Restrictions Weight Bearing Restrictions: No      Mobility Bed Mobility Overal bed mobility: Needs Assistance Bed Mobility: Supine to Sit;Sit to Supine     Supine to sit: Total assist;+2 for physical assistance Sit to supine: Total assist;+2 for physical assistance        Transfers                 General transfer comment: Attempted to stand--pt bore weight on his legs but did not attempt to A to stand    Balance Overall balance assessment: Needs assistance Sitting-balance support: No upper extremity supported;Feet supported Sitting  balance-Leahy Scale: Zero Sitting balance - Comments: Pt with zero sitting balance and no righting reaction. Pt with head in flexion and when he raises it he gets to neutral on goes beyong into extension with control                                   ADL either performed or assessed with clinical judgement   ADL                                         General ADL Comments: total A     Vision Baseline Vision/History:  (unknown) Additional Comments: eyes appear dysconjugate, but he reports only seeing 1            Pertinent Vitals/Pain Pain Assessment: Faces Faces Pain Scale: Hurts whole lot Pain Location: right scapula area with movement of right arm and palpation of this area Pain Descriptors / Indicators: Grimacing;Moaning Pain Intervention(s): Limited activity within patient's tolerance;Monitored during session;Repositioned (made MD aware via chat text)     Hand Dominance Right   Extremity/Trunk Assessment Upper Extremity Assessment Upper Extremity Assessment: RUE deficits/detail;LUE deficits/detail RUE Deficits / Details: grimacing with pain when right shoulder moved/palpation of right shoulder blade, moderately edematous, PROM elbow distally WNL within edema LUE Deficits / Details: PROM WNL           Communication Communication Communication: Expressive difficulties (mumbles)  Cognition Arousal/Alertness:  (varied between awake and lethargic (supine and in sitting)) Behavior During Therapy: Restless;Flat affect (with occassional smiling) Overall Cognitive Status: No family/caregiver present to determine baseline cognitive functioning Area of Impairment: Orientation;Attention;Following commands;Safety/judgement;Awareness;Problem solving                 Orientation Level: Disoriented to;Place;Time;Situation Current Attention Level: Focused   Following Commands: Follows one step commands inconsistently;Follows one step commands  with increased time Safety/Judgement: Decreased awareness of safety;Decreased awareness of deficits Awareness: Intellectual Problem Solving: Slow processing;Difficulty sequencing;Decreased initiation;Requires verbal cues;Requires tactile cues General Comments: Knew it was September but year 2020. Pt talking to [place] intermittently but hard to understand him. When given choices between two items (ie banana pudding or mac and cheese) he did choose verbally.              Home Living Family/patient expects to be discharged to:: Shelter/Homeless                                 Additional Comments: per chart found down in hotel      Prior Functioning/Environment Level of Independence: Independent                 OT Problem List: Decreased strength;Decreased range of motion;Decreased activity tolerance;Impaired balance (sitting and/or standing);Impaired vision/perception;Decreased coordination;Decreased cognition;Decreased safety awareness;Decreased knowledge of use of DME or AE;Impaired UE functional use;Increased edema;Pain      OT Treatment/Interventions: Self-care/ADL training;DME and/or AE instruction;Patient/family education;Balance training;Visual/perceptual remediation/compensation;Cognitive remediation/compensation;Therapeutic activities;Therapeutic exercise    OT Goals(Current goals can be found in the care plan section) Acute Rehab OT Goals Patient Stated Goal: unable OT Goal Formulation: Patient unable to participate in goal setting Time For Goal Achievement: 02/16/21 Potential to Achieve Goals: Fair  OT Frequency: Min 2X/week   Barriers to D/C: Decreased caregiver support          Co-evaluation PT/OT/SLP Co-Evaluation/Treatment: Yes Reason for Co-Treatment: Necessary to address cognition/behavior during functional activity;For patient/therapist safety PT goals addressed during session: Mobility/safety with mobility;Balance;Strengthening/ROM OT goals  addressed during session: Strengthening/ROM;ADL's and self-care      AM-PAC OT "6 Clicks" Daily Activity     Outcome Measure Help from another person eating meals?: Total Help from another person taking care of personal grooming?: Total Help from another person toileting, which includes using toliet, bedpan, or urinal?: Total Help from another person bathing (including washing, rinsing, drying)?: Total Help from another person to put on and taking off regular upper body clothing?: Total Help from another person to put on and taking off regular lower body clothing?: Total 6 Click Score: 6   End of Session Equipment Utilized During Treatment: Gait belt Nurse Communication: Mobility status (really only eating banana pudding)  Activity Tolerance: Patient limited by lethargy Patient left: in bed;with call bell/phone within reach;with bed alarm set;with restraints reapplied (x6 (bil ankle, bil wrist, bil mitts))  OT Visit Diagnosis: Unsteadiness on feet (R26.81);Other abnormalities of gait and mobility (R26.89);Muscle weakness (generalized) (M62.81);Other symptoms and signs involving cognitive function;Pain Pain - Right/Left: Right Pain - part of body: Shoulder                Time: 2505-3976 OT Time Calculation (min): 41 min Charges:  OT General Charges $OT Visit: 1 Visit OT Evaluation $OT Eval Moderate Complexity: 1 Mod OT Treatments $Self Care/Home Management : 8-22 mins  [place] Circle, OTR/L Acute [employer] Pager [phone] Office [phone]  Almon Register 02/02/2021, 2:15 PM

## 2021-02-02 NOTE — Treatment Plan (Signed)
Labs reviewed. Cr 1, stable. Can remove HD catheter. Nothing else to add from a nephrology perspective at this time. Discussed with primary service. Please call with any questions/concerns.  Anthony Sar, MD [facility]

## 2021-02-02 NOTE — Progress Notes (Addendum)
Inpatient Rehab Admissions Coordinator:   Per therapy recommendations,  patient was screened for CIR candidacy by Megan Salon, MS, CCC-SLP. At this time, Pt. Appears to demonstrate medical necessity, functional decline, and ability to tolerate intensity of CIR. Pt. is a potential candidate for CIR. I will place   request  for rehab consult per protocol for full assessment. Please contact me any with questions.Megan Salon, MS, CCC-SLP Rehab Admissions Coordinator  [phone] (celll) [phone] (office)

## 2021-02-02 NOTE — Progress Notes (Signed)
Patient ID: Howard Rowland, male   DOB: Oct 24, 1990, 30 y.o.   MRN: [id number] S: still altered. No acute events. Labs pending, uop ~1.3L O:BP (!) 146/102   Pulse 97   Temp 98.9 F (37.2 C) (Oral)   Resp (!) 23   Ht _0  (1.727 m)   Wt 66.7 kg   SpO2 100%   BMI 22.36 kg/m   Intake/Output Summary (Last 24 hours) at 02/02/2021 0806 Last data filed at 02/02/2021 0230 Gross per 24 hour  Intake 647.13 ml  Output 1150 ml  Net -502.87 ml   Intake/Output: I/O last 3 completed shifts: In: 1343.5 [I.V.:146.9; IV Piggyback:1196.6] Out: 6837 [Urine:2350; Chest Tube:55]  Intake/Output this shift:  No intake/output data recorded. Weight change: -1.3 kg Gen: drowsy but awake CVS: rrr Resp: normal wob, o2 sat: 100% on RA Abd: +soft, ND Ext:no edema Neuro: awake but not following commands  Recent Labs  Lab 01/28/21 1102 01/28/21 1125 01/28/21 1324 01/28/21 2244 01/29/21 0444 01/29/21 0458 01/29/21 1631 01/30/21 0321 01/30/21 1556 01/31/21 0515 02/01/21 0516  NA 144   < > 139 141 143 141 138 135 142 139 139  K 7.3*   < > >7.5* 5.3* 4.0 4.4 3.4* 3.2* 3.1* 3.0* 3.8  CL 110   < > 112* 113*  --  101 101 102 110 108 105  CO2 24  --  20* 17*  --  21* _1 GLUCOSE 90   < > 139* 155*  --  199* 124* 156* 126* 116* 80  BUN 12   < > 14 19  --  _2 CREATININE 3.09*   < > 3.08* 2.73*  --  2.26* 1.42* 1.09 0.91 0.89 1.06  ALBUMIN 3.7  --  2.9*  --   --  3.0* 2.9* 3.0* 2.7* 2.9* 2.7*  CALCIUM 7.2*  --  6.4* 6.5*  --  7.3* 7.8* 7.9* 7.6* 8.0* 8.0*  PHOS  --   --  5.1*  --   --  3.8 3.2 1.7* 1.6* 1.2* 3.4  AST 103*  --  129*  --   --   --   --   --   --  560*  --   ALT 117*  --  106*  --   --   --   --   --   --  748*  --    < > = values in this interval not displayed.   Liver Function Tests: Recent Labs  Lab 01/28/21 1102 01/28/21 1324 01/29/21 0458 01/30/21 1556 01/31/21 0515 02/01/21 0516  AST 103* 129*  --   --  560*  --   ALT 117* 106*  --   --  748*  --    ALKPHOS 71 56  --   --  71  --   BILITOT 0.7 0.9  --   --  1.1  --   PROT 6.3* 4.8*  --   --  5.5*  --   ALBUMIN 3.7 2.9*   < > 2.7* 2.9* 2.7*   < > = values in this interval not displayed.   Recent Labs  Lab 01/28/21 1324  LIPASE 22  AMYLASE 537*   Recent Labs  Lab 01/28/21 1116  AMMONIA 75*   CBC: Recent Labs  Lab 01/28/21 1102 01/28/21 1125 01/28/21 1324 01/29/21 0444 01/29/21 0458 01/30/21 0321 01/31/21 0515 02/01/21 0516  WBC 21.7*  --  21.0*  --  16.2* 11.6* 8.8 7.4  NEUTROABS 15.6*  --   --   --   --   --  6.1  --   HGB 13.1   < > 13.6   < > 13.0 11.3* 10.1* 10.0*  HCT 42.6   < > 44.8   < > 39.3 33.5* 30.4* 31.6*  MCV 87.7  --  88.5  --  82.2 80.0 81.3 84.9  PLT 416*  --  250  --  194 138* 120* 120*   < > = values in this interval not displayed.   Cardiac Enzymes: Recent Labs  Lab 01/28/21 1324  CKTOTAL 16,288*   CBG: Recent Labs  Lab 02/01/21 1504 02/01/21 1911 02/01/21 2306 02/02/21 0308 02/02/21 0707  GLUCAP 76 74 70 73 70    Iron Studies: No results for input(s): IRON, TIBC, TRANSFERRIN, FERRITIN in the last 72 hours. Studies/Results: DG Chest Port 1 View  Result Date: 02/01/2021 CLINICAL DATA:  Status post chest tube removal. EXAM: PORTABLE CHEST 1 VIEW COMPARISON:  Chest radiograph February 01, 2021. FINDINGS: Interval removal right IJ central venous catheter and left chest tube. Left IJ central venous catheter tip projects over the superior vena cava, stable. Stable cardiac and mediastinal contours. No consolidative pulmonary opacities. No pleural effusion or pneumothorax. IMPRESSION: Interval removal left chest tube.  No definite pneumothorax. Electronically Signed   By: Lovey Newcomer M.D.   On: 02/01/2021 16:35   DG Chest Port 1 View  Result Date: 02/01/2021 CLINICAL DATA:  Follow-up pneumothorax EXAM: PORTABLE CHEST 1 VIEW COMPARISON:  Chest radiograph from one day prior. FINDINGS: Interval extubation. Stable right and left internal jugular  central venous catheters. Stable pigtail left chest tube. Stable cardiomediastinal silhouette with top-normal heart size. No pneumothorax. No pleural effusion. No pulmonary edema. Mild scarring versus atelectasis at the left costophrenic angle. No acute consolidative airspace disease. IMPRESSION: 1. No pneumothorax. Stable left chest tube. 2. Mild scarring versus atelectasis at the left costophrenic angle. Electronically Signed   By: Ilona Sorrel M.D.   On: 02/01/2021 06:19    chlorhexidine gluconate (MEDLINE KIT)  15 mL Mouth Rinse BID   Chlorhexidine Gluconate Cloth  6 each Topical Daily   cloNIDine  0.2 mg Transdermal Weekly   dolutegravir  50 mg Oral Daily   emtricitabine-tenofovir AF  1 tablet Oral Daily   heparin injection (subcutaneous)  5,000 Units Subcutaneous Q8H   mouth rinse  15 mL Mouth Rinse q12n4p   mirtazapine  45 mg Oral QHS   pregabalin  75 mg Oral BID   sodium chloride flush  10-40 mL Intracatheter Q12H    BMET    Component Value Date/Time   NA 139 02/01/2021 0516   K 3.8 02/01/2021 0516   CL 105 02/01/2021 0516   CO2 28 02/01/2021 0516   GLUCOSE 80 02/01/2021 0516   BUN 11 02/01/2021 0516   CREATININE 1.06 02/01/2021 0516   CALCIUM 8.0 (L) 02/01/2021 0516   GFRNONAA >60 02/01/2021 0516   CBC    Component Value Date/Time   WBC 7.4 02/01/2021 0516   RBC 3.72 (L) 02/01/2021 0516   HGB 10.0 (L) 02/01/2021 0516   HCT 31.6 (L) 02/01/2021 0516   PLT 120 (L) 02/01/2021 0516   MCV 84.9 02/01/2021 0516   MCH 26.9 02/01/2021 0516   MCHC 31.6 02/01/2021 0516   RDW 14.5 02/01/2021 0516   LYMPHSABS 1.9 01/31/2021 0515   MONOABS 0.6 01/31/2021 0515   EOSABS 0.1 01/31/2021 0515   BASOSABS  0.0 01/31/2021 0515    Assessment/Plan:  AKI - presumably due to combination of rhabdomyolysis and ischemic ATN in setting of shock, however toxicology studies are pending.   CRRT started 01/28/21 after RIJ trialysis catheter placed by PCCM. Stopped CRRT 9/2 Labs pending, if kidney  function and lytes are stable then consider removing temp HD line by either today or tomorrow Acute hypoxic respiratory failure with hypercapnia - s/p intubation, management per PCCM-extubated 9/2 Shock - unclear if sepsis vs cardiogenic vs combination - s/p LP to r/o meningitis and started on ampicillin, cefepime, and vancomycin.  off pressors Pulmonary lymphadenopathy - workup per PCCM, possible bronch. Hyperkalemia - due to #1. Improved with CVVHD . K WNL Hypophosphatemia - replete prn Acute metabolic encephalopathy - s/p LP, Neuro consulted.  EEG with nonspecific moderate to severe diffuse encephalopathy.  No seizure activity seen.  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

## 2021-02-02 NOTE — Evaluation (Signed)
Clinical/Bedside Swallow Evaluation Patient Details  Name: Howard Rowland MRN: [id number] Date of Birth: 1991-01-08  Today's Date: 02/02/2021 Time: SLP Start Time (ACUTE ONLY): 1010 SLP Stop Time (ACUTE ONLY): 1020 SLP Time Calculation (min) (ACUTE ONLY): 10 min  Past Medical History: No past medical history on file. Past Surgical History: No past surgical history on file. HPI:  30 y/o male with HIV admitted after being found down in a hotel room with presumed narcotic overdose.  Intubated 8/30-9/2. Had a seizure. MRI shows acute toxic leukoencephalopathy, with  differential including HIV encephalitis. Many recent visits to ER with behavioral health involvement regarding depression.   Assessment / Plan / Recommendation Clinical Impression  Pt lethargic at time of assssment, but with stimulation and cueing pt able to consume cosecutive straw sips and masticate cracker without impairment. Recommended to RN that pt be more alert if being fed meals, but OK to start diet anticipating periods of improved arousal throughout the day. No SLP f/u needed for swallow will sign off. SLP Visit Diagnosis: Dysphagia, unspecified (R13.10)    Aspiration Risk  Mild aspiration risk    Diet Recommendation Regular;Thin liquid   Liquid Administration via: Cup;Straw Medication Administration: Whole meds with liquid Supervision: Staff to assist with self feeding Compensations: Minimize environmental distractions Postural Changes: Seated upright at 90 degrees    Other  Recommendations Oral Care Recommendations: Oral care BID   Follow up Recommendations        Frequency and Duration            Prognosis        Swallow Study   General HPI: 30 y/o male with HIV admitted after being found down in a hotel room with presumed narcotic overdose.  Intubated 8/30-9/2. Had a seizure. MRI shows acute toxic leukoencephalopathy, with  differential including HIV encephalitis. Many recent visits to ER with behavioral  health involvement regarding depression. Type of Study: Bedside Swallow Evaluation Previous Swallow Assessment: none Diet Prior to this Study: NPO Temperature Spikes Noted: No Respiratory Status: Room air History of Recent Intubation: Yes Length of Intubations (days): 4 days Date extubated: 01/31/21 Behavior/Cognition: Lethargic/Drowsy;Requires cueing Oral Cavity Assessment: Within Functional Limits Oral Care Completed by SLP: No Oral Cavity - Dentition: Adequate natural dentition Vision: Impaired for self-feeding Self-Feeding Abilities: Total assist Patient Positioning: Upright in bed Baseline Vocal Quality: Normal Volitional Cough: Cognitively unable to elicit Volitional Swallow: Unable to elicit    Oral/Motor/Sensory Function Overall Oral Motor/Sensory Function: Generalized oral weakness   Ice Chips     Thin Liquid Thin Liquid: Within functional limits Presentation: Straw    Nectar Thick Nectar Thick Liquid: Not tested   Honey Thick Honey Thick Liquid: Not tested   Puree Puree: Within functional limits   Solid     Solid: Within functional limits      Dazaria Macneill, Riley Nearing 02/02/2021,12:11 PM

## 2021-02-02 NOTE — Progress Notes (Addendum)
ID Brief Note  Chart reviewed Afebrile for more than 24 hrs, no leukocytosis, off precedex  Blood cultures 01/28/21 and 01/29/21 No growth to date  TTE with MV thickening concerning for endocarditis   Plan  Continue Vancomycin, pharmacy to dose Can switch Unasyn to ceftriaxone after completing 7 days course of Unasyn Continue Acyclovir pending HSV PCR  Monitor CBC, BMP and Vancomycin trough Will need a TEE to better evaluate the MV thickening concerning for endocarditis and make a decision on further continuation of abtx/stop  Odette Fraction, MD Infectious Disease Physician [facility] [address] [place], [place] [address] Phone: [phone]  Fax: [phone]

## 2021-02-02 NOTE — Progress Notes (Signed)
Neurology Progress Note  Brief HPI: 30 y.o. male with PMHx of polysubstance abuse, PTSD, HIV, and MDD who presented to the ED via EMS after being found unresponsive by a bystander s/p Narcan administration with subsequent altered mental status with obtundation. On arrival to the ED, he was found to be hypoxic on a nonrebreather with labored breathing and coarse breath sounds with a witnessed episode of seizure activity s/p lorazepam administration and subsequent intubation for airway protection. MRI brain wwo showed extensive symmetric T2/FLAIR hyperintensity in the bilateral supratentorial white matter associated with diffusion restriction but no contrast enhancement most c/w acute toxic leukoencephalopathy.  S: Off precedex, received ativan this AM for agitation. Does not follow commands. Protecting airway.  Exam: Vitals:   02/02/21 1300 02/02/21 1400  BP: (!) 156/107 (!) 146/96  Pulse: 95 98  Resp: (!) 25 19  Temp:    SpO2: 100% 100%   Exam MS: awake, intermittently attends to examiner, will not follow simple commands Resp: normal WOB CV: RRR  Neurologic MS: MS: awake, intermittently attends to examiner, will not follow simple commands Speech: no intelligible speech CN: Pupils 68mm ERRL, (+) corneals, EOMI, face symmetric Motor: moves all extremities spontaneously with apparent full strength Sensory: SILT Reflexes: 2+ and symmetric throughout, toes withdraw only bilat  Pertinent Labs: CBC    Component Value Date/Time   WBC 7.4 02/01/2021 0516   RBC 3.72 (L) 02/01/2021 0516   HGB 10.0 (L) 02/01/2021 0516   HCT 31.6 (L) 02/01/2021 0516   PLT 120 (L) 02/01/2021 0516   MCV 84.9 02/01/2021 0516   MCH 26.9 02/01/2021 0516   MCHC 31.6 02/01/2021 0516   RDW 14.5 02/01/2021 0516   LYMPHSABS 1.9 01/31/2021 0515   MONOABS 0.6 01/31/2021 0515   EOSABS 0.1 01/31/2021 0515   BASOSABS 0.0 01/31/2021 0515   CMP     Component Value Date/Time   NA 137 02/02/2021 0633   K 3.7  02/02/2021 0633   CL 102 02/02/2021 0633   CO2 23 02/02/2021 0633   GLUCOSE 91 02/02/2021 0633   BUN 13 02/02/2021 0633   CREATININE 1.01 02/02/2021 0633   CALCIUM 8.5 (L) 02/02/2021 0633   PROT 5.8 (L) 02/02/2021 0633   ALBUMIN 2.8 (L) 02/02/2021 0633   AST 365 (H) 02/02/2021 0633   ALT 474 (H) 02/02/2021 0633   ALKPHOS 105 02/02/2021 0633   BILITOT 1.6 (H) 02/02/2021 0633   GFRNONAA >60 02/02/2021 3086     Ref. Range 01/28/2021 13:04 01/28/2021 13:04  Appearance, CSF Latest Ref Range: CLEAR  CLEAR CLEAR  RBC Count, CSF Latest Ref Range: 0 /cu mm 1 (H) 99 (H)  WBC, CSF Latest Ref Range: 0 - 5 /cu mm 1 1  Other Cells, CSF Unknown TOO FEW TO COUNT, SMEAR AVAILABLE FOR REVIEW TOO FEW TO COUNT, SMEAR AVAILABLE FOR REVIEW  Color, CSF Latest Ref Range: COLORLESS  COLORLESS COLORLESS  Supernatant Unknown NOT INDICATED NOT INDICATED  Tube # Unknown 4 1   Drugs of Abuse     Component Value Date/Time   LABOPIA POSITIVE (A) 01/28/2021 1215   COCAINSCRNUR NONE DETECTED 01/28/2021 1215   LABBENZ NONE DETECTED 01/28/2021 1215   AMPHETMU NONE DETECTED 01/28/2021 1215   THCU NONE DETECTED 01/28/2021 1215   LABBARB NONE DETECTED 01/28/2021 1215    Urinalysis    Component Value Date/Time   COLORURINE AMBER (A) 01/28/2021 1215   APPEARANCEUR CLOUDY (A) 01/28/2021 1215   LABSPEC 1.017 01/28/2021 1215   PHURINE 5.0 01/28/2021 1215  GLUCOSEU NEGATIVE 01/28/2021 1215   HGBUR LARGE (A) 01/28/2021 1215   BILIRUBINUR NEGATIVE 01/28/2021 1215   KETONESUR NEGATIVE 01/28/2021 1215   PROTEINUR 30 (A) 01/28/2021 1215   NITRITE NEGATIVE 01/28/2021 1215   LEUKOCYTESUR NEGATIVE 01/28/2021 1215     Ref. Range 01/28/2021 12:15  Bacteria, UA Latest Ref Range: NONE SEEN  FEW (A)  Hyaline Casts, UA Unknown PRESENT  RBC / HPF Latest Ref Range: 0 - 5 RBC/hpf 6-10  Squamous Epithelial / LPF Latest Ref Range: 0 - 5  0-5  WBC, UA Latest Ref Range: 0 - 5 WBC/hpf 0-5    Unresulted Labs (From admission,  onward)     Start     Ordered   02/03/21 0500  Comprehensive metabolic panel  Tomorrow morning,   R       Question:  Specimen collection method  Answer:  Lab=Lab collect   02/02/21 0938   02/03/21 0500  CBC  Tomorrow morning,   R       Question:  Specimen collection method  Answer:  Lab=Lab collect   02/02/21 0938   02/02/21 1430  Vancomycin, trough  Once-Timed,   TIMED       Comments: Hold vancomycin dose due at 1500 until AFTER vancomycin trough has been drawn. Thanks!   Question:  Specimen collection method  Answer:  Lab=Lab collect   02/02/21 1055   01/30/21 0500  Fungitell, Serum  Tomorrow morning,   R       Question:  Specimen collection method  Answer:  Unit=Unit collect   01/29/21 1706   01/30/21 0500  Toxoplasma Gondii, PCR  Tomorrow morning,   R        01/29/21 1809   01/29/21 1707  Histoplasma antigen, urine  Once,   R        01/29/21 1706   01/28/21 1320  Miscellaneous [facility] test (send-out)  Once,   STAT       Question:  Test name / description:  Answer:  Bupropion and Hydroxybupropion, Serum or Plasma. [facility] [id number]   01/28/21 1320   01/28/21 1318  Miscellaneous [facility] test (send-out)  Once,   STAT       Question:  Test name / description:  Answer:  ToxAssure Flex 23, Urine. [facility] [id number]   01/28/21 1320   01/28/21 1304  Miscellaneous [facility] test (send-out)  Once,   STAT       Question:  Test name / description:  Answer:  Human Herpesvirus 6 (HHV-6), DNA PCR, [facility] test ID [id number]   01/28/21 1304   01/28/21 1300  JC virus, PRC CSF  Once,   STAT       Question:  Release to patient  Answer:  Immediate   01/28/21 1304   01/28/21 1258  HSV 1/2 Ab IgG/IgM CSF  Once,   STAT       Question:  Release to patient  Answer:  Immediate   01/28/21 1304           CMV dna by PCR: Negative CD4: 707 VZV CSF negative  Imaging Reviewed:  CT[facility] wo 01/28/2021: No acute intracranial abnormality. Age-indeterminate left nasal bone fracture.   EEG overnight 01/28/2021 -  01/29/2021: "This study is suggestive of moderate diffuse encephalopathy, nonspecific etiology. No seizures or epileptiform discharges were seen throughout the recording."  Overnight EEG 01/29/2021 - 01/30/2021: This study is suggestive of moderate diffuse encephalopathy, nonspecific etiology. No seizures or epileptiform discharges were seen throughout the recording.  MRI brain wwo showed  extensive symmetric T2/FLAIR hyperintensity in the bilateral supratentorial white matter associated with diffusion restriction but no contrast enhancement (personal review of actual images and radiology report).    Assessment: 30 yo patient with hx HIV, polysubstance abuse, PTSD, MDD found unresponsive then developed witnessed seizure activity in ED and was intubated for airway protection. He received 40mg /kg Keppra load and sedation with versed. Initial EEG recording showed no electrographic seizures; continuous EEG 01/28/2021 - 01/30/2021 revealed moderate diffuse encephalopathy without seizures or epileptiform discharges. CSF not c/f infection. ID is considering further workup for LAD ddx incl sarcoidosis.  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 I discussed the imaging findings with Dr. 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   Recommendations: - Continue Keppra 1,000 mg BID for now - Continue acyclovir pending HSV PCR CSF results - Antibiotics per ID recommendations - Follow up outstanding CSF studies; initial CSF studies unremarkable - Primary team to f/u serum paraneoplastic panel sent out to [facility] clinic on 9.1 - Continue inpatient seizure precautions  This is a relatively rare toxic syndrome and prognosis is poorly understood. If he does improve with respect to mental status, the recovery is expected to be slow. He will need extensive rehab  PT/OT/SLP if he gets to the point where he is able to participate. We will be available on a prn basis for questions going forward.   [facility], MD [facility] [phone]  If 7pm- 7am, please page neurology on call as listed in AMION.

## 2021-02-02 NOTE — Progress Notes (Signed)
NAME:  Howard Rowland, MRN:  [id number], DOB:  30-Sep-1990, LOS: 5 ADMISSION DATE:  01/28/2021, CONSULTATION DATE:  8/30 REFERRING MD:  Karene Fry, CHIEF COMPLAINT:  Unresponsive resulting in intubation   History of Present Illness:  30 y/o male with HIV admitted after being found down in a hotel room with presumed narcotic overdose.  Many recent visits to ER with behavioral health involvement regarding depression.   Pertinent  Medical History  MDD, PTSD, Cocaine abuse, HIV  Significant Hospital Events: Including procedures, antibiotic start and stop dates in addition to other pertinent events   8/30 admitted after being found unresponsive 8/31 left pneumothorax 9/02 extubated, off CRRT 9/03 Lt chest tubed and CVL removed 9/04 transfer to progressive care  Procedures: 8/30 ETT > 9/02 8/30 R IJ HD cath > 9/03 8/30 L [place] CVL > 8/31 8/30 R femoral arterial line > 9/02 8/31 L IJ CVL > 9/3 8/31 L chest tube > 9/03  Studies: 8/30 CT angiogram chest > no PE, bilateral lower lobe infiltrates, considerable bilateral hilar and mediastinal adenopathy 8/31 TTE >LVEF 40-45%, dyskinesis of the left ventricular, basal inferolateral wall, mitral valve regurgitation and thickening, consider TEE, aortic valve with mild sclerosis 9/01 CT chest >> improved aeration at Rt lung base and LLL, decreased adenopathy, small volume Lt PTX with chest tube in place, mild subcutaneous emphysema 9/01 MRI brain >> findings most suggestive of acute toxic leukoencephalopathy  Interim History / Subjective:  Off precedex.  Received ativan this morning.  Objective   Blood pressure (!) 143/101, pulse 96, temperature 99.2 F (37.3 C), temperature source Oral, resp. rate (!) 24, height 5\' 8"  (1.727 m), weight 66.7 kg, SpO2 100 %.        Intake/Output Summary (Last 24 hours) at 02/02/2021 04/04/2021 Last data filed at 02/02/2021 0800 Gross per 24 hour  Intake 923.64 ml  Output 1150 ml  Net -226.36 ml   Filed Weights    01/31/21 0420 02/01/21 0426 02/02/21 0500  Weight: 68.9 kg 68 kg 66.7 kg    Examination:  General - somnolent Eyes - pupils reactive ENT - no sinus tenderness, no stridor Cardiac - regular rate/rhythm, no murmur Chest - equal breath sounds b/l, no wheezing or rales Abdomen - soft, non tender, + bowel sounds Extremities - no cyanosis, clubbing, or edema Skin - no rashes Neuro - able to state his name   Resolved Hospital Problem list   Septic shock, Acute hypoxic respiratory failure from aspiration pneumonitis, AKI, Hypokalemia, Hypophosphatemia, Lt pneumothorax  Assessment & Plan:   Acute metabolic encephalopathy from hypoxia in setting of accidental drug overdose. Seizure after respiratory arrest with hypoxia. Hx of Depression, PTSD. - MRI brain findings more likely related to acute toxic leukoencephalopathy most likely from opiate abuse and less likely from HIV encephalopathy - continue keppra per neurology - resume remeron and lyrica when he is able to take pills - prn ativan, fentanyl - continue acyclovir per ID until CSF HSV results finalized from 8/30 - has allergy listed to haldol >> causes dystonia  Aspiration pneumonia. Hx of HIV. - day 8 of unasyn - continue vancomycin per ID - f/u blood cultures from 8/31 - HAART per ID  Mediastinal adenopathy. - difficult to determine significance of this - has some improvement in very short term follow up CT imaging - might just be reactive - continue current therapies and repeat imaging in several weeks, and then determine if bronchoscopy with EBUS is needed  Mitral valve thickening. - uncertain significance -  might need TEE at some point; once more stable mental status can reassess  Elevated LFTs. - likely from shock and hypoxia - improving - f/u LFTs intermittently  Anemia of critical illness. Mild thrombocytopenia likely related to sepsis. - f/u CBC intermittently  Dysphagia. - speech to assess swallowing -  continue IV fluids D5 NS at 50 ml/hr for now  Deconditioning. - PT/OT  Transfer to progressive care 9/04 >> will ask [facility] to assume care from 9/05 and PCCM will f/u on 9/06 to assess whether he needs bronchoscopy at some point  Best Practice (right click and "Reselect all SmartList Selections" daily)   Diet/type: NPO DVT prophylaxis: prophylactic heparin  GI prophylaxis: N/A and PPI Lines: Central line and yes and it is still needed d/c arterial line today Foley:  Yes, and it is still needed Code Status:  full code Last date of multidisciplinary goals of care discussion [n/a]  Labs    CMP Latest Ref Rng & Units 02/02/2021 02/01/2021 01/31/2021  Glucose 70 - 99 mg/dL 91 80 633(H)  BUN 6 - 20 mg/dL 13 11 15   Creatinine 0.61 - 1.24 mg/dL 5.45 6.25  Sodium 135 - 145 mmol/L 137 139 139  Potassium 3.5 - 5.1 mmol/L 3.7 3.8 3.0(L)  Chloride 98 - 111 mmol/L 102 105 108  CO2 22 - 32 mmol/L 23 28 23   Calcium 8.9 - 10.3 mg/dL 6.38) ) 9.3(T)  Total Protein 6.5 - 8.1 g/dL 3.4(K) - 5.5(L)  Total Bilirubin 0.3 - 1.2 mg/dL 8.7(G) - 1.1  Alkaline Phos 38 - 126 U/L 105 - 71  AST 15 - 41 U/L 365(H) - 560(H)  ALT 0 - 44 U/L 474(H) - 748(H)    CBC Latest Ref Rng & Units 02/01/2021 01/31/2021 01/30/2021  WBC 4.0 - 10.5 K/uL 7.4 8.8 11.6(H)  Hemoglobin 13.0 - 17.0 g/dL 10.0(L) 10.1(L) 11.3(L)  Hematocrit 39.0 - 52.0 % 31.6(L) 30.4(L) 33.5(L)  Platelets 150 - 400 K/uL 120(L) 120(L) 138(L)    ABG    Component Value Date/Time   PHART 7.444 01/29/2021 0444   PCO2ART 26.3 (L) 01/29/2021 0444   PO2ART 121 (H) 01/29/2021 0444   HCO3 18.1 (L) 01/29/2021 0444   TCO2 19 (L) 01/29/2021 0444   ACIDBASEDEF 5.0 (H) 01/29/2021 0444   O2SAT 98.4 01/29/2021 1002    CBG (last 3)  Recent Labs    02/01/21 2306 02/02/21 0308 02/02/21 0707  GLUCAP 70 73 70   Signature:  04/04/21, MD [facility] Pulmonary/Critical Care Pager - [phone] 02/02/2021, 9:27 AM

## 2021-02-03 DIAGNOSIS — J690 Pneumonitis due to inhalation of food and vomit: Secondary | ICD-10-CM

## 2021-02-03 DIAGNOSIS — B20 Human immunodeficiency virus [HIV] disease: Secondary | ICD-10-CM

## 2021-02-03 LAB — COMPREHENSIVE METABOLIC PANEL
ALT: 359 U/L — ABNORMAL HIGH (ref 0–44)
AST: 223 U/L — ABNORMAL HIGH (ref 15–41)
Albumin: 3 g/dL — ABNORMAL LOW (ref 3.5–5.0)
Alkaline Phosphatase: 93 U/L (ref 38–126)
Anion gap: 11 (ref 5–15)
BUN: 13 mg/dL (ref 6–20)
CO2: 21 mmol/L — ABNORMAL LOW (ref 22–32)
Calcium: 8.5 mg/dL — ABNORMAL LOW (ref 8.9–10.3)
Chloride: 105 mmol/L (ref 98–111)
Creatinine, Ser: 0.98 mg/dL (ref 0.61–1.24)
GFR, Estimated: >60 mL/min (ref >60)
Glucose, Bld: 98 mg/dL (ref 70–99)
Potassium: 4 mmol/L (ref 3.5–5.1)
Sodium: 137 mmol/L (ref 135–145)
Total Bilirubin: 1.4 mg/dL — ABNORMAL HIGH (ref 0.3–1.2)
Total Protein: 6.2 g/dL — ABNORMAL LOW (ref 6.5–8.1)

## 2021-02-03 LAB — CULTURE, BLOOD (ROUTINE X 2)
Culture: NO GROWTH
Culture: NO GROWTH
Special Requests: ADEQUATE
Special Requests: ADEQUATE

## 2021-02-03 LAB — CBC
HCT: 33.7 % — ABNORMAL LOW (ref 39.0–52.0)
Hemoglobin: 11.2 g/dL — ABNORMAL LOW (ref 13.0–17.0)
MCH: 26.9 pg (ref 26.0–34.0)
MCHC: 33.2 g/dL (ref 30.0–36.0)
MCV: 81 fL (ref 80.0–100.0)
Platelets: 175 10*3/uL (ref 150–400)
RBC: 4.16 MIL/uL — ABNORMAL LOW (ref 4.22–5.81)
RDW: 13.6 % (ref 11.5–15.5)
WBC: 9.2 10*3/uL (ref 4.0–10.5)
nRBC: 0 % (ref 0.0–0.2)

## 2021-02-03 LAB — HSV 1/2 AB IGG/IGM CSF
HSV 1/2 Ab Screen IgG, CSF: 0.52 IV (ref <=0.89)
HSV 1/2 Ab, IgM, CSF: 0.11 IV (ref <=0.89)

## 2021-02-03 LAB — VANCOMYCIN, TROUGH: Vancomycin Tr: 4 ug/mL — ABNORMAL LOW (ref 15–20)

## 2021-02-03 MED ORDER — VANCOMYCIN HCL 500 MG/100ML IV SOLN
500.0000 mg | Freq: Three times a day (TID) | INTRAVENOUS | Status: DC
  Filled 2021-02-03: qty 100

## 2021-02-03 MED ORDER — VANCOMYCIN HCL 750 MG/150ML IV SOLN
750.0000 mg | Freq: Three times a day (TID) | INTRAVENOUS | Status: DC
  Administered 2021-02-03 – 2021-02-04 (×3): 750 mg via INTRAVENOUS
  Filled 2021-02-03 (×4): qty 150

## 2021-02-03 MED ORDER — TAMSULOSIN HCL 0.4 MG PO CAPS
0.4000 mg | ORAL_CAPSULE | Freq: Every day | ORAL | Status: DC
  Administered 2021-02-03 – 2021-02-08 (×6): 0.4 mg via ORAL
  Filled 2021-02-03 (×6): qty 1

## 2021-02-03 MED ORDER — DEXTROSE 5 % IV SOLN
700.0000 mg | Freq: Three times a day (TID) | INTRAVENOUS | Status: DC
  Administered 2021-02-03 – 2021-02-04 (×4): 700 mg via INTRAVENOUS
  Filled 2021-02-03 (×6): qty 14

## 2021-02-03 NOTE — Progress Notes (Signed)
Pt unable to void, bladder scan >400. [facility] RN called and notified of pt unable to void, still confused refusing meds and temp 100.5   Order received for I&O cath. However, pt able to 150 in urinal then later incontinent of more urine. (Condom cath applied but came off). Post void bladder scan = 500 so I&O cath done with sterile technique with Marchelle Folks charge RN as 2nd Charity fundraiser. Pt tolerated procedure surprisingly well considering his confusion and was not agitated during procedure.

## 2021-02-03 NOTE — Progress Notes (Signed)
Pharmacy Antimicrobial Note  Howard Rowland is a 30 y.o. male admitted on 01/28/2021 with  concern for endocarditis .  Pharmacy has been consulted for vancomycin, acyclovir, and Unasyn dosing.  Pt had been on CRRT, now AKI has resolved; vanc dose was increased but trough well below goal at 4; will change to AUC dosing.  Plan: Change vancomycin to 750mg  IV Q8H. Goal AUC 400-550.  Expected AUC 470.  SCr 0.98.  Change acyclovir to 700mg  IV Q8H. Continue Unasyn 3g IV Q6H.   Height: 5\' 8"  (172.7 cm) Weight: 70.8 kg (156 lb 1.4 oz) IBW/kg (Calculated) : 68.4  Temp (24hrs), Avg:99.5 F (37.5 C), Min:97.8 F (36.6 C), Max:100.5 F (38.1 C)  Recent Labs  Lab 01/28/21 1333 01/28/21 2049 01/28/21 2244 01/29/21 0458 01/29/21 1002 01/29/21 1631 01/30/21 0321 01/30/21 1556 01/30/21 2133 01/31/21 0515 01/31/21 1206 02/01/21 0516 02/02/21 0633 02/03/21 0345  WBC  --   --   --  16.2*  --   --  11.6*  --   --  8.8  --  7.4  --  9.2  CREATININE  --   --    < > 2.26*  --    < > 1.09 0.91  --  0.89  --  1.06 1.01 0.98  LATICACIDVEN 4.9* 4.1*  --   --  4.8*  --   --   --  1.1  --   --   --   --   --   VANCOTROUGH  --   --   --   --   --   --   --   --   --   --   --   --   --  4*  VANCORANDOM  --   --   --   --   --   --   --   --   --   --  <4  --   --   --    < > = values in this interval not displayed.    Estimated Creatinine Clearance: 108.6 mL/min (by C-G formula based on SCr of 0.98 mg/dL).    Allergies  Allergen Reactions   Gabapentin Other (See Comments)    Stomach cramps   Haldol [Haloperidol] Other (See Comments)    Dystonia   Naloxone Swelling and Other (See Comments)    Tongue swelling   Prednisone Swelling and Other (See Comments)    Pharyngeal swelling   Suboxone [Buprenorphine Hcl-Naloxone Hcl] Swelling and Other (See Comments)    Tongue swelling   Lurasidone Anxiety   Ziprasidone Hives    Thank you for allowing pharmacy to be a part of this patient's  care.  04/03/21, PharmD, BCPS  02/03/2021 4:45 AM

## 2021-02-03 NOTE — Progress Notes (Signed)
Inpatient Rehabilitation Admissions Coordinator   Inpatient rehab consult received. Met with patient at bedside in 4 point restraints with sitter at bedside. Restless and confused. Noted patient listed as homeless and no NOK identified. Patient not at  level to fully participate in a CIR admit at this time, no NOK listed and found down at a hotel. If caregivers supports identified and patient more participatory, CIR can be reconsulted. We will sign off at this time. Acute team and TOC made aware.   , RN, MSN Rehab Admissions Coordinator [phone] 02/03/2021 1:31 PM  

## 2021-02-03 NOTE — Progress Notes (Signed)
Called by RN that pt wife at bedside and wants to speak with MD.  Micah Flesher to speak to patient and wife. Reviewed chart. Attending note states that is unclear if legally married.  Both state they are married.  Request transfer to [facility]. Pt states he doesn't like [facility] and wants to be moved. Does not give specifics of what issues of concern are.  [facility] PALS line about transfer. There are no beds available and due to extreme capacity at [facility] currently they cannot accept at this time.  I notified RN

## 2021-02-03 NOTE — Progress Notes (Addendum)
[facility] PROGRESS NOTE   Howard Rowland YNX:[id number] DOB: 29-Jun-1990 DOA: 01/28/2021  PCP: Pcp, No  Brief History/Interval Summary: 30 y/o male with HIV admitted after being found down in a hotel room with presumed narcotic overdose.  Many recent visits to ER with behavioral health involvement regarding depression.   Significant events: 8/30 admitted after being found unresponsive 8/31 left pneumothorax 9/02 extubated, off CRRT 9/03 Lt chest tubed and CVL removed 9/04 transfer to progressive care   Procedures: 8/30 ETT > 9/02 8/30 R IJ HD cath > 9/03 8/30 L Smiley CVL > 8/31 8/30 R femoral arterial line > 9/02 8/31 L IJ CVL > 9/3 8/31 L chest tube > 9/03   Studies: 8/30 CT angiogram chest > no PE, bilateral lower lobe infiltrates, considerable bilateral hilar and mediastinal adenopathy 8/31 TTE >LVEF 40-45%, dyskinesis of the left ventricular, basal inferolateral wall, mitral valve regurgitation and thickening, consider TEE, aortic valve with mild sclerosis 9/01 CT chest >> improved aeration at Rt lung base and LLL, decreased adenopathy, small volume Lt PTX with chest tube in place, mild subcutaneous emphysema 9/01 MRI brain >> 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  No seizure or epileptiform activity noted.   Consultants: Nephrology, neurology, infectious disease   Antibiotics: Anti-infectives (From admission, onward)    Start     Dose/Rate Route Frequency Ordered Stop   02/03/21 0800  vancomycin (VANCOREADY) IVPB 500 mg/100 mL  Status:  Discontinued        500 mg 100 mL/hr over 60 Minutes Intravenous Every 8 hours 02/03/21 0444 02/03/21 0444   02/03/21 0800  vancomycin (VANCOREADY) IVPB 750 mg/150 mL        750 mg 150 mL/hr over 60 Minutes Intravenous Every 8 hours 02/03/21 0444     02/03/21 0630  acyclovir (ZOVIRAX) 700 mg in dextrose 5 % 100 mL IVPB        700 mg 114 mL/hr over 60  Minutes Intravenous Every 8 hours 02/03/21 0458     01/31/21 1600  Ampicillin-Sulbactam (UNASYN) 3 g in sodium chloride 0.9 % 100 mL IVPB  Status:  Discontinued        3 g 200 mL/hr over 30 Minutes Intravenous Every 8 hours 01/31/21 0918 01/31/21 1208   01/31/21 1500  vancomycin (VANCOREADY) IVPB 500 mg/100 mL  Status:  Discontinued        500 mg 100 mL/hr over 60 Minutes Intravenous Every 12 hours 01/31/21 1410 02/03/21 0443   01/31/21 1300  Ampicillin-Sulbactam (UNASYN) 3 g in sodium chloride 0.9 % 100 mL IVPB        3 g 200 mL/hr over 30 Minutes Intravenous Every 6 hours 01/31/21 1208 02/05/21 0559   01/31/21 1000  dolutegravir (TIVICAY) tablet 50 mg        50 mg Oral Daily 01/31/21 0804     01/31/21 1000  emtricitabine-tenofovir AF (DESCOVY) 200-25 MG per tablet 1 tablet        1 tablet Oral Daily 01/31/21 0804     01/30/21 1845  dolutegravir (TIVICAY) tablet 50 mg  Status:  Discontinued        50 mg Per Tube Daily 01/30/21 1748 01/31/21 0804   01/30/21 1845  emtricitabine-tenofovir AF (DESCOVY) 200-25 MG per tablet 1 tablet  Status:  Discontinued        1 tablet Per Tube Daily 01/30/21 1748 01/31/21 0804   01/29/21 1800  acyclovir (ZOVIRAX) 670 mg in dextrose 5 %  100 mL IVPB  Status:  Discontinued        10 mg/kg  67.1 kg 113.4 mL/hr over 60 Minutes Intravenous Every 24 hours 01/28/21 2150 02/03/21 0457   01/29/21 1800  vancomycin (VANCOREADY) IVPB 750 mg/150 mL  Status:  Discontinued        750 mg 150 mL/hr over 60 Minutes Intravenous Every 24 hours 01/28/21 2150 01/31/21 1410   01/29/21 1600  Ampicillin-Sulbactam (UNASYN) 3 g in sodium chloride 0.9 % 100 mL IVPB  Status:  Discontinued        3 g 200 mL/hr over 30 Minutes Intravenous Every 8 hours 01/29/21 1443 01/31/21 0918   01/29/21 1545  dolutegravir (TIVICAY) tablet 50 mg  Status:  Discontinued        50 mg Per Tube Daily 01/29/21 1445 01/30/21 0720   01/29/21 1545  emtricitabine-tenofovir AF (DESCOVY) 200-25 MG per tablet 1  tablet  Status:  Discontinued        1 tablet Per Tube Daily 01/29/21 1445 01/30/21 0720   01/29/21 0300  ceFEPIme (MAXIPIME) 2 g in sodium chloride 0.9 % 100 mL IVPB  Status:  Discontinued        2 g 200 mL/hr over 30 Minutes Intravenous Every 12 hours 01/28/21 1427 01/28/21 1432   01/28/21 2000  ceFEPIme (MAXIPIME) 2 g in sodium chloride 0.9 % 100 mL IVPB  Status:  Discontinued        2 g 200 mL/hr over 30 Minutes Intravenous Every 12 hours 01/28/21 1749 01/29/21 1407   01/28/21 1830  vancomycin (VANCOREADY) IVPB 1500 mg/300 mL        1,500 mg 150 mL/hr over 120 Minutes Intravenous  Once 01/28/21 1732 01/28/21 2022   01/28/21 1500  ceFEPIme (MAXIPIME) 2 g in sodium chloride 0.9 % 100 mL IVPB  Status:  Discontinued        2 g 200 mL/hr over 30 Minutes Intravenous Every 12 hours 01/28/21 1432 01/28/21 1749   01/28/21 1422  vancomycin variable dose per unstable renal function (pharmacist dosing)  Status:  Discontinued         Does not apply See admin instructions 01/28/21 1422 01/28/21 1732   01/28/21 1400  acyclovir (ZOVIRAX) 670 mg in dextrose 5 % 100 mL IVPB  Status:  Discontinued        10 mg/kg  67.1 kg 113.4 mL/hr over 60 Minutes Intravenous Every 12 hours 01/28/21 1338 01/28/21 2150   01/28/21 1300  ceFEPIme (MAXIPIME) 2 g in sodium chloride 0.9 % 100 mL IVPB  Status:  Discontinued        2 g 200 mL/hr over 30 Minutes Intravenous  Once 01/28/21 1253 01/28/21 1431   01/28/21 1300  ampicillin (OMNIPEN) 1 g in sodium chloride 0.9 % 100 mL IVPB  Status:  Discontinued        1 g 300 mL/hr over 20 Minutes Intravenous  Once 01/28/21 1255 01/28/21 1423   01/28/21 1200  cefTRIAXone (ROCEPHIN) 2 g in sodium chloride 0.9 % 100 mL IVPB  Status:  Discontinued        2 g 200 mL/hr over 30 Minutes Intravenous  Once 01/28/21 1158 01/28/21 1253   01/28/21 1200  vancomycin (VANCOREADY) IVPB 1500 mg/300 mL  Status:  Discontinued        1,500 mg 150 mL/hr over 120 Minutes Intravenous  Once 01/28/21  1158 01/28/21 1732       Subjective/Interval History: Patient remains confused.  Does not answer all questions appropriately.  When asked about pain he does say no.  He is noted to be restrained.  Sitter is at the bedside.  Occasionally he would start fidgeting around moving his legs and arms.  History is limited.     Assessment/Plan:  Acute metabolic encephalopathy from hypoxia in the setting of accidental drug overdose Patient was seen by neurology.  Underwent MRI and EEG.  Patient apparently had seizure activity after respiratory arrest.  EEG has not shown any epileptiform activity.  Patient however remains on Keppra. Apparently serum paraneoplastic panel was sent out to [facility] on 9/1.  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  Underwent a lumbar puncture due to his acute confusion.  Was placed on broad-spectrum antibiotics.  Currently he is noted to be on vancomycin and Unasyn.  Also noted to be on acyclovir.  CSF HSV PCR is still pending. Blood cultures were negative however transthoracic echocardiogram raise concern for mitral valve thickening.  Discussed with infectious disease who would prefer to have a TEE to determine plan regarding antibiotics.  Patient remains encephalopathic though it appears that he has been making some improvement.  We will wait for his mentation improves some more before pursuing TEE.  We will also need someone to give consent for this procedure.  Will reevaluate tomorrow.  Aspiration pneumonia in the setting of HIV Unasyn was initiated on August 31.  ID has recommended switching to ceftriaxone after 7 days which will be on September 7.  Battery status appears to be stable.  No longer requiring oxygen.  Acute respiratory failure with hypoxia/pneumothorax Developed left-sided pneumothorax after central line was placed.  Had a chest tube In ICU.  This has  been removed.  Respiratory status noted to be stable.  History of depression/PTSD Apparently on Remeron and Lyrica prior to admission.  Resumed.  Mediastinal adenopathy Significance of this is unclear.  Might be reactive per pulmonology.  They recommend repeat imaging in a few weeks and then to determine if bronchoscopy with EBUS is needed.  History of HIV Remains on antiretroviral treatment.  Infectious disease has been following.  CD4 was 707 on 8/30.  Abnormal LFTs Thought to be secondary to shock as well as acute illness.  LFTs improving gradually.  Continue to check periodically.  Acute kidney injury, resolved Thought to be secondary to rhabdomyolysis and ATN in the setting of shock.  Patient was on CRRT in the ICU.  A dialysis catheter was placed.  Nephrology was following.  Urine output was monitored.  Renal function has improved.  Dialysis was discontinued.  Dialysis catheter was removed.  Nephrology has signed off. Creatinine is noted to be 0.98 today.  Monitor urine output.  Intermittent urinary retention Overnight patient was noted to be retaining urine.  Required catheterization.  Bladder scans periodically.  Flomax.  Anemia of critical illness/mild thrombocytopenia Platelet counts appear to have improved.  Hemoglobin noted to be stable.  Dysphagia Seen by speech therapy.  Cleared for regular diet.  Deconditioning PT and OT evaluation.  CIR recommended.   DVT Prophylaxis: Subcutaneous heparin Code Status: Full code Family Communication: No family at bedside.  Patient apparently has a significant other by the name of Saranap.  It is not clear if patient and this individual are legally married.  Marriage certificate was requested.  Critical care medicine apparently has been in touch with patient's father as well. Disposition Plan: Unclear for now  Status is:  Inpatient  Remains inpatient appropriate because:Altered mental status, IV treatments appropriate due to  intensity of illness or inability to take PO, and Inpatient level of care appropriate due to severity of illness  Dispo: The patient is from: Home              Anticipated d/c is to:  To be determined              Patient currently is not medically stable to d/c.   Difficult to place patient No       Medications: Scheduled:  chlorhexidine gluconate (MEDLINE KIT)  15 mL Mouth Rinse BID   Chlorhexidine Gluconate Cloth  6 each Topical Daily   cloNIDine  0.2 mg Transdermal Weekly   dolutegravir  50 mg Oral Daily   emtricitabine-tenofovir AF  1 tablet Oral Daily   heparin injection (subcutaneous)  5,000 Units Subcutaneous Q8H   mouth rinse  15 mL Mouth Rinse q12n4p   mirtazapine  45 mg Oral QHS   pregabalin  75 mg Oral BID   sodium chloride flush  10-40 mL Intracatheter Q12H   Continuous:  sodium chloride 10 mL/hr at 02/03/21 0616   acyclovir 700 mg (02/03/21 0648)   ampicillin-sulbactam (UNASYN) IV 3 g (02/03/21 0652)   dextrose 5 % and 0.9 % NaCl with KCl 20 mEq/L 50 mL/hr at 02/03/21 0211   levETIRAcetam 1,000 mg (02/03/21 0620)   vancomycin 750 mg (02/03/21 0919)   LZJ:QBHALP chloride, acetaminophen, fentaNYL (SUBLIMAZE) injection, LORazepam, sodium chloride flush   Objective:  Vital Signs  Vitals:   02/03/21 0000 02/03/21 0205 02/03/21 0400 02/03/21 0600  BP: (!) 145/93 (!) 146/91 (!) 156/97 (!) 162/96  Pulse: 94 89 79   Resp: (!) 22 (!) 24 (!) 21 (!) 23  Temp: (!) 100.4 F (38 C) 99.6 F (37.6 C) 98.7 F (37.1 C)   TempSrc: Oral Oral Oral   SpO2: 98% 99% 98%   Weight:      Height:        Intake/Output Summary (Last 24 hours) at 02/03/2021 1047 Last data filed at 02/03/2021 0300 Gross per 24 hour  Intake 200.28 ml  Output 700 ml  Net -499.72 ml   Filed Weights   02/01/21 0426 02/02/21 0500 02/02/21 1700  Weight: 68 kg 66.7 kg 70.8 kg    General appearance: Awake alert.  In no distress. noted to be distracted Resp: Clear to auscultation bilaterally.   Normal effort Cardio: S1-S2 is normal regular.  No S3-S4.  No rubs murmurs or bruit GI: Abdomen is soft.  Nontender nondistended.  Bowel sounds are present normal.  No masses organomegaly Extremities: No edema.  Full range of motion of lower extremities. Neurologic: No obvious focal deficits noted.  Noted to be disoriented.  Does not answer all questions appropriately.   Lab Results:  Data Reviewed: I have personally reviewed following labs and imaging studies  CBC: Recent Labs  Lab 01/28/21 1102 01/28/21 1125 01/29/21 0458 01/30/21 0321 01/31/21 0515 02/01/21 0516 02/03/21 0345  WBC 21.7*   < > 16.2* 11.6* 8.8 7.4 9.2  NEUTROABS 15.6*  --   --   --  6.1  --   --   HGB 13.1   < > 13.0 11.3* 10.1* 10.0* 11.2*  HCT 42.6   < > 39.3 33.5* 30.4* 31.6* 33.7*  MCV 87.7   < > 82.2 80.0 81.3 84.9 81.0  PLT 416*   < > 194 138* 120* 120* 175   < > =  values in this interval not displayed.    Basic Metabolic Panel: Recent Labs  Lab 01/28/21 1324 01/28/21 2244 01/29/21 0458 01/29/21 1631 01/30/21 0321 01/30/21 1556 01/31/21 0515 02/01/21 0516 02/02/21 0633 02/03/21 0345  NA 139   < > 141 138 135 142 139 139 137 137  K >7.5*   < > 4.4 3.4* 3.2* 3.1* 3.0* 3.8 3.7 4.0  CL 112*   < > 101 101 102 110 108 105 102 105  CO2 20*   < > 21* '26 24 24 23 28 23 ' 21*  GLUCOSE 139*   < > 199* 124* 156* 126* 116* 80 91 98  BUN 14   < > '18 17 20 15 15 11 13 13  ' CREATININE 3.08*   < > 2.26* 1.42* 1.09 0.91 0.89 1.06 1.01 0.98  CALCIUM 6.4*   < > 7.3* 7.8* 7.9* 7.6* 8.0* 8.0* 8.5* 8.5*  MG 2.0  --  1.8  --  2.4  --  2.2 2.1  --   --   PHOS 5.1*  --  3.8 3.2 1.7* 1.6* 1.2* 3.4  --   --    < > = values in this interval not displayed.    GFR: Estimated Creatinine Clearance: 108.6 mL/min (by C-G formula based on SCr of 0.98 mg/dL).  Liver Function Tests: Recent Labs  Lab 01/28/21 1102 01/28/21 1324 01/29/21 0458 01/30/21 1556 01/31/21 0515 02/01/21 0516 02/02/21 0633 02/03/21 0345  AST  103* 129*  --   --  560*  --  365* 223*  ALT 117* 106*  --   --  748*  --  474* 359*  ALKPHOS 71 56  --   --  71  --  105 93  BILITOT 0.7 0.9  --   --  1.1  --  1.6* 1.4*  PROT 6.3* 4.8*  --   --  5.5*  --  5.8* 6.2*  ALBUMIN 3.7 2.9*   < > 2.7* 2.9* 2.7* 2.8* 3.0*   < > = values in this interval not displayed.    Recent Labs  Lab 01/28/21 1324  LIPASE 22  AMYLASE 537*   Recent Labs  Lab 01/28/21 1116  AMMONIA 75*    Coagulation Profile: Recent Labs  Lab 01/28/21 1324  INR 1.2    Cardiac Enzymes: Recent Labs  Lab 01/28/21 1324  CKTOTAL 16,288*      CBG: Recent Labs  Lab 02/01/21 2306 02/02/21 0308 02/02/21 0707 02/02/21 1224 02/02/21 1639  GLUCAP 70 73 70 97 100*     Recent Results (from the past 240 hour(s))  Resp Panel by RT-PCR (Flu A&B, Covid) Nasopharyngeal Swab     Status: None   Collection Time: 01/28/21 11:54 AM   Specimen: Nasopharyngeal Swab; Nasopharyngeal(NP) swabs in vial transport medium  Result Value Ref Range Status   SARS Coronavirus 2 by RT PCR NEGATIVE NEGATIVE Final    Comment: (NOTE) SARS-CoV-2 target nucleic acids are NOT DETECTED.  The SARS-CoV-2 RNA is generally detectable in upper respiratory specimens during the acute phase of infection. The lowest concentration of SARS-CoV-2 viral copies this assay can detect is 138 copies/mL. A negative result does not preclude SARS-Cov-2 infection and should not be used as the sole basis for treatment or other patient management decisions. A negative result may occur with  improper specimen collection/handling, submission of specimen other than nasopharyngeal swab, presence of viral mutation(s) within the areas targeted by this assay, and inadequate number of viral copies(<138 copies/mL). A  negative result must be combined with clinical observations, patient history, and epidemiological information. The expected result is Negative.  Fact Sheet for Patients:   EntrepreneurPulse.com.au  Fact Sheet for Healthcare Providers:  IncredibleEmployment.be  This test is no t yet approved or cleared by the [place] FDA and  has been authorized for detection and/or diagnosis of SARS-CoV-2 by FDA under an Emergency Use Authorization (EUA). This EUA will remain  in effect (meaning this test can be used) for the duration of the COVID-19 declaration under Section 564(b)(1) of the Act, 21 U.S.C.section 360bbb-3(b)(1), unless the authorization is terminated  or revoked sooner.       Influenza A by PCR NEGATIVE NEGATIVE Final   Influenza B by PCR NEGATIVE NEGATIVE Final    Comment: (NOTE) The Xpert Xpress SARS-CoV-2/FLU/RSV plus assay is intended as an aid in the diagnosis of influenza from Nasopharyngeal swab specimens and should not be used as a sole basis for treatment. Nasal washings and aspirates are unacceptable for Xpert Xpress SARS-CoV-2/FLU/RSV testing.  Fact Sheet for Patients: EntrepreneurPulse.com.au  Fact Sheet for Healthcare Providers: IncredibleEmployment.be  This test is not yet approved or cleared by the [place] FDA and has been authorized for detection and/or diagnosis of SARS-CoV-2 by FDA under an Emergency Use Authorization (EUA). This EUA will remain in effect (meaning this test can be used) for the duration of the COVID-19 declaration under Section 564(b)(1) of the Act, 21 U.S.C. section 360bbb-3(b)(1), unless the authorization is terminated or revoked.  Performed at [facility], [place] [address] [place], [place] [address]   Culture, blood (routine x 2)     Status: None   Collection Time: 01/28/21 12:51 PM   Specimen: BLOOD  Result Value Ref Range Status   Specimen Description BLOOD LEFT ANTECUBITAL  Final   Special Requests   Final    BOTTLES DRAWN AEROBIC ONLY Blood Culture adequate volume   Culture   Final    NO GROWTH 5  DAYS Performed at [facility], [place] [address] [place], [place] [address]    Report Status 02/02/2021 FINAL  Final  Urine Culture     Status: None   Collection Time: 01/28/21 12:51 PM   Specimen: Urine, Catheterized  Result Value Ref Range Status   Specimen Description URINE, CATHETERIZED  Final   Special Requests NONE  Final   Culture   Final    NO GROWTH Performed at [place] [address], [place], [place] [address]    Report Status 01/30/2021 FINAL  Final  CSF culture w Gram Stain     Status: None   Collection Time: 01/28/21  5:24 PM   Specimen: CSF  Result Value Ref Range Status   Specimen Description CSF  Final   Special Requests CSF  Final   Gram Stain NO ORGANISMS SEEN CYTOSPIN SMEAR   Final   Culture   Final    NO GROWTH 3 DAYS Performed at [facility], [place] [address], [place],  [address]    Report Status 02/01/2021 FINAL  Final  Culture, blood (routine x 2)     Status: None   Collection Time: 01/28/21  5:25 PM   Specimen: BLOOD  Result Value Ref Range Status   Specimen Description BLOOD LEFT ANTECUBITAL  Final   Special Requests   Final    BOTTLES DRAWN AEROBIC AND ANAEROBIC Blood Culture results may not be optimal due to an inadequate volume of blood received in culture  bottles   Culture   Final    NO GROWTH 5 DAYS Performed at [facility], [place] [address], [place], [place] [address]    Report Status 02/02/2021 FINAL  Final  Culture, Respiratory w Gram Stain (tracheal aspirate)     Status: None   Collection Time: 01/28/21 11:17 PM   Specimen: Tracheal Aspirate; Respiratory  Result Value Ref Range Status   Specimen Description TRACHEAL ASPIRATE  Final   Special Requests Immunocompromised  Final   Gram Stain   Final    FEW SQUAMOUS EPITHELIAL CELLS PRESENT ABUNDANT WBC PRESENT, PREDOMINANTLY PMN FEW GRAM POSITIVE COCCI    Culture   Final    RARE Consistent with normal respiratory flora. No Pseudomonas species  isolated Performed at [place] [address], [place], [place] [address]    Report Status 01/31/2021 FINAL  Final  Culture, blood (routine x 2)     Status: None   Collection Time: 01/29/21  4:01 PM   Specimen: BLOOD  Result Value Ref Range Status   Specimen Description BLOOD LEFT ANTECUBITAL  Final   Special Requests   Final    BOTTLES DRAWN AEROBIC AND ANAEROBIC Blood Culture adequate volume   Culture   Final    NO GROWTH 5 DAYS Performed at [facility], [place] [address], [place], [place] [address]    Report Status 02/03/2021 FINAL  Final  Culture, blood (routine x 2)     Status: None   Collection Time: 01/29/21  4:07 PM   Specimen: BLOOD LEFT HAND  Result Value Ref Range Status   Specimen Description BLOOD LEFT HAND  Final   Special Requests   Final    BOTTLES DRAWN AEROBIC ONLY Blood Culture adequate volume   Culture   Final    NO GROWTH 5 DAYS Performed at [facility], [place] [address] [place], [place] [address]    Report Status 02/03/2021 FINAL  Final  Respiratory (~20 pathogens) panel by PCR     Status: None   Collection Time: 01/29/21  4:17 PM   Specimen: Nasopharyngeal Swab; Respiratory  Result Value Ref Range Status   Adenovirus NOT DETECTED NOT DETECTED Final   Coronavirus 229E NOT DETECTED NOT DETECTED Final    Comment: (NOTE) The Coronavirus on the Respiratory Panel, DOES NOT test for the novel  Coronavirus (2019 nCoV)    Coronavirus HKU1 NOT DETECTED NOT DETECTED Final   Coronavirus NL63 NOT DETECTED NOT DETECTED Final   Coronavirus OC43 NOT DETECTED NOT DETECTED Final   Metapneumovirus NOT DETECTED NOT DETECTED Final   Rhinovirus / Enterovirus NOT DETECTED NOT DETECTED Final   Influenza A NOT DETECTED NOT DETECTED Final   Influenza B NOT DETECTED NOT DETECTED Final   Parainfluenza Virus 1 NOT DETECTED NOT DETECTED Final   Parainfluenza Virus 2 NOT DETECTED NOT DETECTED Final   Parainfluenza Virus 3 NOT DETECTED NOT DETECTED Final    Parainfluenza Virus 4 NOT DETECTED NOT DETECTED Final   Respiratory Syncytial Virus NOT DETECTED NOT DETECTED Final   Bordetella pertussis NOT DETECTED NOT DETECTED Final   Bordetella Parapertussis NOT DETECTED NOT DETECTED Final   Chlamydophila pneumoniae NOT DETECTED NOT DETECTED Final   Mycoplasma pneumoniae NOT DETECTED NOT DETECTED Final    Comment: Performed at [facility], [place] [address] [place], [place] [address]  Blastomyces Antigen     Status: None   Collection Time: 01/29/21  6:30 PM   Specimen: Blood  Result  Value Ref Range Status   Blastomyces Antigen None Detected None Detected ng/mL Final    Comment: (NOTE) Results reported as ng/mL in 0.2 - 14.7 ng/mL range Results above the limit of detection but below 0.2 ng/mL are reported as 'Positive, Below the Limit of Quantification' Results above 14.7 ng/mL are reported as 'Positive, Above the Limit of Quantification'    Specimen Type SERUM  Final    Comment: (NOTE) Performed At: [place], [place] [id number] Bruce Donath MD [id number]   MRSA Next Gen by PCR, Nasal     Status: Abnormal   Collection Time: 01/30/21  8:22 AM   Specimen: Nasal Mucosa; Nasal Swab  Result Value Ref Range Status   MRSA by PCR Next Gen DETECTED (A) NOT DETECTED Final    Comment: RESULT CALLED TO, READ BACK BY AND VERIFIED WITH: RN J.GRANDOS AT [phone] ON 01/30/2021 BY T.SAAD. (NOTE) The GeneXpert MRSA Assay (FDA approved for NASAL specimens only), is one component of a comprehensive MRSA colonization surveillance program. It is not intended to diagnose MRSA infection nor to guide or monitor treatment for MRSA infections. Test performance is not FDA approved in patients less than 46 years old. Performed at [facility], [place] [address] [place], [place] [address]       Radiology Studies: DG Chest Port 1 View  Result Date: 02/01/2021 CLINICAL DATA:  Status post chest tube removal.  EXAM: PORTABLE CHEST 1 VIEW COMPARISON:  Chest radiograph February 01, 2021. FINDINGS: Interval removal right IJ central venous catheter and left chest tube. Left IJ central venous catheter tip projects over the superior vena cava, stable. Stable cardiac and mediastinal contours. No consolidative pulmonary opacities. No pleural effusion or pneumothorax. IMPRESSION: Interval removal left chest tube.  No definite pneumothorax. Electronically Signed   By: Lovey Newcomer M.D.   On: 02/01/2021 16:35       LOS: 6 days   [facility] Pager on [email or web address]  02/03/2021, 10:47 AM

## 2021-02-03 NOTE — Progress Notes (Signed)
Assumed care of pt at 1945, pt in bed with soft wrist and ankle restraints and 2 male visitors at bedside. Pt talkative with visitors until they left at 2000.  Pt very impulsive, does not follow directions. Pt will be calm then suddenly try to get out of bed and pull at IV or telemetry monitors and does not respond to redirection. Ativan given when pt became very agitated, yelling/cussing at RN and pushing RN away and kicking at RN when RN trying to redirect pt not to get out of bed. Soft restraints remain on x 4, pt frequently re-educated on restraints.   After ativan pt more calm and cooperative but rarely and inconsistently follows directions. Not agitated but continues to suddenly try to get out of bed and pull at lines.  Answers some questions appropriately but mostly answers are nonsensical. Overnight, frequently talking about drinking alcohol or getting adderal or rambling about going somewhere. Pt talkative with RN but conversations are confused, pt goes back and forth from laughing and smiling then irritable.   RN in room frequently, several times per hr overnight. Restraints assessed more often than Q2 hrs and when RN in room, frequently released each extremity one at a time for full range of motion. Pt more calm when RN in room and when RN leaves, pt calling out for RN to return.

## 2021-02-04 DIAGNOSIS — G9341 Metabolic encephalopathy: Secondary | ICD-10-CM | POA: Diagnosis not present

## 2021-02-04 DIAGNOSIS — J939 Pneumothorax, unspecified: Secondary | ICD-10-CM | POA: Diagnosis not present

## 2021-02-04 DIAGNOSIS — J9601 Acute respiratory failure with hypoxia: Secondary | ICD-10-CM | POA: Diagnosis not present

## 2021-02-04 LAB — COMPREHENSIVE METABOLIC PANEL
ALT: 273 U/L — ABNORMAL HIGH (ref 0–44)
AST: 134 U/L — ABNORMAL HIGH (ref 15–41)
Albumin: 2.9 g/dL — ABNORMAL LOW (ref 3.5–5.0)
Alkaline Phosphatase: 88 U/L (ref 38–126)
Anion gap: 7 (ref 5–15)
BUN: 8 mg/dL (ref 6–20)
CO2: 24 mmol/L (ref 22–32)
Calcium: 8.7 mg/dL — ABNORMAL LOW (ref 8.9–10.3)
Chloride: 109 mmol/L (ref 98–111)
Creatinine, Ser: 1 mg/dL (ref 0.61–1.24)
GFR, Estimated: >60 mL/min (ref >60)
Glucose, Bld: 97 mg/dL (ref 70–99)
Potassium: 3.9 mmol/L (ref 3.5–5.1)
Sodium: 140 mmol/L (ref 135–145)
Total Bilirubin: 0.8 mg/dL (ref 0.3–1.2)
Total Protein: 6.1 g/dL — ABNORMAL LOW (ref 6.5–8.1)

## 2021-02-04 LAB — CBC
HCT: 35.6 % — ABNORMAL LOW (ref 39.0–52.0)
Hemoglobin: 11.8 g/dL — ABNORMAL LOW (ref 13.0–17.0)
MCH: 26.9 pg (ref 26.0–34.0)
MCHC: 33.1 g/dL (ref 30.0–36.0)
MCV: 81.1 fL (ref 80.0–100.0)
Platelets: 239 10*3/uL (ref 150–400)
RBC: 4.39 MIL/uL (ref 4.22–5.81)
RDW: 13.8 % (ref 11.5–15.5)
WBC: 8.2 10*3/uL (ref 4.0–10.5)
nRBC: 0 % (ref 0.0–0.2)

## 2021-02-04 LAB — JC VIRUS, PCR CSF: JC Virus PCR, CSF: NEGATIVE

## 2021-02-04 LAB — TOXOPLASMA GONDII, PCR: Toxoplasma Gondii, PCR: NEGATIVE

## 2021-02-04 MED ORDER — SODIUM CHLORIDE 0.9 % IV SOLN
2.0000 g | INTRAVENOUS | Status: DC
  Administered 2021-02-04 – 2021-02-05 (×2): 2 g via INTRAVENOUS
  Filled 2021-02-04 (×2): qty 20

## 2021-02-04 MED ORDER — VANCOMYCIN HCL 750 MG/150ML IV SOLN
750.0000 mg | Freq: Three times a day (TID) | INTRAVENOUS | Status: DC
  Administered 2021-02-04 – 2021-02-05 (×4): 750 mg via INTRAVENOUS
  Filled 2021-02-04 (×4): qty 150

## 2021-02-04 MED ORDER — BICTEGRAVIR-EMTRICITAB-TENOFOV 50-200-25 MG PO TABS
1.0000 | ORAL_TABLET | Freq: Every day | ORAL | Status: DC
  Administered 2021-02-04 – 2021-02-08 (×5): 1 via ORAL
  Filled 2021-02-04 (×5): qty 1

## 2021-02-04 MED ORDER — MUPIROCIN 2 % EX OINT
TOPICAL_OINTMENT | Freq: Two times a day (BID) | CUTANEOUS | Status: DC
  Administered 2021-02-06: 1 via NASAL
  Filled 2021-02-04 (×3): qty 22

## 2021-02-04 NOTE — Progress Notes (Signed)
Patient's significant other Candice presented to bedside at shift change. Informed nursing staff that she was patient's wife, presented patient's Veteran benefit identification card and requested patient be transferred to [facility]. Shortly thereafter, significant other informed nurse that patient wants to be transferred to [facility]. Patient restless and confused, however clearly articulated that he was upset about being "hog tied" and wanted to smoke a cigarette. Reassurance provided to both patient and significant other. Patient safety goals explained. On call physician notified of request for transfer with plans to speak to patient and spouse at bedside. No emergency contact information listed in patient's chart for verification. Patient's cell phone was plugged in at bedside, however patient could not remember passcode. Phone taken home by significant other. Candice provided the following contact numbers to reach her if needed: [phone] / [phone]

## 2021-02-04 NOTE — Progress Notes (Addendum)
[facility] PROGRESS NOTE   Howard Rowland DEY:[id number] DOB: 1990/06/26 DOA: 01/28/2021  PCP: Pcp, No  Brief History/Interval Summary: 30 y/o male with HIV admitted after being found down in a hotel room with presumed narcotic overdose.  Many recent visits to ER with behavioral health involvement regarding depression.  Admitted to the ICU.  Developed acute respiratory failure due to aspiration pneumonia.  Also noted to have pneumothorax after central line was placed.  Had chest tube placed.  Stabilized and then transferred to the floor.  Continues to have altered mentation.  Concern also raised for endocarditis.  Significant events: 8/30 admitted after being found unresponsive 8/31 left pneumothorax 9/02 extubated, off CRRT 9/03 Lt chest tubed and CVL removed 9/04 transfer to progressive care   Procedures: 8/30 ETT > 9/02 8/30 R IJ HD cath > 9/03 8/30 L [place] CVL > 8/31 8/30 R femoral arterial line > 9/02 8/31 L IJ CVL > 9/3 8/31 L chest tube > 9/03   Studies: 8/30 CT angiogram chest > no PE, bilateral lower lobe infiltrates, considerable bilateral hilar and mediastinal adenopathy 8/31 TTE >LVEF 40-45%, dyskinesis of the left ventricular, basal inferolateral wall, mitral valve regurgitation and thickening, consider TEE, aortic valve with mild sclerosis 9/01 CT chest >> improved aeration at Rt lung base and LLL, decreased adenopathy, small volume Lt PTX with chest tube in place, mild subcutaneous emphysema 9/01 MRI brain >> 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  No seizure or epileptiform activity noted.   Consultants: Nephrology, neurology, infectious disease   Antibiotics: Anti-infectives (From admission, onward)    Start     Dose/Rate Route Frequency Ordered Stop   02/04/21 1030  vancomycin (VANCOREADY) IVPB 750 mg/150 mL        750 mg 150 mL/hr over 60 Minutes Intravenous Every 8 hours  02/04/21 0930     02/04/21 1030  cefTRIAXone (ROCEPHIN) 2 g in sodium chloride 0.9 % 100 mL IVPB        2 g 200 mL/hr over 30 Minutes Intravenous Every 24 hours 02/04/21 0930     02/04/21 1015  bictegravir-emtricitabine-tenofovir AF (BIKTARVY) 50-200-25 MG per tablet 1 tablet        1 tablet Oral Daily 02/04/21 0915     02/03/21 0800  vancomycin (VANCOREADY) IVPB 500 mg/100 mL  Status:  Discontinued        500 mg 100 mL/hr over 60 Minutes Intravenous Every 8 hours 02/03/21 0444 02/03/21 0444   02/03/21 0800  vancomycin (VANCOREADY) IVPB 750 mg/150 mL  Status:  Discontinued        750 mg 150 mL/hr over 60 Minutes Intravenous Every 8 hours 02/03/21 0444 02/04/21 0914   02/03/21 0630  acyclovir (ZOVIRAX) 700 mg in dextrose 5 % 100 mL IVPB  Status:  Discontinued        700 mg 114 mL/hr over 60 Minutes Intravenous Every 8 hours 02/03/21 0458 02/04/21 0914   01/31/21 1600  Ampicillin-Sulbactam (UNASYN) 3 g in sodium chloride 0.9 % 100 mL IVPB  Status:  Discontinued        3 g 200 mL/hr over 30 Minutes Intravenous Every 8 hours 01/31/21 0918 01/31/21 1208   01/31/21 1500  vancomycin (VANCOREADY) IVPB 500 mg/100 mL  Status:  Discontinued        500 mg 100 mL/hr over 60 Minutes Intravenous Every 12 hours 01/31/21 1410 02/03/21 0443   01/31/21 1300  Ampicillin-Sulbactam (UNASYN) 3 g in sodium  chloride 0.9 % 100 mL IVPB  Status:  Discontinued        3 g 200 mL/hr over 30 Minutes Intravenous Every 6 hours 01/31/21 1208 02/04/21 0914   01/31/21 1000  dolutegravir (TIVICAY) tablet 50 mg  Status:  Discontinued        50 mg Oral Daily 01/31/21 0804 02/04/21 0915   01/31/21 1000  emtricitabine-tenofovir AF (DESCOVY) 200-25 MG per tablet 1 tablet  Status:  Discontinued        1 tablet Oral Daily 01/31/21 0804 02/04/21 0915   01/30/21 1845  dolutegravir (TIVICAY) tablet 50 mg  Status:  Discontinued        50 mg Per Tube Daily 01/30/21 1748 01/31/21 0804   01/30/21 1845  emtricitabine-tenofovir AF  (DESCOVY) 200-25 MG per tablet 1 tablet  Status:  Discontinued        1 tablet Per Tube Daily 01/30/21 1748 01/31/21 0804   01/29/21 1800  acyclovir (ZOVIRAX) 670 mg in dextrose 5 % 100 mL IVPB  Status:  Discontinued        10 mg/kg  67.1 kg 113.4 mL/hr over 60 Minutes Intravenous Every 24 hours 01/28/21 2150 02/03/21 0457   01/29/21 1800  vancomycin (VANCOREADY) IVPB 750 mg/150 mL  Status:  Discontinued        750 mg 150 mL/hr over 60 Minutes Intravenous Every 24 hours 01/28/21 2150 01/31/21 1410   01/29/21 1600  Ampicillin-Sulbactam (UNASYN) 3 g in sodium chloride 0.9 % 100 mL IVPB  Status:  Discontinued        3 g 200 mL/hr over 30 Minutes Intravenous Every 8 hours 01/29/21 1443 01/31/21 0918   01/29/21 1545  dolutegravir (TIVICAY) tablet 50 mg  Status:  Discontinued        50 mg Per Tube Daily 01/29/21 1445 01/30/21 0720   01/29/21 1545  emtricitabine-tenofovir AF (DESCOVY) 200-25 MG per tablet 1 tablet  Status:  Discontinued        1 tablet Per Tube Daily 01/29/21 1445 01/30/21 0720   01/29/21 0300  ceFEPIme (MAXIPIME) 2 g in sodium chloride 0.9 % 100 mL IVPB  Status:  Discontinued        2 g 200 mL/hr over 30 Minutes Intravenous Every 12 hours 01/28/21 1427 01/28/21 1432   01/28/21 2000  ceFEPIme (MAXIPIME) 2 g in sodium chloride 0.9 % 100 mL IVPB  Status:  Discontinued        2 g 200 mL/hr over 30 Minutes Intravenous Every 12 hours 01/28/21 1749 01/29/21 1407   01/28/21 1830  vancomycin (VANCOREADY) IVPB 1500 mg/300 mL        1,500 mg 150 mL/hr over 120 Minutes Intravenous  Once 01/28/21 1732 01/28/21 2022   01/28/21 1500  ceFEPIme (MAXIPIME) 2 g in sodium chloride 0.9 % 100 mL IVPB  Status:  Discontinued        2 g 200 mL/hr over 30 Minutes Intravenous Every 12 hours 01/28/21 1432 01/28/21 1749   01/28/21 1422  vancomycin variable dose per unstable renal function (pharmacist dosing)  Status:  Discontinued         Does not apply See admin instructions 01/28/21 1422 01/28/21 1732    01/28/21 1400  acyclovir (ZOVIRAX) 670 mg in dextrose 5 % 100 mL IVPB  Status:  Discontinued        10 mg/kg  67.1 kg 113.4 mL/hr over 60 Minutes Intravenous Every 12 hours 01/28/21 1338 01/28/21 2150   01/28/21 1300  ceFEPIme (MAXIPIME) 2 g  in sodium chloride 0.9 % 100 mL IVPB  Status:  Discontinued        2 g 200 mL/hr over 30 Minutes Intravenous  Once 01/28/21 1253 01/28/21 1431   01/28/21 1300  ampicillin (OMNIPEN) 1 g in sodium chloride 0.9 % 100 mL IVPB  Status:  Discontinued        1 g 300 mL/hr over 20 Minutes Intravenous  Once 01/28/21 1255 01/28/21 1423   01/28/21 1200  cefTRIAXone (ROCEPHIN) 2 g in sodium chloride 0.9 % 100 mL IVPB  Status:  Discontinued        2 g 200 mL/hr over 30 Minutes Intravenous  Once 01/28/21 1158 01/28/21 1253   01/28/21 1200  vancomycin (VANCOREADY) IVPB 1500 mg/300 mL  Status:  Discontinued        1,500 mg 150 mL/hr over 120 Minutes Intravenous  Once 01/28/21 1158 01/28/21 1732       Subjective/Interval History: Patient remains distracted and confused.  Does not answer all questions appropriately.  Denies any pain.  Does not appear to be any discomfort.  When repeatedly asked about this individual Candace who claims to be his wife he denies that she is his wife.       Assessment/Plan:  Acute metabolic encephalopathy from hypoxia in the setting of accidental drug overdose Patient was seen by neurology.  Underwent MRI and EEG.  Patient apparently had seizure activity after respiratory arrest.  EEG has not shown any epileptiform activity.  Patient however remains on Keppra. Apparently serum paraneoplastic panel was sent out to [facility] on 9/1.  These results are also pending. Remains confused distracted and impulsive.  Noted to be in restraints.  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  Underwent a lumbar puncture due to his acute confusion.  Was placed on broad-spectrum  antibiotics.  Currently he is noted to be on vancomycin and Unasyn.  Also noted to be on acyclovir.  CSF HSV IgM noted to be negative.   Blood cultures were negative however transthoracic echocardiogram raise concern for mitral valve thickening.  Discussed with infectious disease who would prefer to have a TEE to determine plan regarding antibiotics.  Patient remains encephalopathic though it appears that he has been making some improvement.  We will wait for his mentation improves some more before pursuing TEE.  At his current mentation he will be unable to provide informed consent.  There is no next of kin or healthcare proxy available for decision-making.  In view of this and due to concern for endocarditis ID recommendation is to do 6 weeks of antibiotics with vancomycin and ceftriaxone.  Start date will be 8/30.   If TEE can be performed in the next few days and if it does not show endocarditis we can reach out to ID to see if the treatment can be shortened. ID has cleared the patient for PICC line placement.  Aspiration pneumonia in the setting of HIV Unasyn was initiated on August 31.  Has been changed over to ceftriaxone.  Respiratory status is stable.  No longer on oxygen.     Acute respiratory failure with hypoxia/pneumothorax Developed left-sided pneumothorax after central line was placed.  Had a chest tube In ICU.  This has been removed.  Respiratory status noted to be stable.  History of depression/PTSD Apparently on Remeron and Lyrica prior to admission.  Resumed.  Mediastinal adenopathy Significance of this is unclear.  Might be reactive per pulmonology.  They recommend repeat imaging in a few weeks and then to determine if bronchoscopy with EBUS is needed.  History of HIV Remains on antiretroviral treatment.  Infectious disease has been following.  CD4 was 707 on 8/30.  Antiretroviral treatment modified by infectious disease.  Abnormal LFTs Thought to be secondary to shock as  well as acute illness.  LFTs improving gradually.  Continue to check periodically.  Acute kidney injury, resolved Thought to be secondary to rhabdomyolysis and ATN in the setting of shock.  Patient was on CRRT in the ICU.  A dialysis catheter was placed.  Nephrology was following.  Urine output was monitored.  Renal function has improved.  Dialysis was discontinued.  Dialysis catheter was removed.  Nephrology has signed off. Renal function has normalized.  Monitor urine output.  Intermittent urinary retention Overnight patient was noted to be retaining urine.  Required catheterization.  Bladder scans every shift.  Flomax.  Anemia of critical illness/mild thrombocytopenia Platelet counts appear to have improved.  Hemoglobin noted to be stable.  Dysphagia Seen by speech therapy.  Cleared for regular diet.  Deconditioning PT and OT evaluation.  CIR recommended.   DVT Prophylaxis: Subcutaneous heparin Code Status: Full code Family Communication: No family at bedside.  Patient apparently has a significant other by the name of [place].  It is not clear if patient and this individual are legally married.  Patient mentioned that he is not married to her.  A medical certification was requested from this individual which has not been provided yet.   Critical care medicine apparently has been in touch with patient's father as well.  But there is no contact listed for the father in the system. Disposition Plan: Unclear for now.  CIR recommended by PT however due to lack of family members we are unable to proceed with this.  Status is: Inpatient  Remains inpatient appropriate because:Altered mental status, IV treatments appropriate due to intensity of illness or inability to take PO, and Inpatient level of care appropriate due to severity of illness  Dispo: The patient is from: Home              Anticipated d/c is to:  To be determined              Patient currently is not medically stable to  d/c.   Difficult to place patient No       Medications: Scheduled:  bictegravir-emtricitabine-tenofovir AF  1 tablet Oral Daily   chlorhexidine gluconate (MEDLINE KIT)  15 mL Mouth Rinse BID   Chlorhexidine Gluconate Cloth  6 each Topical Daily   cloNIDine  0.2 mg Transdermal Weekly   heparin injection (subcutaneous)  5,000 Units Subcutaneous Q8H   mouth rinse  15 mL Mouth Rinse q12n4p   mirtazapine  45 mg Oral QHS   pregabalin  75 mg Oral BID   sodium chloride flush  10-40 mL Intracatheter Q12H   tamsulosin  0.4 mg Oral Daily   Continuous:  sodium chloride 10 mL/hr at 02/04/21 0636   cefTRIAXone (ROCEPHIN)  IV     dextrose 5 % and 0.9 % NaCl with KCl 20 mEq/L 50 mL/hr at 02/04/21 0636   levETIRAcetam Stopped (02/04/21 0428)   vancomycin 750 mg (02/04/21 0942)   KZL:DJTTSV chloride, acetaminophen, fentaNYL (SUBLIMAZE) injection, LORazepam, sodium chloride flush   Objective:  Vital Signs  Vitals:   02/03/21 2100 02/03/21 2300 02/04/21 0509 02/04/21 0514  BP: (!) 147/100 (!) 144/90 121/80   Pulse: 93 86 80  Resp: (!) 22 (!) 25 (!) 28 (!) 22  Temp: 98 F (36.7 C) 98 F (36.7 C) 98 F (36.7 C)   TempSrc: Axillary Axillary Oral   SpO2: 100% 96% 100%   Weight:      Height:        Intake/Output Summary (Last 24 hours) at 02/04/2021 1029 Last data filed at 02/04/2021 0647 Gross per 24 hour  Intake 3709.55 ml  Output 1850 ml  Net 1859.55 ml    Filed Weights   02/01/21 0426 02/02/21 0500 02/02/21 1700  Weight: 68 kg 66.7 kg 70.8 kg    General appearance: Awake alert.  In no distress.  Distracted Resp: Clear to auscultation bilaterally.  Normal effort Cardio: S1-S2 is normal regular.  No S3-S4.  No rubs murmurs or bruit GI: Abdomen is soft.  Nontender nondistended.  Bowel sounds are present normal.  No masses organomegaly Extremities: No edema.  Full range of motion of lower extremities. Neurologic: No obvious focal neurological deficits but he is noted to be  disoriented.  Noted to be in restraints.   Lab Results:  Data Reviewed: I have personally reviewed following labs and imaging studies  CBC: Recent Labs  Lab 01/28/21 1102 01/28/21 1125 01/30/21 0321 01/31/21 0515 02/01/21 0516 02/03/21 0345 02/04/21 0038  WBC 21.7*   < > 11.6* 8.8 7.4 9.2 8.2  NEUTROABS 15.6*  --   --  6.1  --   --   --   HGB 13.1   < > 11.3* 10.1* 10.0* 11.2* 11.8*  HCT 42.6   < > 33.5* 30.4* 31.6* 33.7* 35.6*  MCV 87.7   < > 80.0 81.3 84.9 81.0 81.1  PLT 416*   < > 138* 120* 120* 175 239   < > = values in this interval not displayed.     Basic Metabolic Panel: Recent Labs  Lab 01/28/21 1324 01/28/21 2244 01/29/21 0458 01/29/21 1631 01/30/21 0321 01/30/21 1556 01/31/21 0515 02/01/21 0516 02/02/21 0633 02/03/21 0345 02/04/21 0038  NA 139   < > 141 138 135 142 139 139 137 137 140  K >7.5*   < > 4.4 3.4* 3.2* 3.1* 3.0* 3.8 3.7 4.0 3.9  CL 112*   < > 101 101 102 110 108 105 102 105 109  CO2 20*   < > 21* '26 24 24 23 28 23 ' 21* 24  GLUCOSE 139*   < > 199* 124* 156* 126* 116* 80 91 98 97  BUN 14   < > '18 17 20 15 15 11 13 13 8  ' CREATININE 3.08*   < > 2.26* 1.42* 1.09 0.91 0.89 1.06 1.01 0.98 1.00  CALCIUM 6.4*   < > 7.3* 7.8* 7.9* 7.6* 8.0* 8.0* 8.5* 8.5* 8.7*  MG 2.0  --  1.8  --  2.4  --  2.2 2.1  --   --   --   PHOS 5.1*  --  3.8 3.2 1.7* 1.6* 1.2* 3.4  --   --   --    < > = values in this interval not displayed.     GFR: Estimated Creatinine Clearance: 106.4 mL/min (by C-G formula based on SCr of 1 mg/dL).  Liver Function Tests: Recent Labs  Lab 01/28/21 1324 01/29/21 0458 01/31/21 0515 02/01/21 0516 02/02/21 0633 02/03/21 0345 02/04/21 0038  AST 129*  --  560*  --  365* 223* 134*  ALT 106*  --  748*  --  474* 359* 273*  ALKPHOS 56  --  71  --  105 93 88  BILITOT 0.9  --  1.1  --  1.6* 1.4* 0.8  PROT 4.8*  --  5.5*  --  5.8* 6.2* 6.1*  ALBUMIN 2.9*   < > 2.9* 2.7* 2.8* 3.0* 2.9*   < > = values in this interval not displayed.      Recent Labs  Lab 01/28/21 1324  LIPASE 22  AMYLASE 537*    Recent Labs  Lab 01/28/21 1116  AMMONIA 75*     Coagulation Profile: Recent Labs  Lab 01/28/21 1324  INR 1.2     Cardiac Enzymes: Recent Labs  Lab 01/28/21 1324  CKTOTAL 16,288*       CBG: Recent Labs  Lab 02/01/21 2306 02/02/21 0308 02/02/21 0707 02/02/21 1224 02/02/21 1639  GLUCAP 70 73 70 97 100*      Recent Results (from the past 240 hour(s))  Resp Panel by RT-PCR (Flu A&B, Covid) Nasopharyngeal Swab     Status: None   Collection Time: 01/28/21 11:54 AM   Specimen: Nasopharyngeal Swab; Nasopharyngeal(NP) swabs in vial transport medium  Result Value Ref Range Status   SARS Coronavirus 2 by RT PCR NEGATIVE NEGATIVE Final    Comment: (NOTE) SARS-CoV-2 target nucleic acids are NOT DETECTED.  The SARS-CoV-2 RNA is generally detectable in upper respiratory specimens during the acute phase of infection. The lowest concentration of SARS-CoV-2 viral copies this assay can detect is 138 copies/mL. A negative result does not preclude SARS-Cov-2 infection and should not be used as the sole basis for treatment or other patient management decisions. A negative result may occur with  improper specimen collection/handling, submission of specimen other than nasopharyngeal swab, presence of viral mutation(s) within the areas targeted by this assay, and inadequate number of viral copies(<138 copies/mL). A negative result must be combined with clinical observations, patient history, and epidemiological information. The expected result is Negative.  Fact Sheet for Patients:  EntrepreneurPulse.com.au  Fact Sheet for Healthcare Providers:  IncredibleEmployment.be  This test is no t yet approved or cleared by the [place] FDA and  has been authorized for detection and/or diagnosis of SARS-CoV-2 by FDA under an Emergency Use Authorization (EUA). This EUA will  remain  in effect (meaning this test can be used) for the duration of the COVID-19 declaration under Section 564(b)(1) of the Act, 21 U.S.C.section 360bbb-3(b)(1), unless the authorization is terminated  or revoked sooner.       Influenza A by PCR NEGATIVE NEGATIVE Final   Influenza B by PCR NEGATIVE NEGATIVE Final    Comment: (NOTE) The Xpert Xpress SARS-CoV-2/FLU/RSV plus assay is intended as an aid in the diagnosis of influenza from Nasopharyngeal swab specimens and should not be used as a sole basis for treatment. Nasal washings and aspirates are unacceptable for Xpert Xpress SARS-CoV-2/FLU/RSV testing.  Fact Sheet for Patients: EntrepreneurPulse.com.au  Fact Sheet for Healthcare Providers: IncredibleEmployment.be  This test is not yet approved or cleared by the [place] FDA and has been authorized for detection and/or diagnosis of SARS-CoV-2 by FDA under an Emergency Use Authorization (EUA). This EUA will remain in effect (meaning this test can be used) for the duration of the COVID-19 declaration under Section 564(b)(1) of the Act, 21 U.S.C. section 360bbb-3(b)(1), unless the authorization is terminated or revoked.  Performed at [facility], [place] [address] [place], [place] [address]   Culture, blood (routine x 2)     Status: None   Collection Time: 01/28/21 12:51 PM  Specimen: BLOOD  Result Value Ref Range Status   Specimen Description BLOOD LEFT ANTECUBITAL  Final   Special Requests   Final    BOTTLES DRAWN AEROBIC ONLY Blood Culture adequate volume   Culture   Final    NO GROWTH 5 DAYS Performed at [facility], [address] [place], [place] [address]    Report Status 02/02/2021 FINAL  Final  Urine Culture     Status: None   Collection Time: 01/28/21 12:51 PM   Specimen: Urine, Catheterized  Result Value Ref Range Status   Specimen Description URINE, CATHETERIZED  Final   Special Requests NONE  Final    Culture   Final    NO GROWTH Performed at [place] [address], [place], [place] [address]    Report Status 01/30/2021 FINAL  Final  HSV 1/2 Ab IgG/IgM CSF     Status: None   Collection Time: 01/28/21 12:57 PM   Specimen: CSF; Cerebrospinal Fluid  Result Value Ref Range Status   HSV 1/2 Ab, IgM, CSF 0.11 <=0.89 IV Final    Comment: (NOTE) INTERPRETIVE INFORMATION: Herpes Simplex Virus                          Type 1 and/or 2 Antibodies,                          IgM by ELISA, CSF  0.89 IV or Less .......... Negative: No significant                             level of detectable HSV IgM                             antibody.  0.90 - 1.09 IV ........... Equivocal: Questionable                             presence of IgM antibodies.                             Repeat testing in 10-14 days                             may be helpful.  1.10 IV or Greater ....... Positive: IgM antibody to HSV                             detected, which may indicate a                             current or recent infection.                             However, low levels of IgM                             antibodies may occasionally  persist for more than 12                             months post-infection. The detection of antibodies to herpes simplex virus in CSF may indicate central nervou s system infection. However, consideration must be given to possible contamination by blood or transfer of serum antibodies across the blood-brain barrier. Fourfold or greater rise in CSF antibodies to herpes on specimens at least 4 weeks apart are found in 74-94 % of patients with herpes encephalitis. Specificity of the test based on a single CSF testing is not established. Presently PCR is the primary means of establishing a diagnosis of herpes encephalitis. This test was developed and its performance characteristics determined by [employer]. It has not been  cleared or approved by the [place] Food and Drug Administration. This test was performed in a CLIA certified laboratory and is intended for clinical purposes.    HSV 1/2 Ab Screen IgG, CSF 0.52 <=0.89 IV Final    Comment: (NOTE) INTERPRETIVE INFORMATION: Herpes Simplex Virus Type 1 and/or 2                    Antibodies, IgG CSF  0.89 IV or Less .......... Negative: No significant                             level of detectable HSV IgG                             antibody.  0.90 - 1.09 IV ........... Equivocal: Questionable                             presence of IgG antibodies.                             Repeat testing in 10-14 days                             may be helpful.  1.10 IV or Greater ....... Positive: IgG antibody to HSV                             detected, which may indicate                             a current or past HSV                             infection. The detection of antibodies to herpes simplex virus in CSF may indicate central nervous system infection. However, consideration must be given to possible contamination by blood or transfer of serum antibodies across the blood-brain barrier. Fourfold or greater rise in CSF antibodies to herpes on specimens at l east 4 weeks apart are found in 74-94 % of patients with herpes encephalitis. Specificity of the test based on a single CSF testing is not established. Presently PCR is the primary means of establishing a diagnosis of herpes encephalitis. This test was developed and its performance characteristics determined by [employer]. It has not been cleared or  approved by the [place] Food and Drug Administration. This test was performed in a CLIA certified laboratory and is intended for clinical purposes. **Effective March 10, 2021 094076 HSV 1/2 Ab IgG/IgM CSF will be**  made non-orderable. [facility] offers order code [id number] HSV 1/2  PCR, CSF. For further information, please contact your local  [facility]  Representative. Performed At: [facility] [address] [place], [place] [id number] Hilton Sinclair I MD RX:[id number]   CSF culture w Gram Stain     Status: None   Collection Time: 01/28/21  5:24 PM   Specimen: CSF  Result Value Ref Range Status   Specimen Description CSF  Final   Special Requests CSF  Final   Gram Stain NO ORGANISMS SEEN CYTOSPIN SMEAR   Final   Culture   Final    NO GROWTH 3 DAYS Performed at [facility], [place] [address] [place], [place] [address]    Report Status 02/01/2021 FINAL  Final  Culture, blood (routine x 2)     Status: None   Collection Time: 01/28/21  5:25 PM   Specimen: BLOOD  Result Value Ref Range Status   Specimen Description BLOOD LEFT ANTECUBITAL  Final   Special Requests   Final    BOTTLES DRAWN AEROBIC AND ANAEROBIC Blood Culture results may not be optimal due to an inadequate volume of blood received in culture bottles   Culture   Final    NO GROWTH 5 DAYS Performed at [facility], [place] [address] [place], [place] [address]    Report Status 02/02/2021 FINAL  Final  Culture, Respiratory w Gram Stain (tracheal aspirate)     Status: None   Collection Time: 01/28/21 11:17 PM   Specimen: Tracheal Aspirate; Respiratory  Result Value Ref Range Status   Specimen Description TRACHEAL ASPIRATE  Final   Special Requests Immunocompromised  Final   Gram Stain   Final    FEW SQUAMOUS EPITHELIAL CELLS PRESENT ABUNDANT WBC PRESENT, PREDOMINANTLY PMN FEW GRAM POSITIVE COCCI    Culture   Final    RARE Consistent with normal respiratory flora. No Pseudomonas species isolated Performed at [facility] [address], [place], [place] [address]    Report Status 01/31/2021 FINAL  Final  Culture, blood (routine x 2)     Status: None   Collection Time: 01/29/21  4:01 PM   Specimen: BLOOD  Result Value Ref Range Status   Specimen Description BLOOD LEFT ANTECUBITAL  Final   Special Requests   Final    BOTTLES DRAWN  AEROBIC AND ANAEROBIC Blood Culture adequate volume   Culture   Final    NO GROWTH 5 DAYS Performed at [facility], [place] [address] [place], [place] [address]    Report Status 02/03/2021 FINAL  Final  Culture, blood (routine x 2)     Status: None   Collection Time: 01/29/21  4:07 PM   Specimen: BLOOD LEFT HAND  Result Value Ref Range Status   Specimen Description BLOOD LEFT HAND  Final   Special Requests   Final    BOTTLES DRAWN AEROBIC ONLY Blood Culture adequate volume   Culture   Final    NO GROWTH 5 DAYS Performed at [facility], [place] [address], [place],  [address]    Report Status 02/03/2021 FINAL  Final  Respiratory (~20 pathogens) panel by PCR     Status: None   Collection Time: 01/29/21  4:17 PM  Specimen: Nasopharyngeal Swab; Respiratory  Result Value Ref Range Status   Adenovirus NOT DETECTED NOT DETECTED Final   Coronavirus 229E NOT DETECTED NOT DETECTED Final    Comment: (NOTE) The Coronavirus on the Respiratory Panel, DOES NOT test for the novel  Coronavirus (2019 nCoV)    Coronavirus HKU1 NOT DETECTED NOT DETECTED Final   Coronavirus NL63 NOT DETECTED NOT DETECTED Final   Coronavirus OC43 NOT DETECTED NOT DETECTED Final   Metapneumovirus NOT DETECTED NOT DETECTED Final   Rhinovirus / Enterovirus NOT DETECTED NOT DETECTED Final   Influenza A NOT DETECTED NOT DETECTED Final   Influenza B NOT DETECTED NOT DETECTED Final   Parainfluenza Virus 1 NOT DETECTED NOT DETECTED Final   Parainfluenza Virus 2 NOT DETECTED NOT DETECTED Final   Parainfluenza Virus 3 NOT DETECTED NOT DETECTED Final   Parainfluenza Virus 4 NOT DETECTED NOT DETECTED Final   Respiratory Syncytial Virus NOT DETECTED NOT DETECTED Final   Bordetella pertussis NOT DETECTED NOT DETECTED Final   Bordetella Parapertussis NOT DETECTED NOT DETECTED Final   Chlamydophila pneumoniae NOT DETECTED NOT DETECTED Final   Mycoplasma pneumoniae NOT DETECTED NOT DETECTED Final    Comment:  Performed at [facility], [place]. [address], [place], [place] [address]  Blastomyces Antigen     Status: None   Collection Time: 01/29/21  6:30 PM   Specimen: Blood  Result Value Ref Range Status   Blastomyces Antigen None Detected None Detected ng/mL Final    Comment: (NOTE) Results reported as ng/mL in 0.2 - 14.7 ng/mL range Results above the limit of detection but below 0.2 ng/mL are reported as 'Positive, Below the Limit of Quantification' Results above 14.7 ng/mL are reported as 'Positive, Above the Limit of Quantification'    Specimen Type SERUM  Final    Comment: (NOTE) Performed At: [place], [place] [id number] Bruce Donath MD DH:[id number]   Andee Poles, PCR     Status: None   Collection Time: 01/30/21  3:21 AM   Specimen: Blood  Result Value Ref Range Status   Toxoplasma Gondii, PCR Negative Negative Final    Comment: (NOTE) No Toxoplasma gondii DNA detected. This test was developed and its performance characteristics determined by [employer]. It has not been cleared or approved by the U.S. Food and Drug Administration. The FDA has determined that such clearance or approval is not necessary. This test is used for clinical purposes. It should not be regarded as investigational or research. Performed At: [facility] [place], [place] [id number] Rush Farmer MD QM:[id number]   MRSA Next Gen by PCR, Nasal     Status: Abnormal   Collection Time: 01/30/21  8:22 AM   Specimen: Nasal Mucosa; Nasal Swab  Result Value Ref Range Status   MRSA by PCR Next Gen DETECTED (A) NOT DETECTED Final    Comment: RESULT CALLED TO, READ BACK BY AND VERIFIED WITH: RN J.GRANDOS AT [phone] ON 01/30/2021 BY T.SAAD. (NOTE) The GeneXpert MRSA Assay (FDA approved for NASAL specimens only), is one component of a comprehensive MRSA colonization surveillance program. It is not intended to diagnose MRSA  infection nor to guide or monitor treatment for MRSA infections. Test performance is not FDA approved in patients less than 14 years old. Performed at [facility], [place] [address] [place], [place] [address]        Radiology Studies: No results found.     LOS: 7 days  Caly Pellum [employer]  [employer] operational officer on [email or web address]  02/04/2021, 10:29 AM

## 2021-02-04 NOTE — Progress Notes (Addendum)
NAME:  Howard Rowland, MRN:  [id number], DOB:  10-14-1990, LOS: 7 ADMISSION DATE:  01/28/2021, CONSULTATION DATE:  8/30 REFERRING MD:  Karene Fry, CHIEF COMPLAINT:  Unresponsive resulting in intubation   History of Present Illness:  30 y/o male with HIV admitted after being found down in a hotel room with presumed narcotic overdose.  Many recent visits to ER with behavioral health involvement regarding depression.   Pertinent  Medical History  MDD, PTSD, Cocaine abuse, HIV  Significant Hospital Events: Including procedures, antibiotic start and stop dates in addition to other pertinent events   8/30 admitted after being found unresponsive 8/31 left pneumothorax 9/02 extubated, off CRRT 9/03 Lt chest tubed and CVL removed 9/04 transfer to progressive care  Procedures: 8/30 ETT > 9/02 8/30 R IJ HD cath > 9/03 8/30 L Maurice CVL > 8/31 8/30 R femoral arterial line > 9/02 8/31 L IJ CVL > 9/3 8/31 L chest tube > 9/03  Studies: 8/30 CT angiogram chest > no PE, bilateral lower lobe infiltrates, considerable bilateral hilar and mediastinal adenopathy 8/31 TTE >LVEF 40-45%, dyskinesis of the left ventricular, basal inferolateral wall, mitral valve regurgitation and thickening, consider TEE, aortic valve with mild sclerosis 9/01 CT chest >> improved aeration at Rt lung base and LLL, decreased adenopathy, small volume Lt PTX with chest tube in place, mild subcutaneous emphysema 9/01 MRI brain >> 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 No complaints for me today.   Objective   Blood pressure (!) 161/97, pulse 81, temperature 98.8 F (37.1 C), temperature source Oral, resp. rate (!) 23, height 5\' 8"  (1.727 m), weight 70.8 kg, SpO2 100 %.        Intake/Output Summary (Last 24 hours) at 02/04/2021 1428 Last data filed at 02/04/2021 0647 Gross per 24 hour  Intake 3308.01 ml  Output 1350 ml  Net 1958.01  ml    Filed Weights   02/01/21 0426 02/02/21 0500 02/02/21 1700  Weight: 68 kg 66.7 kg 70.8 kg    Examination: General:  young adult male in NAD Neuro:  Alert, oriented to self and place. "2026". Remains delirious  HEENT:  [place]/AT, No JVD noted Cardiovascular:  Patient declined Lungs:  patient declined Abdomen:  Patient declined. Non-distended Musculoskeletal:  No obvious deformity Skin:  Patient declined examination.     Resolved Hospital Problem list   Septic shock, Acute hypoxic respiratory failure from aspiration pneumonitis, AKI, Hypokalemia, Hypophosphatemia, Lt pneumothorax  Assessment & Plan:   Mediastinal adenopathy: difficult to determine significance of this and there has been some improvement in very short term follow up CT imaging. May just be reactive, but given his medical history they are likely worthwhile to biopsy. Patient is currently unable to consent and there is some ambiguity surrounding his decision makers. - If patient improves to the point he is able to consent and tolerate bronchoscopy please re-consult so we can evaluate him for bronchoscopy/EBUS.   Acute metabolic encephalopathy from hypoxia in setting of accidental drug overdose. Seizure after respiratory arrest with hypoxia. Hx of Depression, PTSD. - per primary  Aspiration pneumonia. Possible endocarditis Mitral valve thickening. Hx of HIV. - per ID - might need TEE at some point; once more stable mental status can reassess  PCCM will sign off for the time being.    Labs    CMP Latest Ref Rng & Units 02/04/2021 02/03/2021 02/02/2021  Glucose 70 - 99 mg/dL 97 98 91  BUN 6 - 20 mg/dL 8 13 13   Creatinine 0.61 - 1.24 mg/dL 2.42 3.53  Sodium 135 - 145 mmol/L 140 137 137  Potassium 3.5 - 5.1 mmol/L 3.9 4.0 3.7  Chloride 98 - 111 mmol/L 109 105 102  CO2 22 - 32 mmol/L 24 21(L) 23  Calcium 8.9 - 10.3 mg/dL 6.14) 4.3(X) 5.4(M)  Total Protein 6.5 - 8.1 g/dL 6.1(L) 6.2(L) 5.8(L)  Total  Bilirubin 0.3 - 1.2 mg/dL 0.8 0.8(Q) 7.6(P)  Alkaline Phos 38 - 126 U/L 88 93 105  AST 15 - 41 U/L 134(H) 223(H) 365(H)  ALT 0 - 44 U/L 273(H) 359(H) 474(H)    CBC Latest Ref Rng & Units 02/04/2021 02/03/2021 02/01/2021  WBC 4.0 - 10.5 K/uL 8.2 9.2 7.4  Hemoglobin 13.0 - 17.0 g/dL 11.8(L) 11.2(L) 10.0(L)  Hematocrit 39.0 - 52.0 % 35.6(L) 33.7(L) 31.6(L)  Platelets 150 - 400 K/uL 239 175 120(L)    ABG    Component Value Date/Time   PHART 7.444 01/29/2021 0444   PCO2ART 26.3 (L) 01/29/2021 0444   PO2ART 121 (H) 01/29/2021 0444   HCO3 18.1 (L) 01/29/2021 0444   TCO2 19 (L) 01/29/2021 0444   ACIDBASEDEF 5.0 (H) 01/29/2021 0444   O2SAT 98.4 01/29/2021 1002    CBG (last 3)  Recent Labs    02/02/21 0707 02/02/21 1224 02/02/21 1639  GLUCAP 70 97 100*    Signature:    04/04/21, AGACNP-BC [facility]  See Amion for personal pager PCCM on call pager [phone] until 7pm. Please call Elink 7p-7a. [phone]  02/04/2021 2:38 PM

## 2021-02-04 NOTE — Progress Notes (Signed)
Pt continues to be in restraints, unable to participate in assessment.  CSW will continue to follow for discharge needs. Daleen Squibb, MSW, LCSW 9/6/20221:41 PM

## 2021-02-04 NOTE — Progress Notes (Signed)
Physical Therapy Treatment Patient Details Name: Howard Rowland MRN: [id number] DOB: June 02, 1990 Today's Date: 02/04/2021    History of Present Illness [name] with PTSD, MDD, HIV BIBEMS on 8/30 after being found unresponsive by bystander. He was severely hypotensive on evaluation by EMS. On ED arrival noted to have seizure activity and he was intubated for airway protection in setting of his encephalopathy.ARF, concern for rhabdo, acute encephalopathy. Extubated 9/2. 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    PT Comments    Pt initially agreeable to session, requesting music during. During transition to EOB, pt became increasingly irritable and combative, requiring return to supine. Pt requiring max +2 assist for to/from EOB mobility at this time, progress today limited by agitation. Will continue to follow.    Follow Up Recommendations  SNF     Equipment Recommendations  Rolling walker with 5" wheels;3in1 (PT);Wheelchair (measurements PT);Wheelchair cushion (measurements PT) (will update as pt progresses)    Recommendations for Other Services       Precautions / Restrictions Precautions Precautions: Fall Precaution Comments: 4 pt restraints Restrictions Weight Bearing Restrictions: No    Mobility  Bed Mobility Overal bed mobility: Needs Assistance Bed Mobility: Supine to Sit;Sit to Supine     Supine to sit: Max assist;+2 for physical assistance Sit to supine: Max assist;+2 for physical assistance   General bed mobility comments: max +2 for trunk and LE management, scooting to/from EOB.    Transfers                 General transfer comment: NT - pt too agitated  Ambulation/Gait                 Stairs             Wheelchair Mobility    Modified Rankin (Stroke Patients Only)       Balance   Sitting-balance support: No upper extremity supported;Feet supported Sitting balance-Leahy Scale: Fair Sitting  balance - Comments: able to sit EOB without PT assist, cannot accept challenge Postural control: Posterior lean     Standing balance comment: nt                            Cognition Arousal/Alertness: Lethargic Behavior During Therapy: Restless;Agitated;Flat affect Overall Cognitive Status: No family/caregiver present to determine baseline cognitive functioning Area of Impairment: Orientation;Attention;Following commands;Safety/judgement;Awareness;Problem solving                 Orientation Level: Disoriented to;Situation Current Attention Level: Focused   Following Commands: Follows one step commands inconsistently;Follows one step commands with increased time Safety/Judgement: Decreased awareness of safety;Decreased awareness of deficits Awareness: Intellectual Problem Solving: Slow processing;Difficulty sequencing;Decreased initiation;Requires verbal cues;Requires tactile cues General Comments: Pt pleasant initially, requesting to listen to "radioactive" and "[place]" during session. Once starting to mobilize and at EOB, pt begins yelling at PT and flailing arms at this PT, states "stop" and when PT requested pt lay back down pt states "you lay back down" with arms in high guard. Pt states "I don't want to do this hospital sh!!, I need to go to my barber and feel like a person".      Exercises      General Comments General comments (skin integrity, edema, etc.): pt's IV leaking, leaked on PT's arm. PT washed arm thoroughly and contacted RN.      Pertinent Vitals/Pain Pain Assessment: Faces Faces Pain Scale: Hurts little  more Pain Location: generalized, states "ow" during mobility but does not localize Pain Descriptors / Indicators: Grimacing;Moaning Pain Intervention(s): Limited activity within patient's tolerance;Monitored during session;Repositioned    Home Living                      Prior Function            PT Goals (current  goals can now be found in the care plan section) Acute Rehab PT Goals Patient Stated Goal: unable PT Goal Formulation: With patient Time For Goal Achievement: 02/16/21 Potential to Achieve Goals: Fair Progress towards PT goals: Progressing toward goals    Frequency    Min 3X/week      PT Plan Discharge plan needs to be updated    Co-evaluation              AM-PAC PT "6 Clicks" Mobility   Outcome Measure  Help needed turning from your back to your side while in a flat bed without using bedrails?: A Lot Help needed moving from lying on your back to sitting on the side of a flat bed without using bedrails?: Total Help needed moving to and from a bed to a chair (including a wheelchair)?: Total Help needed standing up from a chair using your arms (e.g., wheelchair or bedside chair)?: Total Help needed to walk in hospital room?: Total Help needed climbing 3-5 steps with a railing? : Total 6 Click Score: 7    End of Session Equipment Utilized During Treatment: Gait belt Activity Tolerance: Patient tolerated treatment well Patient left: in bed;with call bell/phone within reach;with bed alarm set;Other (comment) (wrist and ankle restraints) Nurse Communication: Mobility status PT Visit Diagnosis: Other abnormalities of gait and mobility (R26.89);Muscle weakness (generalized) (M62.81);Other symptoms and signs involving the nervous system (R29.898);Pain Pain - Right/Left: Right Pain - part of body: Shoulder (and scapula)     Time: 6629-4765 PT Time Calculation (min) (ACUTE ONLY): 17 min  Charges:  $Therapeutic Activity: 8-22 mins                     Howard Rowland, PT DPT Acute Rehabilitation Services Pager [phone]  Office [phone]    Howard Rowland 02/04/2021, 1:26 PM

## 2021-02-04 NOTE — Progress Notes (Addendum)
[facility] Infectious Diseases Follow Up Note  Patient Identification: Patient Name: Howard Rowland MRN: [id number] Admit Date: 01/28/2021 10:55 AM Age: 30 y.o.Today's Date: 02/04/2021   Reason for Visit:TTE with MV thickening   Active Problems:   Respiratory failure (HCC)   Shock (HCC)   Status epilepticus (HCC)   Pneumothorax, left   HIV disease (HCC)   Altered mental status   Antibiotics: Unasyn 8/31-current                     Vancomcyin 8/30-current                     Cefepime 8/30-8/31 Acyclovir 8/30-current  ART: Dolutegravir and Tivicay   Lines/Tubes: PIV, external urethral catheter  Interval Events: afebrile for more than 24 hrs, no leukocytosis. Hemodynamically stable. Blood cultures have remained negative to date   Assessment Toxic/metabolic encephalopathy in the setting h/o MDD/PTSD and polysubstance abuse with possible drug overdose Aspiration/pneumonitis/PNA 3.   Bilateral Hilar and mediastinal lymphadenopathy - improving in repeat CT Chest. Fu with Pulmonology  4.   HIV, well controlled: On Descovy and Tivicay 5.   Seizures - in the setting of polysubstance abuse and drug overdose, HSV IgM and IgG negative in CSF  6.   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 His blood cultures on 8/30 were done prior abtx and have remain no growth. All other blood cultures have remained negative. However, TTE showed moderate MV thickening concerning for endocarditis. TEE has not been able to be performed given patients mental status and inability to ger consent from family. Hence, Would err in the side of caution and empirically plan to treat as endocarditis. TEE to be performed when able to be done.   -Continue Vancomycin,  pharmacy to dose -Switch Unasyn to ceftriaxone after 7 days of Unasyn -Plan to continue Vancomycin and ceftriaxone for 6 weeks from 8/30 until TEE can be performed  -Monitor CBC, BMP and Vancomycin trough  -OK to place a PICC line  -Will switch Descovy and Tivicay to [place] now he is extubated and able to take PO. [facility] Pharmacy to ensure that he has 30 days supply when discharged.  -Management of Psychiatric conditions per Primary and Psychiatry  -ID will be available as needed. Please call with questions   Rest of the management as per the primary team. Thank you for the consult. Please page with pertinent questions or concerns.  ______________________________________________________________________ Subjective patient seen and examined at the bedside. He is oriented to name and place but talks randomly and incoherent speech. He follows commands.   Vitals BP 121/80 (BP Location: Right Arm)   Pulse 80   Temp 98 F (36.7 C) (Oral)   Resp (!) 22   Ht  (1.727 m)   Wt 70.8 kg   SpO2 100%   BMI 23.73 kg/m     Physical Exam Constitutional:  Restrained in all 4 extremities     Comments:   Cardiovascular:     Rate and Rhythm: Normal rate and regular rhythm.     Heart sounds:.   Pulmonary:     Effort: Pulmonary effort is normal.     Comments: clear lung sounds bilaterally   Abdominal:     Palpations: Abdomen is soft.  Tenderness: Non tender and nondistended   Musculoskeletal:        General: No swelling or tenderness.   Skin:    Comments: No lesions or rashes   Neurological:     General: grossly non focal,awake, alert,oriented to name and place but not to situation   Psychiatric:        Mood and Affect: random speech , incoherent   Pertinent Microbiology Results for orders placed or performed during the hospital encounter of 01/28/21  Resp Panel by RT-PCR (Flu A&B, Covid) Nasopharyngeal Swab     Status: None   Collection Time:  01/28/21 11:54 AM   Specimen: Nasopharyngeal Swab; Nasopharyngeal(NP) swabs in vial transport medium  Result Value Ref Range Status   SARS Coronavirus 2 by RT PCR NEGATIVE NEGATIVE Final    Comment: (NOTE) SARS-CoV-2 target nucleic acids are NOT DETECTED.  The SARS-CoV-2 RNA is generally detectable in upper respiratory specimens during the acute phase of infection. The lowest concentration of SARS-CoV-2 viral copies this assay can detect is 138 copies/mL. A negative result does not preclude SARS-Cov-2 infection and should not be used as the sole basis for treatment or other patient management decisions. A negative result may occur with  improper specimen collection/handling, submission of specimen other than nasopharyngeal swab, presence of viral mutation(s) within the areas targeted by this assay, and inadequate number of viral copies(<138 copies/mL). A negative result must be combined with clinical observations, patient history, and epidemiological information. The expected result is Negative.  Fact Sheet for Patients:  BloggerCourse.comhttps://www.fda.gov/media/152166/download  Fact Sheet for Healthcare Providers:  SeriousBroker.ithttps://www.fda.gov/media/152162/download  This test is no t yet approved or cleared by the [place] FDA and  has been authorized for detection and/or diagnosis of SARS-CoV-2 by FDA under an Emergency Use Authorization (EUA). This EUA will remain  in effect (meaning this test can be used) for the duration of the COVID-19 declaration under Section 564(b)(1) of the Act, 21 U.S.C.section 360bbb-3(b)(1), unless the authorization is terminated  or revoked sooner.       Influenza A by PCR NEGATIVE NEGATIVE Final   Influenza B by PCR NEGATIVE NEGATIVE Final    Comment: (NOTE) The Xpert Xpress SARS-CoV-2/FLU/RSV plus assay is intended as an aid in the diagnosis of influenza from Nasopharyngeal swab specimens and should not be used as a sole basis for treatment. Nasal washings  and aspirates are unacceptable for Xpert Xpress SARS-CoV-2/FLU/RSV testing.  Fact Sheet for Patients: BloggerCourse.comhttps://www.fda.gov/media/152166/download  Fact Sheet for Healthcare Providers: SeriousBroker.ithttps://www.fda.gov/media/152162/download  This test is not yet approved or cleared by the [place] FDA and has been authorized for detection and/or diagnosis of SARS-CoV-2 by FDA under an Emergency Use Authorization (EUA). This EUA will remain in effect (meaning this test can be used) for the duration of the COVID-19 declaration under Section 564(b)(1) of the Act, 21 U.S.C. section 360bbb-3(b)(1), unless the authorization is terminated or revoked.  Performed at [facility], [address] [place], [place] [address]   Culture, blood (routine x 2)     Status: None   Collection Time: 01/28/21 12:51 PM   Specimen: BLOOD  Result Value Ref Range Status   Specimen Description BLOOD LEFT ANTECUBITAL  Final   Special Requests   Final    BOTTLES DRAWN AEROBIC ONLY Blood Culture adequate volume   Culture   Final    NO GROWTH 5 DAYS Performed at [facility], [address] [place], [place] [address]    Report Status 02/02/2021 FINAL  Final  Urine Culture     Status: None   Collection Time: 01/28/21 12:51 PM   Specimen: Urine, Catheterized  Result Value Ref Range Status   Specimen Description URINE, CATHETERIZED  Final   Special Requests NONE  Final   Culture   Final    NO GROWTH Performed at [facility], [address], [place], [place] [address]    Report Status 01/30/2021 FINAL  Final  HSV 1/2 Ab IgG/IgM CSF     Status: None   Collection Time: 01/28/21 12:57 PM   Specimen: CSF; Cerebrospinal Fluid  Result Value Ref Range Status   HSV 1/2 Ab, IgM, CSF 0.11 <=0.89 IV Final    Comment: (NOTE) INTERPRETIVE INFORMATION: Herpes Simplex Virus                          Type 1 and/or 2 Antibodies,                          IgM by ELISA, CSF  0.89 IV or Less .......... Negative:  No significant                             level of detectable HSV IgM                             antibody.  0.90 - 1.09 IV ........... Equivocal: Questionable                             presence of IgM antibodies.                             Repeat testing in 10-14 days                             may be helpful.  1.10 IV or Greater ....... Positive: IgM antibody to HSV                             detected, which may indicate a                             current or recent infection.                             However, low levels of IgM                             antibodies may occasionally                             persist for more than 12                             months post-infection. The detection of antibodies to herpes simplex virus in CSF may indicate central nervou s system infection. However, consideration must be given to possible contamination by blood or transfer of serum antibodies across the  blood-brain barrier. Fourfold or greater rise in CSF antibodies to herpes on specimens at least 4 weeks apart are found in 74-94 % of patients with herpes encephalitis. Specificity of the test based on a single CSF testing is not established. Presently PCR is the primary means of establishing a diagnosis of herpes encephalitis. This test was developed and its performance characteristics determined by [facility]. It has not been cleared or approved by the [place] Food and Drug Administration. This test was performed in a CLIA certified laboratory and is intended for clinical purposes.    HSV 1/2 Ab Screen IgG, CSF 0.52 <=0.89 IV Final    Comment: (NOTE) INTERPRETIVE INFORMATION: Herpes Simplex Virus Type 1 and/or 2                    Antibodies, IgG CSF  0.89 IV or Less .......... Negative: No significant                             level of detectable HSV IgG                             antibody.  0.90 - 1.09 IV ........... Equivocal: Questionable                              presence of IgG antibodies.                             Repeat testing in 10-14 days                             may be helpful.  1.10 IV or Greater ....... Positive: IgG antibody to HSV                             detected, which may indicate                             a current or past HSV                             infection. The detection of antibodies to herpes simplex virus in CSF may indicate central nervous system infection. However, consideration must be given to possible contamination by blood or transfer of serum antibodies across the blood-brain barrier. Fourfold or greater rise in CSF antibodies to herpes on specimens at l east 4 weeks apart are found in 74-94 % of patients with herpes encephalitis. Specificity of the test based on a single CSF testing is not established. Presently PCR is the primary means of establishing a diagnosis of herpes encephalitis. This test was developed and its performance characteristics determined by [facility]. It has not been cleared or approved by the [place] Food and Drug Administration. This test was performed in a CLIA certified laboratory and is intended for clinical purposes. **Effective March 10, 2021 009233 HSV 1/2 Ab IgG/IgM CSF will be**  made non-orderable. [facility] offers order code [id number] HSV 1/2  PCR, CSF. For further information, please contact your local  [facility] Representative. Performed At: [facility] [address] [place], [place] [id number] Elinor Parkinson I MD NW:[id number]   CSF culture w Gram Stain     Status: None   Collection Time: 01/28/21  5:24 PM   Specimen: CSF  Result Value Ref Range Status   Specimen Description CSF  Final   Special Requests CSF  Final   Gram Stain NO ORGANISMS SEEN CYTOSPIN SMEAR   Final   Culture   Final    NO GROWTH 3 DAYS Performed at [facility], [address] [place], [place] [address]    Report Status 02/01/2021 FINAL  Final  Culture, blood  (routine x 2)     Status: None   Collection Time: 01/28/21  5:25 PM   Specimen: BLOOD  Result Value Ref Range Status   Specimen Description BLOOD LEFT ANTECUBITAL  Final   Special Requests   Final    BOTTLES DRAWN AEROBIC AND ANAEROBIC Blood Culture results may not be optimal due to an inadequate volume of blood received in culture bottles   Culture   Final    NO GROWTH 5 DAYS Performed at [facility], [address], [place], [place] [address]    Report Status 02/02/2021 FINAL  Final  Culture, Respiratory w Gram Stain (tracheal aspirate)     Status: None   Collection Time: 01/28/21 11:17 PM   Specimen: Tracheal Aspirate; Respiratory  Result Value Ref Range Status   Specimen Description TRACHEAL ASPIRATE  Final   Special Requests Immunocompromised  Final   Gram Stain   Final    FEW SQUAMOUS EPITHELIAL CELLS PRESENT ABUNDANT WBC PRESENT, PREDOMINANTLY PMN FEW GRAM POSITIVE COCCI    Culture   Final    RARE Consistent with normal respiratory flora. No Pseudomonas species isolated Performed at [facility], [address], [place], [place] [address]    Report Status 01/31/2021 FINAL  Final  Culture, blood (routine x 2)     Status: None   Collection Time: 01/29/21  4:01 PM   Specimen: BLOOD  Result Value Ref Range Status   Specimen Description BLOOD LEFT ANTECUBITAL  Final   Special Requests   Final    BOTTLES DRAWN AEROBIC AND ANAEROBIC Blood Culture adequate volume   Culture   Final    NO GROWTH 5 DAYS Performed at [facility], [address] [place], [place] [address]    Report Status 02/03/2021 FINAL  Final  Culture, blood (routine x 2)     Status: None   Collection Time: 01/29/21  4:07 PM   Specimen: BLOOD LEFT HAND  Result Value Ref Range Status   Specimen Description BLOOD LEFT HAND  Final   Special Requests   Final    BOTTLES DRAWN AEROBIC ONLY Blood Culture adequate volume   Culture   Final    NO GROWTH 5 DAYS Performed at [facility], [address] [place], [place] [address]    Report Status 02/03/2021 FINAL  Final  Respiratory (~20 pathogens) panel by PCR     Status: None   Collection Time: 01/29/21  4:17 PM   Specimen: Nasopharyngeal Swab; Respiratory  Result Value Ref Range Status   Adenovirus NOT DETECTED NOT DETECTED Final   Coronavirus 229E NOT DETECTED NOT DETECTED Final    Comment: (NOTE) The Coronavirus on the Respiratory Panel, DOES NOT test for the novel  Coronavirus (2019 nCoV)    Coronavirus HKU1 NOT DETECTED NOT DETECTED Final   Coronavirus NL63 NOT DETECTED NOT DETECTED Final  Coronavirus OC43 NOT DETECTED NOT DETECTED Final   Metapneumovirus NOT DETECTED NOT DETECTED Final   Rhinovirus / Enterovirus NOT DETECTED NOT DETECTED Final   Influenza A NOT DETECTED NOT DETECTED Final   Influenza B NOT DETECTED NOT DETECTED Final   Parainfluenza Virus 1 NOT DETECTED NOT DETECTED Final   Parainfluenza Virus 2 NOT DETECTED NOT DETECTED Final   Parainfluenza Virus 3 NOT DETECTED NOT DETECTED Final   Parainfluenza Virus 4 NOT DETECTED NOT DETECTED Final   Respiratory Syncytial Virus NOT DETECTED NOT DETECTED Final   Bordetella pertussis NOT DETECTED NOT DETECTED Final   Bordetella Parapertussis NOT DETECTED NOT DETECTED Final   Chlamydophila pneumoniae NOT DETECTED NOT DETECTED Final   Mycoplasma pneumoniae NOT DETECTED NOT DETECTED Final    Comment: Performed at [facility], [address] [place], [place] [address]  Blastomyces Antigen     Status: None   Collection Time: 01/29/21  6:30 PM   Specimen: Blood  Result Value Ref Range Status   Blastomyces Antigen None Detected None Detected ng/mL Final    Comment: (NOTE) Results reported as ng/mL in 0.2 - 14.7 ng/mL range Results above the limit of detection but below 0.2 ng/mL are reported as 'Positive, Below the Limit of Quantification' Results above 14.7 ng/mL are reported as 'Positive, Above the Limit of Quantification'    Specimen Type  SERUM  Final    Comment: (NOTE) Performed At: [facility] [address] [place], [place] [id number] Phylis Bougie MD GN:[id number]   Georgana Curio, PCR     Status: None   Collection Time: 01/30/21  3:21 AM   Specimen: Blood  Result Value Ref Range Status   Toxoplasma Gondii, PCR Negative Negative Final    Comment: (NOTE) No Toxoplasma gondii DNA detected. This test was developed and its performance characteristics determined by [facility] [employer] [facility]. It has not been cleared or approved by the U.S. Food and Drug Administration. The FDA has determined that such clearance or approval is not necessary. This test is used for clinical purposes. It should not be regarded as investigational or research. Performed At: [facility] [address] [place], [place] [id number] Jolene Schimke MD WU:[id number]   MRSA Next Gen by PCR, Nasal     Status: Abnormal   Collection Time: 01/30/21  8:22 AM   Specimen: Nasal Mucosa; Nasal Swab  Result Value Ref Range Status   MRSA by PCR Next Gen DETECTED (A) NOT DETECTED Final    Comment: RESULT CALLED TO, READ BACK BY AND VERIFIED WITH: RN J.GRANDOS AT [phone] ON 01/30/2021 BY T.SAAD. (NOTE) The GeneXpert MRSA Assay (FDA approved for NASAL specimens only), is one component of a comprehensive MRSA colonization surveillance program. It is not intended to diagnose MRSA infection nor to guide or monitor treatment for MRSA infections. Test performance is not FDA approved in patients less than 70 years old. Performed at [facility], [address] [place], [place] [address]     Pertinent Lab. CBC Latest Ref Rng & Units 02/04/2021 02/03/2021 02/01/2021  WBC 4.0 - 10.5 K/uL 8.2 9.2 7.4  Hemoglobin 13.0 - 17.0 g/dL 11.8(L) 11.2(L) 10.0(L)  Hematocrit 39.0 - 52.0 % 35.6(L) 33.7(L) 31.6(L)  Platelets 150 - 400 K/uL 239 175 120(L)   CMP Latest Ref Rng & Units 02/04/2021 02/03/2021 02/02/2021  Glucose 70 - 99 mg/dL 97 98 91  BUN 6  - 20 mg/dL Creatinine 0.61 - 1.24 mg/dL 4.74 2.59 5.63  Sodium 135 - 145 mmol/L 140 137 137  Potassium 3.5 - 5.1 mmol/L 3.9 4.0 3.7  Chloride 98 - 111 mmol/L 109 105 102  CO2 22 - 32 mmol/L 24 21(L) 23  Calcium 8.9 - 10.3 mg/dL 9.1(Y) 7.8(G) 9.5(A)  Total Protein 6.5 - 8.1 g/dL 6.1(L) 6.2(L) 5.8(L)  Total Bilirubin 0.3 - 1.2 mg/dL 0.8 2.1(H) 0.8(M)  Alkaline Phos 38 - 126 U/L 88 93 105  AST 15 - 41 U/L 134(H) 223(H) 365(H)  ALT 0 - 44 U/L 273(H) 359(H) 474(H)     Pertinent Imaging today Plain films and CT images have been personally visualized and interpreted; radiology reports have been reviewed. Decision making incorporated into the Impression / Recommendations.  I spent more than 35 minutes for this patient encounter including review of prior medical records, coordination of care  with greater than 50% of time being face to face/counseling and discussing diagnostics/treatment plan with the patient/family.  Electronically signed by:   Odette Fraction, MD Infectious Disease Physician [facility] for Infectious Disease Pager: [phone]

## 2021-02-05 ENCOUNTER — Inpatient Hospital Stay (HOSPITAL_COMMUNITY): Payer: No Typology Code available for payment source

## 2021-02-05 DIAGNOSIS — I38 Endocarditis, valve unspecified: Secondary | ICD-10-CM

## 2021-02-05 LAB — ECHOCARDIOGRAM LIMITED
Height: 68 in
Single Plane A4C EF: 66.3 %
Weight: 2497.37 oz

## 2021-02-05 LAB — COMPREHENSIVE METABOLIC PANEL
ALT: 210 U/L — ABNORMAL HIGH (ref 0–44)
AST: 86 U/L — ABNORMAL HIGH (ref 15–41)
Albumin: 3 g/dL — ABNORMAL LOW (ref 3.5–5.0)
Alkaline Phosphatase: 89 U/L (ref 38–126)
Anion gap: 8 (ref 5–15)
BUN: 13 mg/dL (ref 6–20)
CO2: 22 mmol/L (ref 22–32)
Calcium: 9 mg/dL (ref 8.9–10.3)
Chloride: 108 mmol/L (ref 98–111)
Creatinine, Ser: 0.98 mg/dL (ref 0.61–1.24)
GFR, Estimated: >60 mL/min (ref >60)
Glucose, Bld: 91 mg/dL (ref 70–99)
Potassium: 3.7 mmol/L (ref 3.5–5.1)
Sodium: 138 mmol/L (ref 135–145)
Total Bilirubin: 1 mg/dL (ref 0.3–1.2)
Total Protein: 6 g/dL — ABNORMAL LOW (ref 6.5–8.1)

## 2021-02-05 LAB — CBC
HCT: 33.8 % — ABNORMAL LOW (ref 39.0–52.0)
Hemoglobin: 11 g/dL — ABNORMAL LOW (ref 13.0–17.0)
MCH: 26.6 pg (ref 26.0–34.0)
MCHC: 32.5 g/dL (ref 30.0–36.0)
MCV: 81.8 fL (ref 80.0–100.0)
Platelets: 272 10*3/uL (ref 150–400)
RBC: 4.13 MIL/uL — ABNORMAL LOW (ref 4.22–5.81)
RDW: 14.1 % (ref 11.5–15.5)
WBC: 7.4 10*3/uL (ref 4.0–10.5)
nRBC: 0 % (ref 0.0–0.2)

## 2021-02-05 LAB — FUNGITELL, SERUM: Fungitell Result: <31 pg/mL (ref <80)

## 2021-02-05 NOTE — Progress Notes (Signed)
ID Brief Note    Repeat TTE on 9/7 with no concerns for vegetation/valve thickening and endocarditis Will DC abtx No need to get a TEE from ID standpoint, very low clinical suspicion for endocarditis overall.  Continue Bilktarvy. Would need a 30 day supply when ready for discharge, [facility] Follow up with Pulmonology regarding EBUS/biopsy ID will sign off. Please call with questions.   Odette Fraction, MD Infectious Disease Physician [facility] for Infectious Disease [address] [place], [place] [address] Phone: [phone]  Fax: [phone] '

## 2021-02-05 NOTE — Progress Notes (Signed)
CSW spoke to [facility] and received following information: Pt is seen at [facility], PCP is Dr Garwin Brothers, social work contact: Farris Has [phone].  Pt has one listed contact, his mother, Davis Gourd, [phone].  No other contacts listed. Daleen Squibb, MSW, LCSW 9/7/20224:03 PM

## 2021-02-05 NOTE — Progress Notes (Signed)
Occupational Therapy Treatment Patient Details Name: Howard Rowland MRN: [id number] DOB: [birth year] Today's Date: 02/05/2021    History of present illness [name] with PTSD, MDD, HIV BIBEMS on 8/30 after being found unresponsive by bystander. He was severely hypotensive on evaluation by EMS. On ED arrival noted to have seizure activity and he was intubated for airway protection in setting of his encephalopathy.ARF, concern for rhabdo, acute encephalopathy. Extubated 9/2. 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   OT comments  Patient in bed attempting to get to eob.  OTA was able to get patient to eob with max assist.  CNA assisted OTA with standing at rw with mod assist +2 and tolerated approximately 1 minute.  Patient performed 2nd stand for less time and was returned to bed with max assist +2 due to fatigue.  Acute OT to continue to follow.   Follow Up Recommendations  CIR;Supervision/Assistance - 24 hour    Equipment Recommendations       Recommendations for Other Services Rehab consult    Precautions / Restrictions Precautions Precautions: Fall Precaution Comments: 4 pt restraints       Mobility Bed Mobility Overal bed mobility: Needs Assistance Bed Mobility: Supine to Sit;Sit to Supine     Supine to sit: Max assist;+2 for physical assistance Sit to supine: Max assist;+2 for physical assistance   General bed mobility comments: max +2 for trunk and LE management, scooting to/from EOB.    Transfers       Sit to Stand: Mod assist;+2 physical assistance         General transfer comment: Stood from eob with rw    Balance Overall balance assessment: Needs assistance Sitting-balance support: No upper extremity supported;Feet supported Sitting balance-Leahy Scale: Fair Sitting balance - Comments: intially had left lateral and posterior leaning but improved with extra time Postural control: Left lateral lean;Posterior lean Standing  balance support: Bilateral upper extremity supported Standing balance-Leahy Scale: Poor Standing balance comment: Assistance of 2, CNA assisted, with mod assist +2 for balance                           ADL either performed or assessed with clinical judgement   ADL Overall ADL's : Needs assistance/impaired                                       General ADL Comments: Attempted to have patient wash face but patient refused task     Vision       Perception     Praxis      Cognition Arousal/Alertness: Lethargic Behavior During Therapy: Anxious;Flat affect Overall Cognitive Status: No family/caregiver present to determine baseline cognitive functioning Area of Impairment: Orientation;Attention;Following commands;Safety/judgement;Awareness;Problem solving                 Orientation Level: Disoriented to;Situation Current Attention Level: Focused   Following Commands: Follows one step commands inconsistently;Follows one step commands with increased time Safety/Judgement: Decreased awareness of safety;Decreased awareness of deficits Awareness: Intellectual Problem Solving: Slow processing;Difficulty sequencing;Decreased initiation;Requires verbal cues;Requires tactile cues General Comments: Patient was eager to get up, was attempting to get eob when I arrived        Exercises     Shoulder Instructions       General Comments      Pertinent Vitals/ Pain  Pain Assessment: Faces Pain Score: 4  Faces Pain Scale: Hurts even more Pain Location: RLE, thigh/hip Pain Descriptors / Indicators: Grimacing;Moaning Pain Intervention(s): Monitored during session;Repositioned  Home Living                                          Prior Functioning/Environment              Frequency  Min 2X/week        Progress Toward Goals  OT Goals(current goals can now be found in the care plan section)  Progress towards OT  goals: Progressing toward goals  Acute Rehab OT Goals Patient Stated Goal: unable OT Goal Formulation: Patient unable to participate in goal setting Time For Goal Achievement: 02/16/21 Potential to Achieve Goals: Fair ADL Goals Pt Will Perform Eating: with min assist Pt Will Perform Grooming: with min assist Pt Will Perform Upper Body Bathing: with mod assist Pt Will Transfer to Toilet: with min assist;stand pivot transfer;bedside commode Pt/caregiver will Perform Home Exercise Program: Increased ROM;Increased strength;Both right and left upper extremity Additional ADL Goal #1: Pt will be able to roll with min A to A with basic ADLs Additional ADL Goal #2: Continue to assess vision Additional ADL Goal #3: Pt will be able to maintain EOB balance with min A as a precursor to ADLs at [facility]  Plan Discharge plan remains appropriate    Co-evaluation                 AM-PAC OT "6 Clicks" Daily Activity     Outcome Measure   Help from another person eating meals?: Total Help from another person taking care of personal grooming?: Total Help from another person toileting, which includes using toliet, bedpan, or urinal?: Total Help from another person bathing (including washing, rinsing, drying)?: Total Help from another person to put on and taking off regular upper body clothing?: Total Help from another person to put on and taking off regular lower body clothing?: Total 6 Click Score: 6    End of Session Equipment Utilized During Treatment: Rolling walker  OT Visit Diagnosis: Unsteadiness on feet (R26.81);Other abnormalities of gait and mobility (R26.89);Muscle weakness (generalized) (M62.81);Other symptoms and signs involving cognitive function;Pain Pain - Right/Left: Right Pain - part of body: Hip   Activity Tolerance Patient limited by lethargy   Patient Left in bed;with call bell/phone within reach;with bed alarm set;with restraints reapplied   Nurse Communication Mobility  status        Time: 2130-8657 OT Time Calculation (min): 16 min  Charges: OT General Charges $OT Visit: 1 Visit OT Treatments $Therapeutic Activity: 8-22 mins  Alfonse Flavors, OTA    Howard Rowland 02/05/2021, 10:07 AM

## 2021-02-05 NOTE — Progress Notes (Signed)
PROGRESS NOTE    Howard Rowland  ZOX:[id number] DOB: Nov 13, 1990 DOA: 01/28/2021 PCP: Pcp, No   Brief Narrative:  The patient is a 30 year old male with a past medical history significant for but not limited to HIV admitted after he was found down in a hotel room presumed narcotic overdose.  Has had many recent visits to the ER with behavioral health involvement regarding depression.  He was admitted to ICU and developed acute respiratory failure due to aspiration pneumonia.  He is also noted to have a pneumothorax after central line was placed.  He had chest tube placed and was stabilized and transferred to the floor.  He continues to have altered mentation and there is also concern for questionable endocarditis.  Current significant events and procedures are as follows:  Significant events: 8/30 admitted after being found unresponsive 8/31 left pneumothorax 9/02 extubated, off CRRT 9/03 Lt chest tubed and CVL removed 9/04 transfer to progressive care   Procedures: 8/30 ETT > 9/02 8/30 R IJ HD cath > 9/03 8/30 L [place] CVL > 8/31 8/30 R femoral arterial line > 9/02 8/31 L IJ CVL > 9/3 8/31 L chest tube > 9/03   Studies: 8/30 CT angiogram chest > no PE, bilateral lower lobe infiltrates, considerable bilateral hilar and mediastinal adenopathy 8/31 TTE >LVEF 40-45%, dyskinesis of the left ventricular, basal inferolateral wall, mitral valve regurgitation and thickening, consider TEE, aortic valve with mild sclerosis 9/01 CT chest >> improved aeration at Rt lung base and LLL, decreased adenopathy, small volume Lt PTX with chest tube in place, mild subcutaneous emphysema 9/01 MRI brain >> 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  No seizure or epileptiform activity noted. 9/7 repeat TEE done and showed EF of 60-65% with undeterminable LVDF. No evidence of valvular vegetations on  this transthoracic echocardiogram. Would  recommend a  transesophageal echocardiogram to exclude infective endocarditis if clinically indicated.  Patient still continues to remain confused and does not appropriately answer questions.  Today when I talked to him he just started feeling to different dates.  Thought he was going to the barbershop.  No other concerns and nephrology, neurology and infectious disease as well as critical care been involved in patient's care.  Assessment & Plan:   Active Problems:   Respiratory failure (HCC)   Shock (HCC)   Status epilepticus (HCC)   Pneumothorax, left   HIV disease (HCC)   Altered mental status  Acute metabolic encephalopathy from hypoxia in the setting of accidental drug overdose -Patient was seen by neurology.  Underwent MRI and EEG.   -Patient apparently had seizure activity after respiratory arrest.   -EEG has not shown any epileptiform activity.  Patient however remains on Keppra. -Apparently serum paraneoplastic panel was sent out to [facility] on 9/1.  These results are also pending. -Remains confused distracted and impulsive.  Noted to be in restraints.  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   -Underwent a lumbar puncture due to his acute confusion.   -Was placed on broad-spectrum antibiotics.   -Currently he is noted to be on vancomycin and Unasyn.  Also noted to be on acyclovir.  CSF HSV IgM noted to be negative.   -Blood cultures were negative however transthoracic echocardiogram raise concern for mitral valve thickening.  Discussed with infectious disease who would prefer to have a TEE to determine  plan regarding antibiotics.   -Patient remains encephalopathic though it appears that he has been making some improvement.  We will wait for his mentation improves some more before pursuing TEE.  At his current mentation he will be unable to provide  informed consent.  There is no next of kin or healthcare proxy available for decision-making.  In view of this and due to concern for endocarditis ID recommendation is to do 6 weeks of antibiotics with vancomycin and ceftriaxone.  Start date will be 8/30; social worker was able to track down patient's mother so we will need to discuss with her about obtaining a TEE however he did have a repeat TTE on 12/08/2020 with no concerns for vegetation bacteremia and endocarditis -ID now recommends discontinuing antibiotics and no need to get a TEE given the very low clinical suspicion for active colitis -They are recommending continuing Biktarvy and get a 30-day supply for discharge [facility] -ID has cleared the patient for PICC line placement.   Aspiration pneumonia in the setting of HIV -Unasyn was initiated on August 31.  Has been changed over to ceftriaxone and now antibiotics have been discontinued.   -SpO2: 99 % O2 Flow Rate (L/min): 4 L/min FiO2 (%): 40 %; Respiratory status is stable.  No longer on oxygen.      Acute respiratory failure with hypoxia/pneumothorax -Developed left-sided pneumothorax after central line was placed.  Had a chest tube In ICU.  This has been removed.   -Respiratory status noted to be stable as above   History of depression/PTSD Apparently on Remeron and Lyrica prior to admission.  Resumed given his impulsiveness.  Consulted and psychiatry given that he remains in restraints and became combative today  Mediastinal adenopathy Significance of this is unclear.  Might be reactive per pulmonology.  They recommend repeat imaging in a few weeks and then to determine if bronchoscopy with EBUS is needed but they do feel that he does need a EBUS to guide mediastinal fine-needle aspiration and patient wants to do it in the outpatient setting with GI.  PCCM feels that this is not some medically pressing to obtain currently   History of  HIV -Remains on antiretroviral treatment.  Infectious disease has been following.  CD4 was 707 on 8/30.  Antiretroviral treatment modified by infectious disease and they are recommending discharging on Biktarvy.   Abnormal LFTs -Thought to be secondary to shock as well as acute illness.  LFTs improving gradually with AST being 86 and ALT now being 210.   -Continue to monitor and trend hepatic function panel and repeat intermittently   Acute kidney injury, resolved -Thought to be secondary to rhabdomyolysis and ATN in the setting of shock.   -Patient was on CRRT in the ICU.   -A dialysis catheter was placed.  Nephrology was following.  Urine output was monitored.  Renal function has improved.   -Dialysis was discontinued.  Dialysis catheter was removed.  Nephrology has signed off. -Renal function has normalized his last BUN/creatinine being 13/0.9.  Monitor urine output. -Avoid further nephrotoxic medications, contrast dyes, hypotension and renally adjust medications -Repeat CMP in a.m.  Intermittent urinary retention -Patient was noted to be retaining urine.  Required catheterization.  -Bladder scans every shift.  -C/w Tamsulosins 0.4 mg po Daily.  Anemia of critical illness/mild thrombocytopenia -Platelet counts appear to have improved.  Hemoglobin noted to be stable. -Patient's platelet count is now 272 and his hemoglobin/hematocrit is stable  at 11.0/33.8 -Continue to monitor for signs and symptoms bleeding; currently no overt bleeding noted  Dysphagia -Seen by speech therapy.  Cleared for regular diet.   Deconditioning -PT and OT evaluation and they are recommending SNF currently and CIR can be reconsulted once we identify caregiver support and once the patient is more participatory and improved   DVT prophylaxis: Heparin 5,000 units sq q8h Code Status: FULL CODE  Family Communication: No family present at bedside  Disposition Plan: SNF vs CIR once patient improves  Status is:  Inpatient  Remains inpatient appropriate because:Unsafe d/c plan, IV treatments appropriate due to intensity of illness or inability to take PO, and Inpatient level of care appropriate due to severity of illness  Dispo:  Patient From: Home  Planned Disposition: Inpatient Rehab  Medically stable for discharge: No    Consultants:  PCCM Nephrology ID Neurology  Psychiatry   Procedures: As above  Antimicrobials:  Anti-infectives (From admission, onward)    Start     Dose/Rate Route Frequency Ordered Stop   02/04/21 1030  vancomycin (VANCOREADY) IVPB 750 mg/150 mL  Status:  Discontinued        750 mg 150 mL/hr over 60 Minutes Intravenous Every 8 hours 02/04/21 0930 02/05/21 1025   02/04/21 1030  cefTRIAXone (ROCEPHIN) 2 g in sodium chloride 0.9 % 100 mL IVPB  Status:  Discontinued        2 g 200 mL/hr over 30 Minutes Intravenous Every 24 hours 02/04/21 0930 02/05/21 1025   02/04/21 1015  bictegravir-emtricitabine-tenofovir AF (BIKTARVY) 50-200-25 MG per tablet 1 tablet        1 tablet Oral Daily 02/04/21 0915     02/03/21 0800  vancomycin (VANCOREADY) IVPB 500 mg/100 mL  Status:  Discontinued        500 mg 100 mL/hr over 60 Minutes Intravenous Every 8 hours 02/03/21 0444 02/03/21 0444   02/03/21 0800  vancomycin (VANCOREADY) IVPB 750 mg/150 mL  Status:  Discontinued        750 mg 150 mL/hr over 60 Minutes Intravenous Every 8 hours 02/03/21 0444 02/04/21 0914   02/03/21 0630  acyclovir (ZOVIRAX) 700 mg in dextrose 5 % 100 mL IVPB  Status:  Discontinued        700 mg 114 mL/hr over 60 Minutes Intravenous Every 8 hours 02/03/21 0458 02/04/21 0914   01/31/21 1600  Ampicillin-Sulbactam (UNASYN) 3 g in sodium chloride 0.9 % 100 mL IVPB  Status:  Discontinued        3 g 200 mL/hr over 30 Minutes Intravenous Every 8 hours 01/31/21 0918 01/31/21 1208   01/31/21 1500  vancomycin (VANCOREADY) IVPB 500 mg/100 mL  Status:  Discontinued        500 mg 100 mL/hr over 60 Minutes Intravenous  Every 12 hours 01/31/21 1410 02/03/21 0443   01/31/21 1300  Ampicillin-Sulbactam (UNASYN) 3 g in sodium chloride 0.9 % 100 mL IVPB  Status:  Discontinued        3 g 200 mL/hr over 30 Minutes Intravenous Every 6 hours 01/31/21 1208 02/04/21 0914   01/31/21 1000  dolutegravir (TIVICAY) tablet 50 mg  Status:  Discontinued        50 mg Oral Daily 01/31/21 0804 02/04/21 0915   01/31/21 1000  emtricitabine-tenofovir AF (DESCOVY) 200-25 MG per tablet 1 tablet  Status:  Discontinued        1 tablet Oral Daily 01/31/21 0804 02/04/21 0915   01/30/21 1845  dolutegravir (TIVICAY) tablet 50 mg  Status:  Discontinued        50 mg Per Tube Daily 01/30/21 1748 01/31/21 0804   01/30/21 1845  emtricitabine-tenofovir AF (DESCOVY) 200-25 MG per tablet 1 tablet  Status:  Discontinued        1 tablet Per Tube Daily 01/30/21 1748 01/31/21 0804   01/29/21 1800  acyclovir (ZOVIRAX) 670 mg in dextrose 5 % 100 mL IVPB  Status:  Discontinued        10 mg/kg  67.1 kg 113.4 mL/hr over 60 Minutes Intravenous Every 24 hours 01/28/21 2150 02/03/21 0457   01/29/21 1800  vancomycin (VANCOREADY) IVPB 750 mg/150 mL  Status:  Discontinued        750 mg 150 mL/hr over 60 Minutes Intravenous Every 24 hours 01/28/21 2150 01/31/21 1410   01/29/21 1600  Ampicillin-Sulbactam (UNASYN) 3 g in sodium chloride 0.9 % 100 mL IVPB  Status:  Discontinued        3 g 200 mL/hr over 30 Minutes Intravenous Every 8 hours 01/29/21 1443 01/31/21 0918   01/29/21 1545  dolutegravir (TIVICAY) tablet 50 mg  Status:  Discontinued        50 mg Per Tube Daily 01/29/21 1445 01/30/21 0720   01/29/21 1545  emtricitabine-tenofovir AF (DESCOVY) 200-25 MG per tablet 1 tablet  Status:  Discontinued        1 tablet Per Tube Daily 01/29/21 1445 01/30/21 0720   01/29/21 0300  ceFEPIme (MAXIPIME) 2 g in sodium chloride 0.9 % 100 mL IVPB  Status:  Discontinued        2 g 200 mL/hr over 30 Minutes Intravenous Every 12 hours 01/28/21 1427 01/28/21 1432   01/28/21  2000  ceFEPIme (MAXIPIME) 2 g in sodium chloride 0.9 % 100 mL IVPB  Status:  Discontinued        2 g 200 mL/hr over 30 Minutes Intravenous Every 12 hours 01/28/21 1749 01/29/21 1407   01/28/21 1830  vancomycin (VANCOREADY) IVPB 1500 mg/300 mL        1,500 mg 150 mL/hr over 120 Minutes Intravenous  Once 01/28/21 1732 01/28/21 2022   01/28/21 1500  ceFEPIme (MAXIPIME) 2 g in sodium chloride 0.9 % 100 mL IVPB  Status:  Discontinued        2 g 200 mL/hr over 30 Minutes Intravenous Every 12 hours 01/28/21 1432 01/28/21 1749   01/28/21 1422  vancomycin variable dose per unstable renal function (pharmacist dosing)  Status:  Discontinued         Does not apply See admin instructions 01/28/21 1422 01/28/21 1732   01/28/21 1400  acyclovir (ZOVIRAX) 670 mg in dextrose 5 % 100 mL IVPB  Status:  Discontinued        10 mg/kg  67.1 kg 113.4 mL/hr over 60 Minutes Intravenous Every 12 hours 01/28/21 1338 01/28/21 2150   01/28/21 1300  ceFEPIme (MAXIPIME) 2 g in sodium chloride 0.9 % 100 mL IVPB  Status:  Discontinued        2 g 200 mL/hr over 30 Minutes Intravenous  Once 01/28/21 1253 01/28/21 1431   01/28/21 1300  ampicillin (OMNIPEN) 1 g in sodium chloride 0.9 % 100 mL IVPB  Status:  Discontinued        1 g 300 mL/hr over 20 Minutes Intravenous  Once 01/28/21 1255 01/28/21 1423   01/28/21 1200  cefTRIAXone (ROCEPHIN) 2 g in sodium chloride 0.9 % 100 mL IVPB  Status:  Discontinued        2 g 200 mL/hr over  30 Minutes Intravenous  Once 01/28/21 1158 01/28/21 1253   01/28/21 1200  vancomycin (VANCOREADY) IVPB 1500 mg/300 mL  Status:  Discontinued        1,500 mg 150 mL/hr over 120 Minutes Intravenous  Once 01/28/21 1158 01/28/21 1732        Subjective: Seen and examined at bedside he still very confused.  Became agitated with the nurse and tried to kick her.  We will continue restraints and have consulted psych for his behavior.  Mentation is still not back to his baseline.  Objective: Vitals:    02/05/21 0758 02/05/21 0828 02/05/21 1351 02/05/21 1632  BP: (!) 137/103 135/83 134/85 137/84  Pulse: 75 76 84 84  Resp: 20  19 20   Temp: 98.6 F (37 C)  98.4 F (36.9 C) 98.6 F (37 C)  TempSrc: Oral  Oral Oral  SpO2: 98%   99%  Weight:      Height:        Intake/Output Summary (Last 24 hours) at 02/05/2021 1712 Last data filed at 02/05/2021 1216 Gross per 24 hour  Intake 1755.36 ml  Output 1600 ml  Net 155.36 ml   Filed Weights   02/01/21 0426 02/02/21 0500 02/02/21 1700  Weight: 68 kg 66.7 kg 70.8 kg   Examination: Physical Exam:  Constitutional: WN/WD African-American male currently in NAD and appears calm but still very confused Eyes: Lids and conjunctivae normal, sclerae anicteric  ENMT: External Ears, Nose appear normal. Grossly normal hearing.  Neck: Appears normal, supple, no cervical masses, normal ROM, no appreciable thyromegaly; no JVD Respiratory: Diminished to auscultation bilaterally, no wheezing, rales, rhonchi or crackles. Normal respiratory effort and patient is not tachypenic. No accessory muscle use.  Unlabored breathing Cardiovascular: RRR, no murmurs / rubs / gallops.. No extremity edema. Abdomen: Soft, non-tender, non-distended.  Bowel sounds positive.  GU: Deferred. Musculoskeletal: No clubbing / cyanosis of digits/nails. No joint deformity upper and lower extremities.  Skin: No rashes, lesions, ulcers on limited skin evaluation and. No induration; Warm and dry.  Neurologic: CN 2-12 grossly intact with no focal deficits. Romberg sign and cerebellar reflexes not assessed.  Psychiatric: Impaired judgment and insight.  He is awake but he is not fully alert and oriented x 3. Normal mood and a flat affect.   Data Reviewed: I have personally reviewed following labs and imaging studies  CBC: Recent Labs  Lab 01/31/21 0515 02/01/21 0516 02/03/21 0345 02/04/21 0038 02/05/21 0136  WBC 8.8 7.4 9.2 8.2 7.4  NEUTROABS 6.1  --   --   --   --   HGB 10.1*  10.0* 11.2* 11.8* 11.0*  HCT 30.4* 31.6* 33.7* 35.6* 33.8*  MCV 81.3 84.9 81.0 81.1 81.8  PLT 120* 120* 175 239 272   Basic Metabolic Panel: Recent Labs  Lab 01/30/21 0321 01/30/21 1556 01/31/21 0515 02/01/21 0516 02/02/21 0633 02/03/21 0345 02/04/21 0038 02/05/21 0136  NA 135 142 139 139 137 137 140 138  K 3.2* 3.1* 3.0* 3.8 3.7 4.0 3.9 3.7  CL 102 110 108 105 102 105 109 108  CO2 24 24 23 28 23  21* 24 22  GLUCOSE 156* 126* 116* 80 91 98 97 91  BUN 20 15 15 11 13 13 8 13   CREATININE 1.09 0.91 0.89 1.06 1.01 0.98 1.00 0.98  CALCIUM 7.9* 7.6* 8.0* 8.0* 8.5* 8.5* 8.7* 9.0  MG 2.4  --  2.2 2.1  --   --   --   --   PHOS 1.7*  1.6* 1.2* 3.4  --   --   --   --    GFR: Estimated Creatinine Clearance: 108.6 mL/min (by C-G formula based on SCr of 0.98 mg/dL). Liver Function Tests: Recent Labs  Lab 01/31/21 0515 02/01/21 0516 02/02/21 0633 02/03/21 0345 02/04/21 0038 02/05/21 0136  AST 560*  --  365* 223* 134* 86*  ALT 748*  --  474* 359* 273* 210*  ALKPHOS 71  --  105 93 88 89  BILITOT 1.1  --  1.6* 1.4* 0.8 1.0  PROT 5.5*  --  5.8* 6.2* 6.1* 6.0*  ALBUMIN 2.9* 2.7* 2.8* 3.0* 2.9* 3.0*   No results for input(s): LIPASE, AMYLASE in the last 168 hours. No results for input(s): AMMONIA in the last 168 hours. Coagulation Profile: No results for input(s): INR, PROTIME in the last 168 hours. Cardiac Enzymes: No results for input(s): CKTOTAL, CKMB, CKMBINDEX, TROPONINI in the last 168 hours. BNP (last 3 results) No results for input(s): PROBNP in the last 8760 hours. HbA1C: No results for input(s): HGBA1C in the last 72 hours. CBG: Recent Labs  Lab 02/01/21 2306 02/02/21 0308 02/02/21 0707 02/02/21 1224 02/02/21 1639  GLUCAP 70 73 70 97 100*   Lipid Profile: No results for input(s): CHOL, HDL, LDLCALC, TRIG, CHOLHDL, LDLDIRECT in the last 72 hours. Thyroid Function Tests: No results for input(s): TSH, T4TOTAL, FREET4, T3FREE, THYROIDAB in the last 72 hours. Anemia  Panel: No results for input(s): VITAMINB12, FOLATE, FERRITIN, TIBC, IRON, RETICCTPCT in the last 72 hours. Sepsis Labs: Recent Labs  Lab 01/30/21 2133  LATICACIDVEN 1.1    Recent Results (from the past 240 hour(s))  Resp Panel by RT-PCR (Flu A&B, Covid) Nasopharyngeal Swab     Status: None   Collection Time: 01/28/21 11:54 AM   Specimen: Nasopharyngeal Swab; Nasopharyngeal(NP) swabs in vial transport medium  Result Value Ref Range Status   SARS Coronavirus 2 by RT PCR NEGATIVE NEGATIVE Final    Comment: (NOTE) SARS-CoV-2 target nucleic acids are NOT DETECTED.  The SARS-CoV-2 RNA is generally detectable in upper respiratory specimens during the acute phase of infection. The lowest concentration of SARS-CoV-2 viral copies this assay can detect is 138 copies/mL. A negative result does not preclude SARS-Cov-2 infection and should not be used as the sole basis for treatment or other patient management decisions. A negative result may occur with  improper specimen collection/handling, submission of specimen other than nasopharyngeal swab, presence of viral mutation(s) within the areas targeted by this assay, and inadequate number of viral copies(<138 copies/mL). A negative result must be combined with clinical observations, patient history, and epidemiological information. The expected result is Negative.  Fact Sheet for Patients:  BloggerCourse.comhttps://www.fda.gov/media/152166/download  Fact Sheet for Healthcare Providers:  SeriousBroker.ithttps://www.fda.gov/media/152162/download  This test is no t yet approved or cleared by the [place] FDA and  has been authorized for detection and/or diagnosis of SARS-CoV-2 by FDA under an Emergency Use Authorization (EUA). This EUA will remain  in effect (meaning this test can be used) for the duration of the COVID-19 declaration under Section 564(b)(1) of the Act, 21 U.S.C.section 360bbb-3(b)(1), unless the authorization is terminated  or revoked sooner.        Influenza A by PCR NEGATIVE NEGATIVE Final   Influenza B by PCR NEGATIVE NEGATIVE Final    Comment: (NOTE) The Xpert Xpress SARS-CoV-2/FLU/RSV plus assay is intended as an aid in the diagnosis of influenza from Nasopharyngeal swab specimens and should not be used as a sole basis for treatment.  Nasal washings and aspirates are unacceptable for Xpert Xpress SARS-CoV-2/FLU/RSV testing.  Fact Sheet for Patients: BloggerCourse.com  Fact Sheet for Healthcare Providers: SeriousBroker.it  This test is not yet approved or cleared by the [place] FDA and has been authorized for detection and/or diagnosis of SARS-CoV-2 by FDA under an Emergency Use Authorization (EUA). This EUA will remain in effect (meaning this test can be used) for the duration of the COVID-19 declaration under Section 564(b)(1) of the Act, 21 U.S.C. section 360bbb-3(b)(1), unless the authorization is terminated or revoked.  Performed at [facility], [address] [place], [place] [address]   Culture, blood (routine x 2)     Status: None   Collection Time: 01/28/21 12:51 PM   Specimen: BLOOD  Result Value Ref Range Status   Specimen Description BLOOD LEFT ANTECUBITAL  Final   Special Requests   Final    BOTTLES DRAWN AEROBIC ONLY Blood Culture adequate volume   Culture   Final    NO GROWTH 5 DAYS Performed at [facility], [address] [place], [place] [address]    Report Status 02/02/2021 FINAL  Final  Urine Culture     Status: None   Collection Time: 01/28/21 12:51 PM   Specimen: Urine, Catheterized  Result Value Ref Range Status   Specimen Description URINE, CATHETERIZED  Final   Special Requests NONE  Final   Culture   Final    NO GROWTH Performed at [facility], [address], [place], [place] [address]    Report Status 01/30/2021 FINAL  Final  HSV 1/2 Ab IgG/IgM CSF     Status: None   Collection Time: 01/28/21 12:57 PM    Specimen: CSF; Cerebrospinal Fluid  Result Value Ref Range Status   HSV 1/2 Ab, IgM, CSF 0.11 <=0.89 IV Final    Comment: (NOTE) INTERPRETIVE INFORMATION: Herpes Simplex Virus                          Type 1 and/or 2 Antibodies,                          IgM by ELISA, CSF  0.89 IV or Less .......... Negative: No significant                             level of detectable HSV IgM                             antibody.  0.90 - 1.09 IV ........... Equivocal: Questionable                             presence of IgM antibodies.                             Repeat testing in 10-14 days                             may be helpful.  1.10 IV or Greater ....... Positive: IgM antibody to HSV                             detected,  which may indicate a                             current or recent infection.                             However, low levels of IgM                             antibodies may occasionally                             persist for more than 12                             months post-infection. The detection of antibodies to herpes simplex virus in CSF may indicate central nervou s system infection. However, consideration must be given to possible contamination by blood or transfer of serum antibodies across the blood-brain barrier. Fourfold or greater rise in CSF antibodies to herpes on specimens at least 4 weeks apart are found in 74-94 % of patients with herpes encephalitis. Specificity of the test based on a single CSF testing is not established. Presently PCR is the primary means of establishing a diagnosis of herpes encephalitis. This test was developed and its performance characteristics determined by [facility]. It has not been cleared or approved by the [place] Food and Drug Administration. This test was performed in a CLIA certified laboratory and is intended for clinical purposes.    HSV 1/2 Ab Screen IgG, CSF 0.52 <=0.89 IV Final    Comment:  (NOTE) INTERPRETIVE INFORMATION: Herpes Simplex Virus Type 1 and/or 2                    Antibodies, IgG CSF  0.89 IV or Less .......... Negative: No significant                             level of detectable HSV IgG                             antibody.  0.90 - 1.09 IV ........... Equivocal: Questionable                             presence of IgG antibodies.                             Repeat testing in 10-14 days                             may be helpful.  1.10 IV or Greater ....... Positive: IgG antibody to HSV                             detected, which may indicate                             a current or past HSV  infection. The detection of antibodies to herpes simplex virus in CSF may indicate central nervous system infection. However, consideration must be given to possible contamination by blood or transfer of serum antibodies across the blood-brain barrier. Fourfold or greater rise in CSF antibodies to herpes on specimens at l east 4 weeks apart are found in 74-94 % of patients with herpes encephalitis. Specificity of the test based on a single CSF testing is not established. Presently PCR is the primary means of establishing a diagnosis of herpes encephalitis. This test was developed and its performance characteristics determined by [employer]. It has not been cleared or approved by the [place] Food and Drug Administration. This test was performed in a CLIA certified laboratory and is intended for clinical purposes. **Effective March 10, 2021 161096 HSV 1/2 Ab IgG/IgM CSF will be**  made non-orderable. [facility] offers order code [id number] HSV 1/2  PCR, CSF. For further information, please contact your local  [facility] Representative. Performed At: [facility] [address] [place], [place] [id number] Elinor Parkinson I MD BJ:[id number]   CSF culture w Gram Stain     Status: None   Collection Time: 01/28/21  5:24 PM   Specimen:  CSF  Result Value Ref Range Status   Specimen Description CSF  Final   Special Requests CSF  Final   Gram Stain NO ORGANISMS SEEN CYTOSPIN SMEAR   Final   Culture   Final    NO GROWTH 3 DAYS Performed at [facility], [address] [place], [place] [address]    Report Status 02/01/2021 FINAL  Final  Culture, blood (routine x 2)     Status: None   Collection Time: 01/28/21  5:25 PM   Specimen: BLOOD  Result Value Ref Range Status   Specimen Description BLOOD LEFT ANTECUBITAL  Final   Special Requests   Final    BOTTLES DRAWN AEROBIC AND ANAEROBIC Blood Culture results may not be optimal due to an inadequate volume of blood received in culture bottles   Culture   Final    NO GROWTH 5 DAYS Performed at [facility], [address] [place], [place] [address]    Report Status 02/02/2021 FINAL  Final  Culture, Respiratory w Gram Stain (tracheal aspirate)     Status: None   Collection Time: 01/28/21 11:17 PM   Specimen: Tracheal Aspirate; Respiratory  Result Value Ref Range Status   Specimen Description TRACHEAL ASPIRATE  Final   Special Requests Immunocompromised  Final   Gram Stain   Final    FEW SQUAMOUS EPITHELIAL CELLS PRESENT ABUNDANT WBC PRESENT, PREDOMINANTLY PMN FEW GRAM POSITIVE COCCI    Culture   Final    RARE Consistent with normal respiratory flora. No Pseudomonas species isolated Performed at [facility], [address], [place], [place] [address]    Report Status 01/31/2021 FINAL  Final  Culture, blood (routine x 2)     Status: None   Collection Time: 01/29/21  4:01 PM   Specimen: BLOOD  Result Value Ref Range Status   Specimen Description BLOOD LEFT ANTECUBITAL  Final   Special Requests   Final    BOTTLES DRAWN AEROBIC AND ANAEROBIC Blood Culture adequate volume   Culture   Final    NO GROWTH 5 DAYS Performed at [facility], [address] [place], [place] [address]    Report Status 02/03/2021 FINAL  Final  Culture, blood  (routine x 2)  Status: None   Collection Time: 01/29/21  4:07 PM   Specimen: BLOOD LEFT HAND  Result Value Ref Range Status   Specimen Description BLOOD LEFT HAND  Final   Special Requests   Final    BOTTLES DRAWN AEROBIC ONLY Blood Culture adequate volume   Culture   Final    NO GROWTH 5 DAYS Performed at [facility], [address] [place], [place] [address]    Report Status 02/03/2021 FINAL  Final  Respiratory (~20 pathogens) panel by PCR     Status: None   Collection Time: 01/29/21  4:17 PM   Specimen: Nasopharyngeal Swab; Respiratory  Result Value Ref Range Status   Adenovirus NOT DETECTED NOT DETECTED Final   Coronavirus 229E NOT DETECTED NOT DETECTED Final    Comment: (NOTE) The Coronavirus on the Respiratory Panel, DOES NOT test for the novel  Coronavirus (2019 nCoV)    Coronavirus HKU1 NOT DETECTED NOT DETECTED Final   Coronavirus NL63 NOT DETECTED NOT DETECTED Final   Coronavirus OC43 NOT DETECTED NOT DETECTED Final   Metapneumovirus NOT DETECTED NOT DETECTED Final   Rhinovirus / Enterovirus NOT DETECTED NOT DETECTED Final   Influenza A NOT DETECTED NOT DETECTED Final   Influenza B NOT DETECTED NOT DETECTED Final   Parainfluenza Virus 1 NOT DETECTED NOT DETECTED Final   Parainfluenza Virus 2 NOT DETECTED NOT DETECTED Final   Parainfluenza Virus 3 NOT DETECTED NOT DETECTED Final   Parainfluenza Virus 4 NOT DETECTED NOT DETECTED Final   Respiratory Syncytial Virus NOT DETECTED NOT DETECTED Final   Bordetella pertussis NOT DETECTED NOT DETECTED Final   Bordetella Parapertussis NOT DETECTED NOT DETECTED Final   Chlamydophila pneumoniae NOT DETECTED NOT DETECTED Final   Mycoplasma pneumoniae NOT DETECTED NOT DETECTED Final    Comment: Performed at [facility], [address], [place], [place] [address]  Blastomyces Antigen     Status: None   Collection Time: 01/29/21  6:30 PM   Specimen: Blood  Result Value Ref Range Status   Blastomyces Antigen None  Detected None Detected ng/mL Final    Comment: (NOTE) Results reported as ng/mL in 0.2 - 14.7 ng/mL range Results above the limit of detection but below 0.2 ng/mL are reported as 'Positive, Below the Limit of Quantification' Results above 14.7 ng/mL are reported as 'Positive, Above the Limit of Quantification'    Specimen Type SERUM  Final    Comment: (NOTE) Performed At: [facility] [address] [place], [place] [id number] Phylis Bougie MD WG:[id number]   Georgana Curio, PCR     Status: None   Collection Time: 01/30/21  3:21 AM   Specimen: Blood  Result Value Ref Range Status   Toxoplasma Gondii, PCR Negative Negative Final    Comment: (NOTE) No Toxoplasma gondii DNA detected. This test was developed and its performance characteristics determined by [employer]. It has not been cleared or approved by the U.S. Food and Drug Administration. The FDA has determined that such clearance or approval is not necessary. This test is used for clinical purposes. It should not be regarded as investigational or research. Performed At: [facility] [address] [place], [place] [id number] Jolene Schimke MD MW:[id number]   MRSA Next Gen by PCR, Nasal     Status: Abnormal   Collection Time: 01/30/21  8:22 AM   Specimen: Nasal Mucosa; Nasal Swab  Result Value Ref Range Status   MRSA by PCR Next Gen DETECTED (A) NOT DETECTED Final  Comment: RESULT CALLED TO, READ BACK BY AND VERIFIED WITH: RN J.GRANDOS AT [phone] ON 01/30/2021 BY T.SAAD. (NOTE) The GeneXpert MRSA Assay (FDA approved for NASAL specimens only), is one component of a comprehensive MRSA colonization surveillance program. It is not intended to diagnose MRSA infection nor to guide or monitor treatment for MRSA infections. Test performance is not FDA approved in patients less than 96 years old. Performed at [facility], [address], [place], [place] [address]      RN Pressure  Injury Documentation:     Estimated body mass index is 23.73 kg/m as calculated from the following:   Height as of this encounter:  (1.727 m).   Weight as of this encounter: 70.8 kg.  Malnutrition Type:  Nutrition Problem: Increased nutrient needs Etiology: acute illness (Renal failure requiring CRRT)  Malnutrition Characteristics:  Signs/Symptoms: estimated needs  Nutrition Interventions:  Interventions: Tube feeding, Prostat   Radiology Studies: ECHOCARDIOGRAM LIMITED  Result Date: 02/05/2021    ECHOCARDIOGRAM LIMITED REPORT   Patient Name:   MONTGOMERY FAVOR Date of Exam: 02/05/2021 Medical Rec #:  [id number]     Height:       68.0 in Accession #:    [id number]    Weight:       156.1 lb Date of Birth:  12-20-1990     BSA:          1.839 m Patient Age:    30 years      BP:           135/83 mmHg Patient Gender: M             HR:           76 bpm. Exam Location:  Inpatient Procedure: 2D Echo, Limited Echo and Color Doppler Indications:    endocarditis  History:        Patient has prior history of Echocardiogram examinations, most                 recent 01/29/2021.  Sonographer:    MH Referring Phys: [id number] [facility] IMPRESSIONS  1. Left ventricular ejection fraction, by estimation, is 60 to 65%. The left ventricle has normal function. The left ventricle has no regional wall motion abnormalities. Left ventricular diastolic function could not be evaluated.  2. Right ventricular systolic function is normal. The right ventricular size is normal. Tricuspid regurgitation signal is inadequate for assessing PA pressure.  3. The mitral valve is normal in structure. Trivial mitral valve regurgitation. No evidence of mitral stenosis.  4. The aortic valve is normal in structure. Aortic valve regurgitation is not visualized. No aortic stenosis is present.  5. The inferior vena cava is normal in size with greater than 50% respiratory variability, suggesting right atrial pressure of 3 mmHg.  Conclusion(s)/Recommendation(s): No evidence of valvular vegetations on this transthoracic echocardiogram. Would recommend a transesophageal echocardiogram to exclude infective endocarditis if clinically indicated. FINDINGS  Left Ventricle: Left ventricular ejection fraction, by estimation, is 60 to 65%. The left ventricle has normal function. The left ventricle has no regional wall motion abnormalities. The left ventricular internal cavity size was normal in size. There is  no left ventricular hypertrophy. Left ventricular diastolic function could not be evaluated. Right Ventricle: The right ventricular size is normal. No increase in right ventricular wall thickness. Right ventricular systolic function is normal. Tricuspid regurgitation signal is inadequate for assessing PA pressure. Left Atrium: Left atrial size was normal in size. Right Atrium: Right atrial  size was normal in size. Pericardium: There is no evidence of pericardial effusion. Mitral Valve: The mitral valve is normal in structure. Trivial mitral valve regurgitation. No evidence of mitral valve stenosis. Tricuspid Valve: The tricuspid valve is normal in structure. Tricuspid valve regurgitation is not demonstrated. No evidence of tricuspid stenosis. Aortic Valve: The aortic valve is normal in structure. Aortic valve regurgitation is not visualized. No aortic stenosis is present. Pulmonic Valve: The pulmonic valve was normal in structure. Pulmonic valve regurgitation is trivial. No evidence of pulmonic stenosis. Aorta: The aortic root is normal in size and structure. Venous: The inferior vena cava is normal in size with greater than 50% respiratory variability, suggesting right atrial pressure of 3 mmHg. IAS/Shunts: No atrial level shunt detected by color flow Doppler.  LV Volumes (MOD) LV vol d, MOD A4C: 51.1 ml LV vol s, MOD A4C: 17.2 ml LV SV MOD A4C:     51.1 ml IVC IVC diam: 1.50 cm Armanda Magic MD Electronically signed by Armanda Magic MD Signature  Date/Time: 02/05/2021/9:45:59 AM    Final     Scheduled Meds:  bictegravir-emtricitabine-tenofovir AF  1 tablet Oral Daily   Chlorhexidine Gluconate Cloth  6 each Topical Daily   cloNIDine  0.2 mg Transdermal Weekly   heparin injection (subcutaneous)  5,000 Units Subcutaneous Q8H   mirtazapine  45 mg Oral QHS   mupirocin ointment   Nasal BID   pregabalin  75 mg Oral BID   sodium chloride flush  10-40 mL Intracatheter Q12H   tamsulosin  0.4 mg Oral Daily   Continuous Infusions:  sodium chloride 10 mL/hr at 02/04/21 0636   dextrose 5 % and 0.9 % NaCl with KCl 20 mEq/L 50 mL/hr at 02/05/21 0048   levETIRAcetam 1,000 mg (02/05/21 0519)    LOS: 8 days   Merlene Laughter, DO [facility] PAGER is on AMION  If 7PM-7AM, please contact night-coverage [email or web address]

## 2021-02-06 LAB — COMPREHENSIVE METABOLIC PANEL
ALT: 162 U/L — ABNORMAL HIGH (ref 0–44)
AST: 54 U/L — ABNORMAL HIGH (ref 15–41)
Albumin: 3 g/dL — ABNORMAL LOW (ref 3.5–5.0)
Alkaline Phosphatase: 81 U/L (ref 38–126)
Anion gap: 8 (ref 5–15)
BUN: 9 mg/dL (ref 6–20)
CO2: 22 mmol/L (ref 22–32)
Calcium: 8.9 mg/dL (ref 8.9–10.3)
Chloride: 108 mmol/L (ref 98–111)
Creatinine, Ser: 1.08 mg/dL (ref 0.61–1.24)
GFR, Estimated: >60 mL/min (ref >60)
Glucose, Bld: 100 mg/dL — ABNORMAL HIGH (ref 70–99)
Potassium: 3.7 mmol/L (ref 3.5–5.1)
Sodium: 138 mmol/L (ref 135–145)
Total Bilirubin: 0.8 mg/dL (ref 0.3–1.2)
Total Protein: 6.2 g/dL — ABNORMAL LOW (ref 6.5–8.1)

## 2021-02-06 LAB — CBC WITH DIFFERENTIAL/PLATELET
Abs Immature Granulocytes: 0.14 10*3/uL — ABNORMAL HIGH (ref 0.00–0.07)
Basophils Absolute: 0 10*3/uL (ref 0.0–0.1)
Basophils Relative: 0 %
Eosinophils Absolute: 0.2 10*3/uL (ref 0.0–0.5)
Eosinophils Relative: 3 %
HCT: 33 % — ABNORMAL LOW (ref 39.0–52.0)
Hemoglobin: 11.3 g/dL — ABNORMAL LOW (ref 13.0–17.0)
Immature Granulocytes: 2 %
Lymphocytes Relative: 28 %
Lymphs Abs: 1.9 10*3/uL (ref 0.7–4.0)
MCH: 27.6 pg (ref 26.0–34.0)
MCHC: 34.2 g/dL (ref 30.0–36.0)
MCV: 80.7 fL (ref 80.0–100.0)
Monocytes Absolute: 0.8 10*3/uL (ref 0.1–1.0)
Monocytes Relative: 12 %
Neutro Abs: 3.8 10*3/uL (ref 1.7–7.7)
Neutrophils Relative %: 55 %
Platelets: 319 10*3/uL (ref 150–400)
RBC: 4.09 MIL/uL — ABNORMAL LOW (ref 4.22–5.81)
RDW: 14.6 % (ref 11.5–15.5)
WBC: 6.9 10*3/uL (ref 4.0–10.5)
nRBC: 0 % (ref 0.0–0.2)

## 2021-02-06 LAB — MAGNESIUM: Magnesium: 1.7 mg/dL (ref 1.7–2.4)

## 2021-02-06 LAB — PHOSPHORUS: Phosphorus: 4.2 mg/dL (ref 2.5–4.6)

## 2021-02-06 MED ORDER — MAGNESIUM SULFATE 2 GM/50ML IV SOLN
2.0000 g | Freq: Once | INTRAVENOUS | Status: AC
  Administered 2021-02-06: 2 g via INTRAVENOUS
  Filled 2021-02-06: qty 50

## 2021-02-06 MED ORDER — OLANZAPINE 5 MG PO TBDP
5.0000 mg | ORAL_TABLET | Freq: Two times a day (BID) | ORAL | Status: DC
  Administered 2021-02-06 – 2021-02-08 (×5): 5 mg via ORAL
  Filled 2021-02-06 (×6): qty 1

## 2021-02-06 NOTE — Progress Notes (Signed)
PT Cancellation Note  Patient Details Name: Howard Rowland MRN: [id number] DOB: [birth year]   Cancelled Treatment:    Reason Eval/Treat Not Completed: Fatigue/lethargy limiting ability to participate (pt received zypraxa and currently lethargic only about to arouse to briefly open eyes but not following commands or interacting)   Kortni Hasten B Tearah Saulsbury 02/06/2021, 11:03 AM Merryl Hacker, PT Acute Rehabilitation Services Pager: [phone] Office: [phone]
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
## 2021-02-06 NOTE — Progress Notes (Signed)
PROGRESS NOTE    Howard Rowland  IOE:[id number] DOB: Aug 12, 1990 DOA: 01/28/2021 PCP: Pcp, No   Brief Narrative:  The patient is a 30 year old male with a past medical history significant for but not limited to HIV admitted after he was found down in a hotel room presumed narcotic overdose.  Has had many recent visits to the ER with behavioral health involvement regarding depression.  He was admitted to ICU and developed acute respiratory failure due to aspiration pneumonia.  He is also noted to have a pneumothorax after central line was placed.  He had chest tube placed and was stabilized and transferred to the floor.  He continues to have altered mentation and there is also concern for questionable endocarditis.  Current significant events and procedures are as follows:  Significant events: 8/30 admitted after being found unresponsive 8/31 left pneumothorax 9/02 extubated, off CRRT 9/03 Lt chest tubed and CVL removed 9/04 transfer to progressive care   Procedures: 8/30 ETT > 9/02 8/30 R IJ HD cath > 9/03 8/30 L [place] CVL > 8/31 8/30 R femoral arterial line > 9/02 8/31 L IJ CVL > 9/3 8/31 L chest tube > 9/03   Studies: 8/30 CT angiogram chest > no PE, bilateral lower lobe infiltrates, considerable bilateral hilar and mediastinal adenopathy 8/31 TTE >LVEF 40-45%, dyskinesis of the left ventricular, basal inferolateral wall, mitral valve regurgitation and thickening, consider TEE, aortic valve with mild sclerosis 9/01 CT chest >> improved aeration at Rt lung base and LLL, decreased adenopathy, small volume Lt PTX with chest tube in place, mild subcutaneous emphysema 9/01 MRI brain >> 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  No seizure or epileptiform activity noted. 9/7 repeat TEE done and showed EF of 60-65% with undeterminable LVDF. No evidence of valvular vegetations on  this transthoracic echocardiogram. Would  recommend a  transesophageal echocardiogram to exclude infective endocarditis if clinically indicated. 9/8 Psych consulted   Patient still continued to remain confused and does not appropriately answer questions yesterday and  I talked to him he just started saying different dates and Thought he was going to the barbershop.  No other concerns and nephrology, neurology and infectious disease as well as critical care been involved in patient's care. This AM he is significantly somnolent and received IV Ativan early this AM.  Assessment & Plan:   Active Problems:   Respiratory failure (HCC)   Shock (HCC)   Status epilepticus (HCC)   Pneumothorax, left   HIV disease (HCC)   Altered mental status  Acute metabolic encephalopathy from hypoxia in the setting of accidental drug overdose ICU Delirium -Patient was seen by neurology.  Underwent MRI and EEG.   -Patient apparently had seizure activity after respiratory arrest.   -EEG has not shown any epileptiform activity.  Patient however remains on Keppra. -Apparently serum paraneoplastic panel was sent out to [facility] on 9/1.  These results are also pending. -Remains confused distracted and impulsive yesterday.   -Noted to be in restraints.   -Given his agitation yesterday today psychiatry has been consulted and they attempted to assess him but he was somnolent. Psych recommends starting Zyprexa Zydis 5 mg po BID for Psychosis    Concern for viral meningitis/mitral valve thickening with concern for endocarditis -Patient with history of HIV.   -Underwent a lumbar puncture due to his acute confusion.   -Was placed on broad-spectrum antibiotics.   -Currently he is noted to be on vancomycin and Unasyn.  Also noted to be on acyclovir.  CSF HSV IgM noted to be negative.   -Blood cultures were negative however transthoracic echocardiogram raise concern for mitral valve thickening.  Discussed with infectious disease who would prefer to have a TEE to  determine plan regarding antibiotics.   -Patient remains encephalopathic though it appears that he has been making some improvement.  We will wait for his mentation improves some more before pursuing TEE.  At his current mentation he will be unable to provide informed consent.  There is no next of kin or healthcare proxy available for decision-making.  In view of this and due to concern for endocarditis ID recommendation is to do 6 weeks of antibiotics with vancomycin and ceftriaxone.  Start date will be 8/30; social worker was able to track down patient's mother so we will need to discuss with her about obtaining a TEE however he did have a repeat TTE on 12/08/2020 with no concerns for vegetation bacteremia and endocarditis -ID now recommends discontinuing antibiotics and no need to get a TEE given the very low clinical suspicion for Endocardititis  -They are recommending continuing Biktarvy and get a 30-day supply for discharge [facility]  Aspiration pneumonia in the setting of HIV -Unasyn was initiated on August 31.  Has been changed over to ceftriaxone and now antibiotics have been discontinued.   -SpO2: 99 % O2 Flow Rate (L/min): 4 L/min FiO2 (%): 40 %; Respiratory status is stable.  No longer on oxygen.    -Will need an Ambulatory Home O2 Screen prior to D/C    Acute respiratory failure with hypoxia/pneumothorax -Developed left-sided pneumothorax after central line was placed.  Had a chest tube In ICU.  This has been removed.   -Respiratory status noted to be stable as above   History of depression/PTSD Apparently on Remeron and Lyrica prior to admission.  Resumed given his impulsiveness.  Consulted and psychiatry given that he remains in restraints and became combative yesterday -Given Ativan Early this AM and was very somnolent   Mediastinal Adenopathy Significance of this is unclear.  Might be reactive per pulmonology.  They recommend repeat imaging in  a few weeks and then to determine if bronchoscopy with EBUS is needed but they do feel that he does need a EBUS to guide mediastinal fine-needle aspiration and patient wants to do it in the outpatient setting with GI.  PCCM feels that this is not some medically pressing to obtain currently   History of HIV -Remains on antiretroviral treatment.  Infectious disease has been following.  CD4 was 707 on 8/30.  Antiretroviral treatment modified by infectious disease and they are recommending discharging on Biktarvy.   Abnormal LFTs, improving  -Thought to be secondary to shock as well as acute illness.  LFTs improving gradually with AST being 54 and ALT now being 162.   -Continue to monitor and trend hepatic function panel and repeat intermittently   Acute kidney injury, resolved -Thought to be secondary to rhabdomyolysis and ATN in the setting of shock.   -Patient was on CRRT in the ICU.   -A dialysis catheter was placed.  Nephrology was following.  Urine output was monitored.  Renal function has improved.   -Dialysis was discontinued.  Dialysis catheter was removed.  Nephrology has signed off. -Renal function has normalized his last BUN/creatinine being 13/0.98 and is now 9/1.08.  Monitor urine output. -Avoid further nephrotoxic medications, contrast dyes, hypotension and renally adjust  medications -Repeat CMP in a.m.  Intermittent urinary retention -Patient was noted to be retaining urine.  Required catheterization.  -Bladder scans every shift.  -C/w Tamsulosins 0.4 mg po Daily.  Anemia of critical illness/mild thrombocytopenia -Platelet counts appear to have improved.  Hemoglobin noted to be stable. -Patient's platelet count is now 319 and his hemoglobin/hematocrit is stable at 11.0/33.8 -> 11.3/33.0 -Continue to monitor for signs and symptoms bleeding; currently no overt bleeding noted  Dysphagia -Seen by speech therapy.  Cleared for regular diet.   Deconditioning -PT and OT evaluation  and they are recommending SNF currently and CIR can be reconsulted once we identify caregiver support and once the patient is more participatory and improved   DVT prophylaxis: Heparin 5,000 units sq q8h Code Status: FULL CODE  Family Communication: No family present at bedside  Disposition Plan: SNF vs CIR once patient improves  Status is: Inpatient  Remains inpatient appropriate because:Unsafe d/c plan, IV treatments appropriate due to intensity of illness or inability to take PO, and Inpatient level of care appropriate due to severity of illness  Dispo:  Patient From: Home  Planned Disposition: Inpatient Rehab  Medically stable for discharge: No    Consultants:  PCCM Nephrology ID Neurology  Psychiatry   Procedures: As above  Antimicrobials:  Anti-infectives (From admission, onward)    Start     Dose/Rate Route Frequency Ordered Stop   02/04/21 1030  vancomycin (VANCOREADY) IVPB 750 mg/150 mL  Status:  Discontinued        750 mg 150 mL/hr over 60 Minutes Intravenous Every 8 hours 02/04/21 0930 02/05/21 1025   02/04/21 1030  cefTRIAXone (ROCEPHIN) 2 g in sodium chloride 0.9 % 100 mL IVPB  Status:  Discontinued        2 g 200 mL/hr over 30 Minutes Intravenous Every 24 hours 02/04/21 0930 02/05/21 1025   02/04/21 1015  bictegravir-emtricitabine-tenofovir AF (BIKTARVY) 50-200-25 MG per tablet 1 tablet        1 tablet Oral Daily 02/04/21 0915     02/03/21 0800  vancomycin (VANCOREADY) IVPB 500 mg/100 mL  Status:  Discontinued        500 mg 100 mL/hr over 60 Minutes Intravenous Every 8 hours 02/03/21 0444 02/03/21 0444   02/03/21 0800  vancomycin (VANCOREADY) IVPB 750 mg/150 mL  Status:  Discontinued        750 mg 150 mL/hr over 60 Minutes Intravenous Every 8 hours 02/03/21 0444 02/04/21 0914   02/03/21 0630  acyclovir (ZOVIRAX) 700 mg in dextrose 5 % 100 mL IVPB  Status:  Discontinued        700 mg 114 mL/hr over 60 Minutes Intravenous Every 8 hours 02/03/21 0458 02/04/21  0914   01/31/21 1600  Ampicillin-Sulbactam (UNASYN) 3 g in sodium chloride 0.9 % 100 mL IVPB  Status:  Discontinued        3 g 200 mL/hr over 30 Minutes Intravenous Every 8 hours 01/31/21 0918 01/31/21 1208   01/31/21 1500  vancomycin (VANCOREADY) IVPB 500 mg/100 mL  Status:  Discontinued        500 mg 100 mL/hr over 60 Minutes Intravenous Every 12 hours 01/31/21 1410 02/03/21 0443   01/31/21 1300  Ampicillin-Sulbactam (UNASYN) 3 g in sodium chloride 0.9 % 100 mL IVPB  Status:  Discontinued        3 g 200 mL/hr over 30 Minutes Intravenous Every 6 hours 01/31/21 1208 02/04/21 0914   01/31/21 1000  dolutegravir (TIVICAY) tablet 50 mg  Status:  Discontinued        50 mg Oral Daily 01/31/21 0804 02/04/21 0915   01/31/21 1000  emtricitabine-tenofovir AF (DESCOVY) 200-25 MG per tablet 1 tablet  Status:  Discontinued        1 tablet Oral Daily 01/31/21 0804 02/04/21 0915   01/30/21 1845  dolutegravir (TIVICAY) tablet 50 mg  Status:  Discontinued        50 mg Per Tube Daily 01/30/21 1748 01/31/21 0804   01/30/21 1845  emtricitabine-tenofovir AF (DESCOVY) 200-25 MG per tablet 1 tablet  Status:  Discontinued        1 tablet Per Tube Daily 01/30/21 1748 01/31/21 0804   01/29/21 1800  acyclovir (ZOVIRAX) 670 mg in dextrose 5 % 100 mL IVPB  Status:  Discontinued        10 mg/kg  67.1 kg 113.4 mL/hr over 60 Minutes Intravenous Every 24 hours 01/28/21 2150 02/03/21 0457   01/29/21 1800  vancomycin (VANCOREADY) IVPB 750 mg/150 mL  Status:  Discontinued        750 mg 150 mL/hr over 60 Minutes Intravenous Every 24 hours 01/28/21 2150 01/31/21 1410   01/29/21 1600  Ampicillin-Sulbactam (UNASYN) 3 g in sodium chloride 0.9 % 100 mL IVPB  Status:  Discontinued        3 g 200 mL/hr over 30 Minutes Intravenous Every 8 hours 01/29/21 1443 01/31/21 0918   01/29/21 1545  dolutegravir (TIVICAY) tablet 50 mg  Status:  Discontinued        50 mg Per Tube Daily 01/29/21 1445 01/30/21 0720   01/29/21 1545   emtricitabine-tenofovir AF (DESCOVY) 200-25 MG per tablet 1 tablet  Status:  Discontinued        1 tablet Per Tube Daily 01/29/21 1445 01/30/21 0720   01/29/21 0300  ceFEPIme (MAXIPIME) 2 g in sodium chloride 0.9 % 100 mL IVPB  Status:  Discontinued        2 g 200 mL/hr over 30 Minutes Intravenous Every 12 hours 01/28/21 1427 01/28/21 1432   01/28/21 2000  ceFEPIme (MAXIPIME) 2 g in sodium chloride 0.9 % 100 mL IVPB  Status:  Discontinued        2 g 200 mL/hr over 30 Minutes Intravenous Every 12 hours 01/28/21 1749 01/29/21 1407   01/28/21 1830  vancomycin (VANCOREADY) IVPB 1500 mg/300 mL        1,500 mg 150 mL/hr over 120 Minutes Intravenous  Once 01/28/21 1732 01/28/21 2022   01/28/21 1500  ceFEPIme (MAXIPIME) 2 g in sodium chloride 0.9 % 100 mL IVPB  Status:  Discontinued        2 g 200 mL/hr over 30 Minutes Intravenous Every 12 hours 01/28/21 1432 01/28/21 1749   01/28/21 1422  vancomycin variable dose per unstable renal function (pharmacist dosing)  Status:  Discontinued         Does not apply See admin instructions 01/28/21 1422 01/28/21 1732   01/28/21 1400  acyclovir (ZOVIRAX) 670 mg in dextrose 5 % 100 mL IVPB  Status:  Discontinued        10 mg/kg  67.1 kg 113.4 mL/hr over 60 Minutes Intravenous Every 12 hours 01/28/21 1338 01/28/21 2150   01/28/21 1300  ceFEPIme (MAXIPIME) 2 g in sodium chloride 0.9 % 100 mL IVPB  Status:  Discontinued        2 g 200 mL/hr over 30 Minutes Intravenous  Once 01/28/21 1253 01/28/21 1431   01/28/21 1300  ampicillin (OMNIPEN) 1 g in sodium chloride 0.9 %  100 mL IVPB  Status:  Discontinued        1 g 300 mL/hr over 20 Minutes Intravenous  Once 01/28/21 1255 01/28/21 1423   01/28/21 1200  cefTRIAXone (ROCEPHIN) 2 g in sodium chloride 0.9 % 100 mL IVPB  Status:  Discontinued        2 g 200 mL/hr over 30 Minutes Intravenous  Once 01/28/21 1158 01/28/21 1253   01/28/21 1200  vancomycin (VANCOREADY) IVPB 1500 mg/300 mL  Status:  Discontinued         1,500 mg 150 mL/hr over 120 Minutes Intravenous  Once 01/28/21 1158 01/28/21 1732        Subjective: Seen and examined at bedside and he was very somnolent this morning and not able to participate in examination given his lethargy and fatigue.  He yesterday tried to kick a nurse but this morning he was given IV Ativan early morning and would not wake up to participate.  Objective: Vitals:   02/06/21 0000 02/06/21 0400 02/06/21 0800 02/06/21 1600  BP:  134/83 (!) 153/96 (!) 100/58  Pulse: 70 73 63 86  Resp:   16 15  Temp: 98.9 F (37.2 C) 98.6 F (37 C) 98.2 F (36.8 C) 98.6 F (37 C)  TempSrc: Oral Oral Axillary Axillary  SpO2: 97% 97% 99% 99%  Weight:      Height:        Intake/Output Summary (Last 24 hours) at 02/06/2021 1806 Last data filed at 02/06/2021 1500 Gross per 24 hour  Intake 480 ml  Output 1636 ml  Net -1156 ml    Filed Weights   02/01/21 0426 02/02/21 0500 02/02/21 1700  Weight: 68 kg 66.7 kg 70.8 kg   Examination: Physical Exam:  Constitutional: WN/WD AAM in NAD and appears calm and extremely somnolent and sleepy Eyes: Lids are closed ENMT: External Ears, Nose appear normal.  Neck: Appears normal, supple, no cervical masses, normal ROM, no appreciable thyromegaly; no appreciable JVD Respiratory: Diminished to auscultation bilaterally, no wheezing, rales, rhonchi or crackles. Normal respiratory effort and patient is not tachypenic. No accessory muscle use.  Unlabored breathing Cardiovascular: RRR, no murmurs / rubs / gallops. S1 and S2 auscultated. No extremity edema.  Abdomen: Soft, non-tender, non-distended. Bowel sounds positive.  GU: Deferred. Musculoskeletal: No clubbing / cyanosis of digits/nails. No joint deformity upper and lower extremities but he remains in restraints Skin: No rashes, lesions, ulcers on limited skin evaluation. No induration; Warm and dry.  Neurologic: Patient is extremely somnolent and does not follow commands Psychiatric:  Impaired judgment and insight.  He is not alert and oriented x 3 given his lethargy and somnolence  Data Reviewed: I have personally reviewed following labs and imaging studies  CBC: Recent Labs  Lab 01/31/21 0515 02/01/21 0516 02/03/21 0345 02/04/21 0038 02/05/21 0136 02/06/21 0352  WBC 8.8 7.4 9.2 8.2 7.4 6.9  NEUTROABS 6.1  --   --   --   --  3.8  HGB 10.1* 10.0* 11.2* 11.8* 11.0* 11.3*  HCT 30.4* 31.6* 33.7* 35.6* 33.8* 33.0*  MCV 81.3 84.9 81.0 81.1 81.8 80.7  PLT 120* 120* 175 239 272 319    Basic Metabolic Panel: Recent Labs  Lab 01/31/21 0515 02/01/21 0516 02/02/21 0633 02/03/21 0345 02/04/21 0038 02/05/21 0136 02/06/21 0352  NA 139 139 137 137 140 138 138  K 3.0* 3.8 3.7 4.0 3.9 3.7 3.7  CL 108 105 102 105 109 108 108  CO2 21* 24 22 22  GLUCOSE 116* 80 91 98 97 91 100*  BUN 15 11 13 13 8 13 9   CREATININE 0.89 1.06 1.01 0.98 1.00 0.98 1.08  CALCIUM 8.0* 8.0* 8.5* 8.5* 8.7* 9.0 8.9  MG 2.2 2.1  --   --   --   --  1.7  PHOS 1.2* 3.4  --   --   --   --  4.2    GFR: Estimated Creatinine Clearance: 98.5 mL/min (by C-G formula based on SCr of 1.08 mg/dL). Liver Function Tests: Recent Labs  Lab 02/02/21 1610 02/03/21 0345 02/04/21 0038 02/05/21 0136 02/06/21 0352  AST 365* 223* 134* 86* 54*  ALT 474* 359* 273* 210* 162*  ALKPHOS 105 93 88 89 81  BILITOT 1.6* 1.4* 0.8 1.0 0.8  PROT 5.8* 6.2* 6.1* 6.0* 6.2*  ALBUMIN 2.8* 3.0* 2.9* 3.0* 3.0*    No results for input(s): LIPASE, AMYLASE in the last 168 hours. No results for input(s): AMMONIA in the last 168 hours. Coagulation Profile: No results for input(s): INR, PROTIME in the last 168 hours. Cardiac Enzymes: No results for input(s): CKTOTAL, CKMB, CKMBINDEX, TROPONINI in the last 168 hours. BNP (last 3 results) No results for input(s): PROBNP in the last 8760 hours. HbA1C: No results for input(s): HGBA1C in the last 72 hours. CBG: Recent Labs  Lab 02/01/21 2306 02/02/21 0308  02/02/21 0707 02/02/21 1224 02/02/21 1639  GLUCAP 70 73 70 97 100*    Lipid Profile: No results for input(s): CHOL, HDL, LDLCALC, TRIG, CHOLHDL, LDLDIRECT in the last 72 hours. Thyroid Function Tests: No results for input(s): TSH, T4TOTAL, FREET4, T3FREE, THYROIDAB in the last 72 hours. Anemia Panel: No results for input(s): VITAMINB12, FOLATE, FERRITIN, TIBC, IRON, RETICCTPCT in the last 72 hours. Sepsis Labs: Recent Labs  Lab 01/30/21 2133  LATICACIDVEN 1.1     Recent Results (from the past 240 hour(s))  Resp Panel by RT-PCR (Flu A&B, Covid) Nasopharyngeal Swab     Status: None   Collection Time: 01/28/21 11:54 AM   Specimen: Nasopharyngeal Swab; Nasopharyngeal(NP) swabs in vial transport medium  Result Value Ref Range Status   SARS Coronavirus 2 by RT PCR NEGATIVE NEGATIVE Final    Comment: (NOTE) SARS-CoV-2 target nucleic acids are NOT DETECTED.  The SARS-CoV-2 RNA is generally detectable in upper respiratory specimens during the acute phase of infection. The lowest concentration of SARS-CoV-2 viral copies this assay can detect is 138 copies/mL. A negative result does not preclude SARS-Cov-2 infection and should not be used as the sole basis for treatment or other patient management decisions. A negative result may occur with  improper specimen collection/handling, submission of specimen other than nasopharyngeal swab, presence of viral mutation(s) within the areas targeted by this assay, and inadequate number of viral copies(<138 copies/mL). A negative result must be combined with clinical observations, patient history, and epidemiological information. The expected result is Negative.  Fact Sheet for Patients:  BloggerCourse.com  Fact Sheet for Healthcare Providers:  SeriousBroker.it  This test is no t yet approved or cleared by the [place] FDA and  has been authorized for detection and/or diagnosis of  SARS-CoV-2 by FDA under an Emergency Use Authorization (EUA). This EUA will remain  in effect (meaning this test can be used) for the duration of the COVID-19 declaration under Section 564(b)(1) of the Act, 21 U.S.C.section 360bbb-3(b)(1), unless the authorization is terminated  or revoked sooner.       Influenza A by PCR NEGATIVE NEGATIVE Final   Influenza B by  PCR NEGATIVE NEGATIVE Final    Comment: (NOTE) The Xpert Xpress SARS-CoV-2/FLU/RSV plus assay is intended as an aid in the diagnosis of influenza from Nasopharyngeal swab specimens and should not be used as a sole basis for treatment. Nasal washings and aspirates are unacceptable for Xpert Xpress SARS-CoV-2/FLU/RSV testing.  Fact Sheet for Patients: BloggerCourse.com  Fact Sheet for Healthcare Providers: SeriousBroker.it  This test is not yet approved or cleared by the [place] FDA and has been authorized for detection and/or diagnosis of SARS-CoV-2 by FDA under an Emergency Use Authorization (EUA). This EUA will remain in effect (meaning this test can be used) for the duration of the COVID-19 declaration under Section 564(b)(1) of the Act, 21 U.S.C. section 360bbb-3(b)(1), unless the authorization is terminated or revoked.  Performed at [facility], [address], [place], [place] [address]   Culture, blood (routine x 2)     Status: None   Collection Time: 01/28/21 12:51 PM   Specimen: BLOOD  Result Value Ref Range Status   Specimen Description BLOOD LEFT ANTECUBITAL  Final   Special Requests   Final    BOTTLES DRAWN AEROBIC ONLY Blood Culture adequate volume   Culture   Final    NO GROWTH 5 DAYS Performed at [facility], [address], [place], [place] [address]    Report Status 02/02/2021 FINAL  Final  Urine Culture     Status: None   Collection Time: 01/28/21 12:51 PM   Specimen: Urine, Catheterized  Result Value Ref Range Status    Specimen Description URINE, CATHETERIZED  Final   Special Requests NONE  Final   Culture   Final    NO GROWTH Performed at [facility], [address] [place], [place] [address]    Report Status 01/30/2021 FINAL  Final  HSV 1/2 Ab IgG/IgM CSF     Status: None   Collection Time: 01/28/21 12:57 PM   Specimen: CSF; Cerebrospinal Fluid  Result Value Ref Range Status   HSV 1/2 Ab, IgM, CSF 0.11 <=0.89 IV Final    Comment: (NOTE) INTERPRETIVE INFORMATION: Herpes Simplex Virus                          Type 1 and/or 2 Antibodies,                          IgM by ELISA, CSF  0.89 IV or Less .......... Negative: No significant                             level of detectable HSV IgM                             antibody.  0.90 - 1.09 IV ........... Equivocal: Questionable                             presence of IgM antibodies.                             Repeat testing in 10-14 days                             may be helpful.  1.10 IV or Greater ....... Positive: IgM antibody to HSV                             detected, which may indicate a                             current or recent infection.                             However, low levels of IgM                             antibodies may occasionally                             persist for more than 12                             months post-infection. The detection of antibodies to herpes simplex virus in CSF may indicate central nervou s system infection. However, consideration must be given to possible contamination by blood or transfer of serum antibodies across the blood-brain barrier. Fourfold or greater rise in CSF antibodies to herpes on specimens at least 4 weeks apart are found in 74-94 % of patients with herpes encephalitis. Specificity of the test based on a single CSF testing is not established. Presently PCR is the primary means of establishing a diagnosis of herpes encephalitis. This test was developed and its  performance characteristics determined by [facility]. It has not been cleared or approved by the [place] Food and Drug Administration. This test was performed in a CLIA certified laboratory and is intended for clinical purposes.    HSV 1/2 Ab Screen IgG, CSF 0.52 <=0.89 IV Final    Comment: (NOTE) INTERPRETIVE INFORMATION: Herpes Simplex Virus Type 1 and/or 2                    Antibodies, IgG CSF  0.89 IV or Less .......... Negative: No significant                             level of detectable HSV IgG                             antibody.  0.90 - 1.09 IV ........... Equivocal: Questionable                             presence of IgG antibodies.                             Repeat testing in 10-14 days                             may be helpful.  1.10 IV or Greater ....... Positive: IgG antibody to HSV                             detected, which may indicate  a current or past HSV                             infection. The detection of antibodies to herpes simplex virus in CSF may indicate central nervous system infection. However, consideration must be given to possible contamination by blood or transfer of serum antibodies across the blood-brain barrier. Fourfold or greater rise in CSF antibodies to herpes on specimens at [place] 4 weeks apart are found in 74-94 % of patients with herpes encephalitis. Specificity of the test based on a single CSF testing is not established. Presently PCR is the primary means of establishing a diagnosis of herpes encephalitis. This test was developed and its performance characteristics determined by [employer]. It has not been cleared or approved by the [place] Food and Drug Administration. This test was performed in a CLIA certified laboratory and is intended for clinical purposes. **Effective March 10, 2021 409811 HSV 1/2 Ab IgG/IgM CSF will be**  made non-orderable. [facility] offers order code [id number] HSV 1/2   PCR, CSF. For further information, please contact your local  [facility] Representative. Performed At: [facility] [address] [place], [place] [id number] Elinor Parkinson I MD OZ:[id number]   CSF culture w Gram Stain     Status: None   Collection Time: 01/28/21  5:24 PM   Specimen: CSF  Result Value Ref Range Status   Specimen Description CSF  Final   Special Requests CSF  Final   Gram Stain NO ORGANISMS SEEN CYTOSPIN SMEAR   Final   Culture   Final    NO GROWTH 3 DAYS Performed at [facility], [address] [place], [place] [address]    Report Status 02/01/2021 FINAL  Final  Culture, blood (routine x 2)     Status: None   Collection Time: 01/28/21  5:25 PM   Specimen: BLOOD  Result Value Ref Range Status   Specimen Description BLOOD LEFT ANTECUBITAL  Final   Special Requests   Final    BOTTLES DRAWN AEROBIC AND ANAEROBIC Blood Culture results may not be optimal due to an inadequate volume of blood received in culture bottles   Culture   Final    NO GROWTH 5 DAYS Performed at [facility], [address], [place], [place] [address]    Report Status 02/02/2021 FINAL  Final  Culture, Respiratory w Gram Stain (tracheal aspirate)     Status: None   Collection Time: 01/28/21 11:17 PM   Specimen: Tracheal Aspirate; Respiratory  Result Value Ref Range Status   Specimen Description TRACHEAL ASPIRATE  Final   Special Requests Immunocompromised  Final   Gram Stain   Final    FEW SQUAMOUS EPITHELIAL CELLS PRESENT ABUNDANT WBC PRESENT, PREDOMINANTLY PMN FEW GRAM POSITIVE COCCI    Culture   Final    RARE Consistent with normal respiratory flora. No Pseudomonas species isolated Performed at [facility], [address] [place], [place] [address]    Report Status 01/31/2021 FINAL  Final  Culture, blood (routine x 2)     Status: None   Collection Time: 01/29/21  4:01 PM   Specimen: BLOOD  Result Value Ref Range Status   Specimen Description BLOOD  LEFT ANTECUBITAL  Final   Special Requests   Final    BOTTLES DRAWN AEROBIC AND ANAEROBIC Blood Culture adequate volume   Culture   Final    NO  GROWTH 5 DAYS Performed at [facility], [address], [place], [place] [address]    Report Status 02/03/2021 FINAL  Final  Culture, blood (routine x 2)     Status: None   Collection Time: 01/29/21  4:07 PM   Specimen: BLOOD LEFT HAND  Result Value Ref Range Status   Specimen Description BLOOD LEFT HAND  Final   Special Requests   Final    BOTTLES DRAWN AEROBIC ONLY Blood Culture adequate volume   Culture   Final    NO GROWTH 5 DAYS Performed at [facility], [address], [place], [place] [address]    Report Status 02/03/2021 FINAL  Final  Respiratory (~20 pathogens) panel by PCR     Status: None   Collection Time: 01/29/21  4:17 PM   Specimen: Nasopharyngeal Swab; Respiratory  Result Value Ref Range Status   Adenovirus NOT DETECTED NOT DETECTED Final   Coronavirus 229E NOT DETECTED NOT DETECTED Final    Comment: (NOTE) The Coronavirus on the Respiratory Panel, DOES NOT test for the novel  Coronavirus (2019 nCoV)    Coronavirus HKU1 NOT DETECTED NOT DETECTED Final   Coronavirus NL63 NOT DETECTED NOT DETECTED Final   Coronavirus OC43 NOT DETECTED NOT DETECTED Final   Metapneumovirus NOT DETECTED NOT DETECTED Final   Rhinovirus / Enterovirus NOT DETECTED NOT DETECTED Final   Influenza A NOT DETECTED NOT DETECTED Final   Influenza B NOT DETECTED NOT DETECTED Final   Parainfluenza Virus 1 NOT DETECTED NOT DETECTED Final   Parainfluenza Virus 2 NOT DETECTED NOT DETECTED Final   Parainfluenza Virus 3 NOT DETECTED NOT DETECTED Final   Parainfluenza Virus 4 NOT DETECTED NOT DETECTED Final   Respiratory Syncytial Virus NOT DETECTED NOT DETECTED Final   Bordetella pertussis NOT DETECTED NOT DETECTED Final   Bordetella Parapertussis NOT DETECTED NOT DETECTED Final   Chlamydophila pneumoniae NOT DETECTED NOT DETECTED Final    Mycoplasma pneumoniae NOT DETECTED NOT DETECTED Final    Comment: Performed at [facility], [address] [place], [place] [address]  Blastomyces Antigen     Status: None   Collection Time: 01/29/21  6:30 PM   Specimen: Blood  Result Value Ref Range Status   Blastomyces Antigen None Detected None Detected ng/mL Final    Comment: (NOTE) Results reported as ng/mL in 0.2 - 14.7 ng/mL range Results above the limit of detection but below 0.2 ng/mL are reported as 'Positive, Below the Limit of Quantification' Results above 14.7 ng/mL are reported as 'Positive, Above the Limit of Quantification'    Specimen Type SERUM  Final    Comment: (NOTE) Performed At: [facility] [address] [place], [place] [id number] Phylis Bougie MD XB:[id number]   Georgana Curio, PCR     Status: None   Collection Time: 01/30/21  3:21 AM   Specimen: Blood  Result Value Ref Range Status   Toxoplasma Gondii, PCR Negative Negative Final    Comment: (NOTE) No Toxoplasma gondii DNA detected. This test was developed and its performance characteristics determined by [employer]. It has not been cleared or approved by the U.S. Food and Drug Administration. The FDA has determined that such clearance or approval is not necessary. This test is used for clinical purposes. It should not be regarded as investigational or research. Performed At: [facility] [address] [place], [place] [id number] Jolene Schimke MD QI:[id number]   MRSA Next Gen by PCR, Nasal  Status: Abnormal   Collection Time: 01/30/21  8:22 AM   Specimen: Nasal Mucosa; Nasal Swab  Result Value Ref Range Status   MRSA by PCR Next Gen DETECTED (A) NOT DETECTED Final    Comment: RESULT CALLED TO, READ BACK BY AND VERIFIED WITH: RN J.GRANDOS AT [phone] ON 01/30/2021 BY T.SAAD. (NOTE) The GeneXpert MRSA Assay (FDA approved for NASAL specimens only), is one component of a comprehensive MRSA  colonization surveillance program. It is not intended to diagnose MRSA infection nor to guide or monitor treatment for MRSA infections. Test performance is not FDA approved in patients less than 17 years old. Performed at [facility], [address] [place], [place] [address]       RN Pressure Injury Documentation:     Estimated body mass index is 23.73 kg/m as calculated from the following:   Height as of this encounter:  (1.727 m).   Weight as of this encounter: 70.8 kg.  Malnutrition Type:  Nutrition Problem: Increased nutrient needs Etiology: acute illness (Renal failure requiring CRRT)  Malnutrition Characteristics:  Signs/Symptoms: estimated needs  Nutrition Interventions:  Interventions: Ensure Enlive (each supplement provides 350kcal and 20 grams of protein), Magic cup   Radiology Studies: ECHOCARDIOGRAM LIMITED  Result Date: 02/05/2021    ECHOCARDIOGRAM LIMITED REPORT   Patient Name:   BRODERIC BARA Date of Exam: 02/05/2021 Medical Rec #:  [id number]     Height:       68.0 in Accession #:    [id number]    Weight:       156.1 lb Date of Birth:  1991-04-27     BSA:          1.839 m Patient Age:    30 years      BP:           135/83 mmHg Patient Gender: M             HR:           76 bpm. Exam Location:  Inpatient Procedure: 2D Echo, Limited Echo and Color Doppler Indications:    endocarditis  History:        Patient has prior history of Echocardiogram examinations, most                 recent 01/29/2021.  Sonographer:    MH Referring Phys: [id number] [facility] IMPRESSIONS  1. Left ventricular ejection fraction, by estimation, is 60 to 65%. The left ventricle has normal function. The left ventricle has no regional wall motion abnormalities. Left ventricular diastolic function could not be evaluated.  2. Right ventricular systolic function is normal. The right ventricular size is normal. Tricuspid regurgitation signal is inadequate for assessing PA pressure.  3. The  mitral valve is normal in structure. Trivial mitral valve regurgitation. No evidence of mitral stenosis.  4. The aortic valve is normal in structure. Aortic valve regurgitation is not visualized. No aortic stenosis is present.  5. The inferior vena cava is normal in size with greater than 50% respiratory variability, suggesting right atrial pressure of 3 mmHg. Conclusion(s)/Recommendation(s): No evidence of valvular vegetations on this transthoracic echocardiogram. Would recommend a transesophageal echocardiogram to exclude infective endocarditis if clinically indicated. FINDINGS  Left Ventricle: Left ventricular ejection fraction, by estimation, is 60 to 65%. The left ventricle has normal function. The left ventricle has no regional wall motion abnormalities. The left ventricular internal cavity size was normal in size. There is  no left ventricular hypertrophy. Left ventricular  diastolic function could not be evaluated. Right Ventricle: The right ventricular size is normal. No increase in right ventricular wall thickness. Right ventricular systolic function is normal. Tricuspid regurgitation signal is inadequate for assessing PA pressure. Left Atrium: Left atrial size was normal in size. Right Atrium: Right atrial size was normal in size. Pericardium: There is no evidence of pericardial effusion. Mitral Valve: The mitral valve is normal in structure. Trivial mitral valve regurgitation. No evidence of mitral valve stenosis. Tricuspid Valve: The tricuspid valve is normal in structure. Tricuspid valve regurgitation is not demonstrated. No evidence of tricuspid stenosis. Aortic Valve: The aortic valve is normal in structure. Aortic valve regurgitation is not visualized. No aortic stenosis is present. Pulmonic Valve: The pulmonic valve was normal in structure. Pulmonic valve regurgitation is trivial. No evidence of pulmonic stenosis. Aorta: The aortic root is normal in size and structure. Venous: The inferior vena cava  is normal in size with greater than 50% respiratory variability, suggesting right atrial pressure of 3 mmHg. IAS/Shunts: No atrial level shunt detected by color flow Doppler.  LV Volumes (MOD) LV vol d, MOD A4C: 51.1 ml LV vol s, MOD A4C: 17.2 ml LV SV MOD A4C:     51.1 ml IVC IVC diam: 1.50 cm Armanda Magic MD Electronically signed by Armanda Magic MD Signature Date/Time: 02/05/2021/9:45:59 AM    Final     Scheduled Meds:  bictegravir-emtricitabine-tenofovir AF  1 tablet Oral Daily   Chlorhexidine Gluconate Cloth  6 each Topical Daily   cloNIDine  0.2 mg Transdermal Weekly   heparin injection (subcutaneous)  5,000 Units Subcutaneous Q8H   mirtazapine  45 mg Oral QHS   mupirocin ointment   Nasal BID   OLANZapine zydis  5 mg Oral BID   pregabalin  75 mg Oral BID   sodium chloride flush  10-40 mL Intracatheter Q12H   tamsulosin  0.4 mg Oral Daily   Continuous Infusions:  sodium chloride 10 mL/hr at 02/04/21 0636   dextrose 5 % and 0.9 % NaCl with KCl 20 mEq/L 50 mL/hr at 02/06/21 0631   levETIRAcetam 1,000 mg (02/06/21 1729)    LOS: 9 days   Merlene Laughter, DO [facility] PAGER is on AMION  If 7PM-7AM, please contact night-coverage [email or web address]

## 2021-02-06 NOTE — Consult Note (Signed)
The patient is a 30 year old male with a past medical history significant for but not limited to HIV admitted after he was found down in a hotel room presumed narcotic overdose.  Has had many recent visits to the ER with behavioral health involvement regarding depression.  He was admitted to ICU and developed acute respiratory failure due to aspiration pneumonia.  He is also noted to have a pneumothorax after central line was placed.  He had chest tube placed and was stabilized and transferred to the floor.  He continues to have altered mentation and there is also concern for questionable endocarditis.  Psych consult placed for agitation, aggression, and narcotic overdose. Patient is seen and attempted to evaluate, is currently observed to be sleep lying in bed in four-point restraints.  Patient is difficult to arouse.  Spoke with nursing staff located outside of his room, who reports patient seemed to be doing better this morning.  His nurse reports" he was more oriented, he was able to identify himself, where he is at.  He also ate his breakfast.  And took his medications without a fight.  Totally different from yesterday."  Patient has known pre-existing psychiatric history, which from it indicates his current agitation and aggression is likely due to encephalopathy versus an agitated delirium.   Due to his level of agitation, aggression, and combativeness with nursing staff, Zyprexa Zydis 5 mg p.o. twice daily was initiated.  Agitated Delirium likely related to ICU admission: -EKG was obtained, which shows QTC of 385. -Start Zyprexa Zydis 5 mg p.o. twice daily for psychosis.  -Will attempt to reassess tomorrow.

## 2021-02-06 NOTE — Progress Notes (Signed)
Pt as of this time is A&Ox4 and cooperative with nursing staff. He informed me that he had the urge to urinate and his bladder felt full. He was bladder scanned and had on bladder scan, pt stated after if he sat up on edge of bed he could urinated in urinal. pt assisted to edge of bed and urinated in urinal with an output of . Bladder scanned after and has left in bladder. Pt felt relief after voiding. Dr Marland Mcalpine informed of change in status.

## 2021-02-06 NOTE — Plan of Care (Signed)

## 2021-02-06 NOTE — Progress Notes (Signed)
No restraints in place during shift assessment. Pt sleeping but arousable. A/Ox2-3. Pt calm, cooperative and able to follow commands.

## 2021-02-06 NOTE — Plan of Care (Signed)

## 2021-02-07 DIAGNOSIS — T402X4S Poisoning by other opioids, undetermined, sequela: Secondary | ICD-10-CM

## 2021-02-07 DIAGNOSIS — T40601S Poisoning by unspecified narcotics, accidental (unintentional), sequela: Secondary | ICD-10-CM

## 2021-02-07 DIAGNOSIS — R4182 Altered mental status, unspecified: Secondary | ICD-10-CM

## 2021-02-07 DIAGNOSIS — G9349 Other encephalopathy: Secondary | ICD-10-CM

## 2021-02-07 LAB — CBC WITH DIFFERENTIAL/PLATELET
Abs Immature Granulocytes: 0.2 10*3/uL — ABNORMAL HIGH (ref 0.00–0.07)
Basophils Absolute: 0 10*3/uL (ref 0.0–0.1)
Basophils Relative: 0 %
Eosinophils Absolute: 0.2 10*3/uL (ref 0.0–0.5)
Eosinophils Relative: 3 %
HCT: 31.7 % — ABNORMAL LOW (ref 39.0–52.0)
Hemoglobin: 10.4 g/dL — ABNORMAL LOW (ref 13.0–17.0)
Immature Granulocytes: 3 %
Lymphocytes Relative: 31 %
Lymphs Abs: 2.3 10*3/uL (ref 0.7–4.0)
MCH: 27 pg (ref 26.0–34.0)
MCHC: 32.8 g/dL (ref 30.0–36.0)
MCV: 82.3 fL (ref 80.0–100.0)
Monocytes Absolute: 0.8 10*3/uL (ref 0.1–1.0)
Monocytes Relative: 10 %
Neutro Abs: 4 10*3/uL (ref 1.7–7.7)
Neutrophils Relative %: 53 %
Platelets: 334 10*3/uL (ref 150–400)
RBC: 3.85 MIL/uL — ABNORMAL LOW (ref 4.22–5.81)
RDW: 15 % (ref 11.5–15.5)
WBC: 7.5 10*3/uL (ref 4.0–10.5)
nRBC: 0 % (ref 0.0–0.2)

## 2021-02-07 LAB — COMPREHENSIVE METABOLIC PANEL
ALT: 124 U/L — ABNORMAL HIGH (ref 0–44)
AST: 43 U/L — ABNORMAL HIGH (ref 15–41)
Albumin: 3 g/dL — ABNORMAL LOW (ref 3.5–5.0)
Alkaline Phosphatase: 90 U/L (ref 38–126)
Anion gap: 7 (ref 5–15)
BUN: 11 mg/dL (ref 6–20)
CO2: 24 mmol/L (ref 22–32)
Calcium: 9 mg/dL (ref 8.9–10.3)
Chloride: 106 mmol/L (ref 98–111)
Creatinine, Ser: 1.08 mg/dL (ref 0.61–1.24)
GFR, Estimated: >60 mL/min (ref >60)
Glucose, Bld: 101 mg/dL — ABNORMAL HIGH (ref 70–99)
Potassium: 3.7 mmol/L (ref 3.5–5.1)
Sodium: 137 mmol/L (ref 135–145)
Total Bilirubin: 0.8 mg/dL (ref 0.3–1.2)
Total Protein: 5.8 g/dL — ABNORMAL LOW (ref 6.5–8.1)

## 2021-02-07 LAB — PHOSPHORUS: Phosphorus: 4.3 mg/dL (ref 2.5–4.6)

## 2021-02-07 LAB — MAGNESIUM: Magnesium: 1.9 mg/dL (ref 1.7–2.4)

## 2021-02-07 NOTE — Progress Notes (Signed)
Occupational Therapy Treatment Patient Details Name: Howard Rowland MRN: [id number] DOB: 03-02-1991 Today's Date: 02/07/2021    History of present illness 30 yo admitted 8/30 after found unresponsive by bystander. Pt with hypotension and seizure activity in ED and intubated. Pt with acute renal failure, rhabdomyolysis and encephalopathy. 8/31 PTX with chest tube 8/31-9/3. 9/1 MRI suggestive of acute toxic leukoencephlalopathy. Extubated 9/2. PMhx: PTSD, depression, HIV, polysubstance abuse   OT comments  Patient agreeable to OT treatment.  Patient appeared more coherent and was able to carry on a conversation with cueing. Patient performed washing face with supervision from bed level.  Patient required mod assist to get to bed and therapist assisted patient with donning gown.  Patient had difficulty keeping balance and required assistance from one extremity to keep balance.  Patient asked to return to bed following 10+ minutes on eob and completed donning gown and assisted patient with positioning in bed.  Acute OT to continue to follow.   Follow Up Recommendations  CIR;Supervision/Assistance - 24 hour    Equipment Recommendations  Other (comment)    Recommendations for Other Services Rehab consult    Precautions / Restrictions Precautions Precautions: Fall Precaution Comments: Not on restraints on this date Restrictions Weight Bearing Restrictions: No       Mobility Bed Mobility Overal bed mobility: Needs Assistance Bed Mobility: Supine to Sit;Sit to Supine     Supine to sit: Mod assist Sit to supine: Mod assist   General bed mobility comments: increased participation with sit to supine and supine to sit.  Max assist to position in bed.    Transfers                      Balance Overall balance assessment: Needs assistance Sitting-balance support: Single extremity supported;Feet supported Sitting balance-Leahy Scale: Poor Sitting balance - Comments: had difficulty  keeping balance and required assistance of one extremity Postural control: Left lateral lean;Posterior lean                                 ADL either performed or assessed with clinical judgement   ADL       Grooming: Wash/dry face;Bed level;Supervision/safety;Cueing for sequencing Grooming Details (indicate cue type and reason): Patient was able to wash face at bed level         Upper Body Dressing : Maximal assistance;Sitting Upper Body Dressing Details (indicate cue type and reason): max assist to donn gown on eob, completed while supine                         Vision       Perception     Praxis      Cognition Arousal/Alertness: Lethargic Behavior During Therapy: Flat affect Overall Cognitive Status: No family/caregiver present to determine baseline cognitive functioning Area of Impairment: Attention;Safety/judgement                 Orientation Level: Disoriented to;Situation Current Attention Level: Selective   Following Commands: Follows one step commands inconsistently Safety/Judgement: Decreased awareness of safety;Decreased awareness of deficits Awareness: Intellectual Problem Solving: Slow processing;Difficulty sequencing;Decreased initiation;Requires verbal cues;Requires tactile cues General Comments: Patient was accurate with time, place, and self.        Exercises     Shoulder Instructions       General Comments      Pertinent Vitals/ Pain  Pain Assessment: Faces Faces Pain Scale: Hurts little more Pain Location: RLE, thigh/hip Pain Descriptors / Indicators: Grimacing;Discomfort Pain Intervention(s): Repositioned  Home Living                                          Prior Functioning/Environment              Frequency  Min 2X/week        Progress Toward Goals  OT Goals(current goals can now be found in the care plan section)  Progress towards OT goals: Progressing toward  goals  Acute Rehab OT Goals Patient Stated Goal: unable OT Goal Formulation: Patient unable to participate in goal setting Time For Goal Achievement: 02/16/21 Potential to Achieve Goals: Fair ADL Goals Pt Will Perform Eating: with min assist Pt Will Perform Grooming: with min assist Pt Will Perform Upper Body Bathing: with mod assist Pt Will Transfer to Toilet: with min assist;stand pivot transfer;bedside commode Pt/caregiver will Perform Home Exercise Program: Increased ROM;Increased strength;Both right and left upper extremity Additional ADL Goal #1: Pt will be able to roll with min A to A with basic ADLs Additional ADL Goal #2: Continue to assess vision Additional ADL Goal #3: Pt will be able to maintain EOB balance with min A as a precursor to ADLs at [facility]  Plan Discharge plan remains appropriate    Co-evaluation                 AM-PAC OT "6 Clicks" Daily Activity     Outcome Measure   Help from another person eating meals?: Total Help from another person taking care of personal grooming?: A Lot Help from another person toileting, which includes using toliet, bedpan, or urinal?: Total Help from another person bathing (including washing, rinsing, drying)?: Total Help from another person to put on and taking off regular upper body clothing?: Total Help from another person to put on and taking off regular lower body clothing?: Total 6 Click Score: 7    End of Session    OT Visit Diagnosis: Unsteadiness on feet (R26.81);Other abnormalities of gait and mobility (R26.89);Muscle weakness (generalized) (M62.81);Other symptoms and signs involving cognitive function;Pain Pain - Right/Left: Right Pain - part of body: Hip   Activity Tolerance Patient limited by lethargy;Patient limited by fatigue   Patient Left in bed;with call bell/phone within reach;with bed alarm set   Nurse Communication Other (comment) (to response to therapy)        Time: 3559-7416 OT Time  Calculation (min): 24 min  Charges: OT General Charges $OT Visit: 1 Visit OT Treatments $Self Care/Home Management : 23-37 mins  Alfonse Flavors, OTA    Casimira Sutphin Jeannett Senior 02/07/2021, 10:22 AM

## 2021-02-07 NOTE — Plan of Care (Signed)

## 2021-02-07 NOTE — Progress Notes (Signed)
Physical Therapy Treatment Patient Details Name: Howard Rowland MRN: [id number] DOB: [birth year] Today's Date: 02/07/2021    History of Present Illness 30 yo admitted 8/30 after found unresponsive by bystander. Pt with hypotension and seizure activity in ED and intubated. Pt with acute renal failure, rhabdomyolysis and encephalopathy. 8/31 PTX with chest tube 8/31-9/3. 9/1 MRI suggestive of acute toxic leukoencephlalopathy. Extubated 9/2. PMhx: PTSD, depression, HIV, polysubstance abuse    PT Comments    Pt tolerated treatment well, although fatigued. Pt progressed to ambulation in room, requiring min guard for safety. Pt unsteady on feet when standing and ambulating, but requested less assist. Pt stated having pain in R hip and quad due to muscular weakness. Updated d/c recommendation to CIR, as pt stated having 24hr support and figuring out home situation. Will update as necessary.   HR 71-75 spO2 > 90% on RA    Follow Up Recommendations  CIR     Equipment Recommendations  Rolling walker with 5" wheels    Recommendations for Other Services       Precautions / Restrictions Precautions Precautions: Fall Precaution Comments:  Restrictions Weight Bearing Restrictions: No    Mobility  Bed Mobility Overal bed mobility: Needs Assistance Bed Mobility: Supine to Sit     General bed mobility comments: Min guard for safety. Performed with increased time and able to scoot hips to EOB without assist.    Transfers Overall transfer level: Needs assistance Equipment used: None Transfers: Sit to/from Stand Sit to Stand: Min guard         General transfer comment: Performed sit to stand x2 from bed and from toilet. Min guard on both trials for safety. Performed with increased time  Ambulation/Gait Ambulation/Gait assistance: Min assist;Min guard Gait Distance (Feet): 15 Feet   Gait Pattern/deviations: Step-to pattern;Decreased stride length Gait velocity: decreased Gait  velocity interpretation: <1.31 ft/sec, indicative of household ambulator General Gait Details: Ambulated in room with min A initially to steady, as pt demonstrated slight lateral leans when weight shifting and unsteadiness of foot. Pt requested less assist, therefore, min guard for rest of ambulation. Pt appeared slightly less unsteady with difficulty navigating obstacles independently.   Stairs             Wheelchair Mobility    Modified Rankin (Stroke Patients Only)       Balance Overall balance assessment: Needs assistance Sitting-balance support: Single extremity supported;Feet unsupported Sitting balance-Leahy Scale: Poor Sitting balance - Comments: Requires uni UE support in seated position to maintain balance Postural control: Left lateral lean;Posterior lean Standing balance support: No upper extremity supported;During functional activity Standing balance-Leahy Scale: Fair Standing balance comment: Pt able to maintain balance without support after having assist to steady. Pt demonstrates lateral weight shifts and difficulty navigating obstacles independently.                            Cognition Arousal/Alertness: Awake/alert Behavior During Therapy: Flat affect Overall Cognitive Status: No family/caregiver present to determine baseline cognitive functioning Area of Impairment: Attention      Current Attention Level: Selective   Following Commands: Follows one step commands consistently       Exercises General Exercises - Lower Extremity Ankle Circles/Pumps: AROM;Both;Seated;15 reps Long Arc Quad: AROM;Both;Seated;15 reps Hip Flexion/Marching: AROM;Both;10 reps;Seated    General Comments General comments (skin integrity, edema, etc.): VSS on RA HR 71-75 with spO2 > 90%      Pertinent Vitals/Pain Pain Assessment: Faces Pain Score:  4  Faces Pain Scale: Hurts little more Pain Location: right hip Pain Descriptors / Indicators:  Grimacing;Discomfort;Aching Pain Intervention(s): Limited activity within patient's tolerance;Monitored during session;Repositioned    Home Living Family/patient expects to be discharged to:: Shelter/Homeless (stays in hotels) Living Arrangements: Spouse/significant other Available Help at Discharge: Available 24 hours/day Type of Home: Other(Comment) (Hotels)         Additional Comments: Stays with girlfriend in hotels. States she is available 24/7    Prior Function Level of Independence: Independent          PT Goals (current goals can now be found in the care plan section) Acute Rehab PT Goals Patient Stated Goal: To get better PT Goal Formulation: With patient Time For Goal Achievement: 02/16/21 Potential to Achieve Goals: Good Progress towards PT goals: Progressing toward goals    Frequency    Min 3X/week      PT Plan Discharge plan needs to be updated    Co-evaluation              AM-PAC PT "6 Clicks" Mobility   Outcome Measure  Help needed turning from your back to your side while in a flat bed without using bedrails?: A Little Help needed moving from lying on your back to sitting on the side of a flat bed without using bedrails?: A Little Help needed moving to and from a bed to a chair (including a wheelchair)?: A Little Help needed standing up from a chair using your arms (e.g., wheelchair or bedside chair)?: A Little Help needed to walk in hospital room?: A Little Help needed climbing 3-5 steps with a railing? : A Lot 6 Click Score: 17    End of Session   Activity Tolerance: Patient tolerated treatment well;Patient limited by fatigue Patient left: in chair;with call bell/phone within reach;with chair alarm set Nurse Communication: Mobility status PT Visit Diagnosis: Other abnormalities of gait and mobility (R26.89);Muscle weakness (generalized) (M62.81);Other symptoms and signs involving the nervous system (R29.898);Pain;Unsteadiness on feet  (R26.81) Pain - Right/Left: Right Pain - part of body: Hip     Time: 8416-6063 PT Time Calculation (min) (ACUTE ONLY): 27 min  Charges:  $Gait Training: 8-22 mins $Therapeutic Exercise: 8-22 mins                     Velda Shell, SPT Acute Rehab: [phone]    Vance Gather 02/07/2021, 1:36 PM

## 2021-02-07 NOTE — Progress Notes (Signed)
PT Cancellation Note  Patient Details Name: Howard Rowland MRN: [id number] DOB: 05-28-1991   Cancelled Treatment:    Reason Eval/Treat Not Completed: Fatigue/lethargy limiting ability to participate (pt completing OT session and reports fatigue. will continue to attempt)   Rozella Servello B Margarit Minshall 02/07/2021, 10:07 AM Merryl Hacker, PT Acute Rehabilitation Services Pager: [phone] Office: [phone]

## 2021-02-07 NOTE — Progress Notes (Signed)
PROGRESS NOTE    Howard Rowland  JXB:[id number] DOB: 1990/08/23 DOA: 01/28/2021 PCP: Pcp, No   Brief Narrative:  The patient is a 30 year old male with a past medical history significant for but not limited to HIV admitted after he was found down in a hotel room presumed narcotic overdose.  Has had many recent visits to the ER with behavioral health involvement regarding depression.  He was admitted to ICU and developed acute respiratory failure due to aspiration pneumonia.  He is also noted to have a pneumothorax after central line was placed.  He had chest tube placed and was stabilized and transferred to the floor.  He continues to have altered mentation and there is also concern for questionable endocarditis.  Current significant events and procedures are as follows:  Significant events: 8/30 admitted after being found unresponsive 8/31 left pneumothorax 9/02 extubated, off CRRT 9/03 Lt chest tubed and CVL removed 9/04 transfer to progressive care   Procedures: 8/30 ETT > 9/02 8/30 R IJ HD cath > 9/03 8/30 L [place] CVL > 8/31 8/30 R femoral arterial line > 9/02 8/31 L IJ CVL > 9/3 8/31 L chest tube > 9/03   Studies: 8/30 CT angiogram chest > no PE, bilateral lower lobe infiltrates, considerable bilateral hilar and mediastinal adenopathy 8/31 TTE >LVEF 40-45%, dyskinesis of the left ventricular, basal inferolateral wall, mitral valve regurgitation and thickening, consider TEE, aortic valve with mild sclerosis 9/01 CT chest >> improved aeration at Rt lung base and LLL, decreased adenopathy, small volume Lt PTX with chest tube in place, mild subcutaneous emphysema 9/01 MRI brain >> 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  No seizure or epileptiform activity noted. 9/7 repeat TEE done and showed EF of 60-65% with undeterminable LVDF. No evidence of valvular vegetations on  this transthoracic echocardiogram. Would  recommend a  transesophageal echocardiogram to exclude infective endocarditis if clinically indicated. 9/8 Psych consulted   Patient's confusion is improving and he is not as agitated after being started on the Zyprexa. Nephrology, neurology and infectious disease as well as critical care been involved in patient's care.  Yesterday a.m. he was somewhat given that he received IV Ativan but he is now awake and alert and oriented and PT OT recommending CIR.  Psychiatry recommends continuing Zyprexa Zydis 5 mg p.o. twice daily for 3 days  Assessment & Plan:   Active Problems:   Respiratory failure (HCC)   Shock (HCC)   Status epilepticus (HCC)   Pneumothorax, left   HIV disease (HCC)   Altered mental status  Acute metabolic encephalopathy from hypoxia in the setting of accidental drug overdose ICU Delirium, improved -Patient was seen by neurology.  Underwent MRI and EEG.   -Patient apparently had seizure activity after respiratory arrest.   -EEG has not shown any epileptiform activity.  Patient however remains on Keppra. -Apparently serum paraneoplastic panel was sent out to [facility] [place] on 9/1.  These results are also pending. -Remains confused distracted and impulsive yesterday.   -Noted to be in restraints.   -Given his agitation the day before yesterday,  psychiatry had been consulted and they attempted to assess him but he was somnolent yesterday but he is more awake and alert now. Psych recommends starting Zyprexa Zydis 5 mg po BID for Psychosis for 3 days   Concern for viral meningitis/mitral valve thickening with concern for endocarditis -Patient with history of HIV.   -Underwent a lumbar puncture due to his acute  confusion.   -Was placed on broad-spectrum antibiotics.   -Currently he is noted to be on vancomycin and Unasyn.  Also noted to be on acyclovir.  CSF HSV IgM noted to be negative.   -Blood cultures were negative however transthoracic echocardiogram raise concern for mitral  valve thickening.  Discussed with infectious disease who would prefer to have a TEE to determine plan regarding antibiotics.   -Patient remains encephalopathic though it appears that he has been making some improvement.  We will wait for his mentation improves some more before pursuing TEE.  At his current mentation he will be unable to provide informed consent.  There is no next of kin or healthcare proxy available for decision-making.  In view of this and due to concern for endocarditis ID recommendation is to do 6 weeks of antibiotics with vancomycin and ceftriaxone.  Start date will be 8/30; social worker was able to track down patient's mother so we will need to discuss with her about obtaining a TEE however he did have a repeat TTE on 12/08/2020 with no concerns for vegetation bacteremia and endocarditis -ID now recommends discontinuing antibiotics and no need to get a TEE given the very low clinical suspicion for Endocardititis  -They are recommending continuing Biktarvy and get a 30-day supply for discharge [facility] -PT OT now recommending CIR  Aspiration pneumonia in the setting of HIV -Unasyn was initiated on August 31.  Has been changed over to ceftriaxone and now antibiotics have been discontinued.   -SpO2: 99 % O2 Flow Rate (L/min): 4 L/min FiO2 (%): 40 %; Respiratory status is stable.  No longer on oxygen.    -Will need an Ambulatory Home O2 Screen prior to D/C    Acute respiratory failure with hypoxia/pneumothorax -Developed left-sided pneumothorax after central line was placed.  Had a chest tube In ICU.  This has been removed.   -Respiratory status noted to be stable as above   History of depression/PTSD Apparently on Remeron and Lyrica prior to admission.  Resumed given his impulsiveness.  Consulted and psychiatry given that he remains in restraints and became combative yesterday -Given Ativan Early yesterday AM and was very somnolent but is  now improved significantly and back to baseline awake alert  Mediastinal Adenopathy Significance of this is unclear.  Might be reactive per pulmonology.  They recommend repeat imaging in a few weeks and then to determine if bronchoscopy with EBUS is needed but they do feel that he does need a EBUS to guide mediastinal fine-needle aspiration and patient wants to do it in the outpatient setting with GI.  PCCM feels that this is not some medically pressing to obtain currently   History of HIV -Remains on antiretroviral treatment.  Infectious disease has been following.  CD4 was 707 on 8/30.  Antiretroviral treatment modified by infectious disease and they are recommending discharging on Biktarvy.   Abnormal LFTs, improving  -Thought to be secondary to shock as well as acute illness.  LFTs improving gradually with AST being 43 and ALT now being 124.   -Continue to monitor and trend hepatic function panel and repeat intermittently   Acute kidney injury, resolved -Thought to be secondary to rhabdomyolysis and ATN in the setting of shock.   -Patient was on CRRT in the ICU.   -A dialysis catheter was placed.  Nephrology was following.  Urine output was monitored.  Renal function has improved.   -Dialysis was discontinued.  Dialysis catheter was removed.  Nephrology has signed off. -Renal function has normalized his last BUN/creatinine being 1 relatively stable 11/1.08 -Avoid further nephrotoxic medications, contrast dyes, hypotension and renally adjust medications -Repeat CMP in a.m.  Intermittent urinary retention -Patient was noted to be retaining urine.  Required catheterization.  -Bladder scans every shift.  -C/w Tamsulosins 0.4 mg po Daily.  Anemia of critical illness/mild thrombocytopenia -Platelet counts appear to have improved.  Hemoglobin noted to be stable. -Patient's platelet count is now 319 and his hemoglobin/hematocrit is stable at 11.0/33.8 -> 11.3/33.0 and slightly trended down to  10.4/31.7 -Continue to monitor for signs and symptoms bleeding; currently no overt bleeding noted  Dysphagia -Seen by speech therapy.  Cleared for regular diet.   Deconditioning -PT and OT evaluation happened and they are now recommending CIR again  DVT prophylaxis: Heparin 5,000 units sq q8h Code Status: FULL CODE  Family Communication: No family present at bedside  Disposition Plan: SNF vs CIR  Status is: Inpatient  Remains inpatient appropriate because:Unsafe d/c plan, IV treatments appropriate due to intensity of illness or inability to take PO, and Inpatient level of care appropriate due to severity of illness  Dispo:  Patient From: Home  Planned Disposition: Inpatient Rehab  Medically stable for discharge: No    Consultants:  PCCM Nephrology ID Neurology  Psychiatry   Procedures: As above  Antimicrobials:  Anti-infectives (From admission, onward)    Start     Dose/Rate Route Frequency Ordered Stop   02/04/21 1030  vancomycin (VANCOREADY) IVPB 750 mg/150 mL  Status:  Discontinued        750 mg 150 mL/hr over 60 Minutes Intravenous Every 8 hours 02/04/21 0930 02/05/21 1025   02/04/21 1030  cefTRIAXone (ROCEPHIN) 2 g in sodium chloride 0.9 % 100 mL IVPB  Status:  Discontinued        2 g 200 mL/hr over 30 Minutes Intravenous Every 24 hours 02/04/21 0930 02/05/21 1025   02/04/21 1015  bictegravir-emtricitabine-tenofovir AF (BIKTARVY) 50-200-25 MG per tablet 1 tablet        1 tablet Oral Daily 02/04/21 0915     02/03/21 0800  vancomycin (VANCOREADY) IVPB 500 mg/100 mL  Status:  Discontinued        500 mg 100 mL/hr over 60 Minutes Intravenous Every 8 hours 02/03/21 0444 02/03/21 0444   02/03/21 0800  vancomycin (VANCOREADY) IVPB 750 mg/150 mL  Status:  Discontinued        750 mg 150 mL/hr over 60 Minutes Intravenous Every 8 hours 02/03/21 0444 02/04/21 0914   02/03/21 0630  acyclovir (ZOVIRAX) 700 mg in dextrose 5 % 100 mL IVPB  Status:  Discontinued        700  mg 114 mL/hr over 60 Minutes Intravenous Every 8 hours 02/03/21 0458 02/04/21 0914   01/31/21 1600  Ampicillin-Sulbactam (UNASYN) 3 g in sodium chloride 0.9 % 100 mL IVPB  Status:  Discontinued        3 g 200 mL/hr over 30 Minutes Intravenous Every 8 hours 01/31/21 0918 01/31/21 1208   01/31/21 1500  vancomycin (VANCOREADY) IVPB 500 mg/100 mL  Status:  Discontinued        500 mg 100 mL/hr over 60 Minutes Intravenous Every 12 hours 01/31/21 1410 02/03/21 0443   01/31/21 1300  Ampicillin-Sulbactam (UNASYN) 3 g in sodium chloride 0.9 % 100 mL IVPB  Status:  Discontinued        3 g 200 mL/hr over 30 Minutes Intravenous Every 6 hours  01/31/21 1208 02/04/21 0914   01/31/21 1000  dolutegravir (TIVICAY) tablet 50 mg  Status:  Discontinued        50 mg Oral Daily 01/31/21 0804 02/04/21 0915   01/31/21 1000  emtricitabine-tenofovir AF (DESCOVY) 200-25 MG per tablet 1 tablet  Status:  Discontinued        1 tablet Oral Daily 01/31/21 0804 02/04/21 0915   01/30/21 1845  dolutegravir (TIVICAY) tablet 50 mg  Status:  Discontinued        50 mg Per Tube Daily 01/30/21 1748 01/31/21 0804   01/30/21 1845  emtricitabine-tenofovir AF (DESCOVY) 200-25 MG per tablet 1 tablet  Status:  Discontinued        1 tablet Per Tube Daily 01/30/21 1748 01/31/21 0804   01/29/21 1800  acyclovir (ZOVIRAX) 670 mg in dextrose 5 % 100 mL IVPB  Status:  Discontinued        10 mg/kg  67.1 kg 113.4 mL/hr over 60 Minutes Intravenous Every 24 hours 01/28/21 2150 02/03/21 0457   01/29/21 1800  vancomycin (VANCOREADY) IVPB 750 mg/150 mL  Status:  Discontinued        750 mg 150 mL/hr over 60 Minutes Intravenous Every 24 hours 01/28/21 2150 01/31/21 1410   01/29/21 1600  Ampicillin-Sulbactam (UNASYN) 3 g in sodium chloride 0.9 % 100 mL IVPB  Status:  Discontinued        3 g 200 mL/hr over 30 Minutes Intravenous Every 8 hours 01/29/21 1443 01/31/21 0918   01/29/21 1545  dolutegravir (TIVICAY) tablet 50 mg  Status:  Discontinued         50 mg Per Tube Daily 01/29/21 1445 01/30/21 0720   01/29/21 1545  emtricitabine-tenofovir AF (DESCOVY) 200-25 MG per tablet 1 tablet  Status:  Discontinued        1 tablet Per Tube Daily 01/29/21 1445 01/30/21 0720   01/29/21 0300  ceFEPIme (MAXIPIME) 2 g in sodium chloride 0.9 % 100 mL IVPB  Status:  Discontinued        2 g 200 mL/hr over 30 Minutes Intravenous Every 12 hours 01/28/21 1427 01/28/21 1432   01/28/21 2000  ceFEPIme (MAXIPIME) 2 g in sodium chloride 0.9 % 100 mL IVPB  Status:  Discontinued        2 g 200 mL/hr over 30 Minutes Intravenous Every 12 hours 01/28/21 1749 01/29/21 1407   01/28/21 1830  vancomycin (VANCOREADY) IVPB 1500 mg/300 mL        1,500 mg 150 mL/hr over 120 Minutes Intravenous  Once 01/28/21 1732 01/28/21 2022   01/28/21 1500  ceFEPIme (MAXIPIME) 2 g in sodium chloride 0.9 % 100 mL IVPB  Status:  Discontinued        2 g 200 mL/hr over 30 Minutes Intravenous Every 12 hours 01/28/21 1432 01/28/21 1749   01/28/21 1422  vancomycin variable dose per unstable renal function (pharmacist dosing)  Status:  Discontinued         Does not apply See admin instructions 01/28/21 1422 01/28/21 1732   01/28/21 1400  acyclovir (ZOVIRAX) 670 mg in dextrose 5 % 100 mL IVPB  Status:  Discontinued        10 mg/kg  67.1 kg 113.4 mL/hr over 60 Minutes Intravenous Every 12 hours 01/28/21 1338 01/28/21 2150   01/28/21 1300  ceFEPIme (MAXIPIME) 2 g in sodium chloride 0.9 % 100 mL IVPB  Status:  Discontinued        2 g 200 mL/hr over 30 Minutes Intravenous  Once  01/28/21 1253 01/28/21 1431   01/28/21 1300  ampicillin (OMNIPEN) 1 g in sodium chloride 0.9 % 100 mL IVPB  Status:  Discontinued        1 g 300 mL/hr over 20 Minutes Intravenous  Once 01/28/21 1255 01/28/21 1423   01/28/21 1200  cefTRIAXone (ROCEPHIN) 2 g in sodium chloride 0.9 % 100 mL IVPB  Status:  Discontinued        2 g 200 mL/hr over 30 Minutes Intravenous  Once 01/28/21 1158 01/28/21 1253   01/28/21 1200  vancomycin  (VANCOREADY) IVPB 1500 mg/300 mL  Status:  Discontinued        1,500 mg 150 mL/hr over 120 Minutes Intravenous  Once 01/28/21 1158 01/28/21 1732        Subjective: Seen and examined at bedside and he is much more awake and alert today and able to participate in examination and appears oriented.  PT OT working with him recommending CIR.  He had no complaints currently.  No other concerns or complaints at this time  Objective: Vitals:   02/07/21 0400 02/07/21 0800 02/07/21 1151 02/07/21 1211  BP: 133/80 125/68 (!) 111/58   Pulse:  81 84 78  Resp: 12 17    Temp: 99 F (37.2 C) 99.8 F (37.7 C) 98.9 F (37.2 C)   TempSrc: Oral Oral Oral   SpO2: 97% 97% 100% 99%  Weight:      Height:        Intake/Output Summary (Last 24 hours) at 02/07/2021 1537 Last data filed at 02/07/2021 0400 Gross per 24 hour  Intake 2037.34 ml  Output 1350 ml  Net 687.34 ml    Filed Weights   02/01/21 0426 02/02/21 0500 02/02/21 1700  Weight: 68 kg 66.7 kg 70.8 kg   Examination: Physical Exam:  Constitutional: WN/WD AAM in NAD and appears calm and comfortable Eyes: Lids and conjunctivae normal, sclerae anicteric  ENMT: External Ears, Nose appear normal. Grossly normal hearing.  Neck: Appears normal, supple, no cervical masses, normal ROM, no appreciable thyromegaly; No JVD Respiratory: Diminished to auscultation bilaterally, no wheezing, rales, rhonchi or crackles. Normal respiratory effort and patient is not tachypenic. No accessory muscle use.  Cardiovascular: RRR, no murmurs / rubs / gallops. S1 and S2 auscultated. No extremity edema. 2+ pedal pulses. No carotid bruits.  Abdomen: Soft, non-tender, non-distended. Bowel sounds positive.  GU: Deferred. Musculoskeletal: No clubbing / cyanosis of digits/nails. No joint deformity upper and lower extremities. Skin: No rashes, lesions, ulcers. No induration; Warm and dry.  Neurologic: CN 2-12 grossly intact with no focal deficits. Romberg sign and  cerebellar reflexes not assessed.  Psychiatric: Normal judgment and insight. Awake and Alert and oriented x 3. Normal mood and appropriate affect.   Data Reviewed: I have personally reviewed following labs and imaging studies  CBC: Recent Labs  Lab 02/03/21 0345 02/04/21 0038 02/05/21 0136 02/06/21 0352 02/07/21 0130  WBC 9.2 8.2 7.4 6.9 7.5  NEUTROABS  --   --   --  3.8 4.0  HGB 11.2* 11.8* 11.0* 11.3* 10.4*  HCT 33.7* 35.6* 33.8* 33.0* 31.7*  MCV 81.0 81.1 81.8 80.7 82.3  PLT 175 239 272 319 334    Basic Metabolic Panel: Recent Labs  Lab 02/01/21 0516 02/02/21 0633 02/03/21 0345 02/04/21 0038 02/05/21 0136 02/06/21 0352 02/07/21 0130  NA 139   < > 137 140 138 138 137  K 3.8   < > 4.0 3.9 3.7 3.7 3.7  CL 105   < >  105 109 108 108 106  CO2 28   < > 21* GLUCOSE 80   < > 98 97 91 100* 101*  BUN 11   < > CREATININE 1.06   < > 0.98 1.00 0.98 1.08 1.08  CALCIUM 8.0*   < > 8.5* 8.7* 9.0 8.9 9.0  MG 2.1  --   --   --   --  1.7 1.9  PHOS 3.4  --   --   --   --  4.2 4.3   < > = values in this interval not displayed.    GFR: Estimated Creatinine Clearance: 98.5 mL/min (by C-G formula based on SCr of 1.08 mg/dL). Liver Function Tests: Recent Labs  Lab 02/03/21 0345 02/04/21 0038 02/05/21 0136 02/06/21 0352 02/07/21 0130  AST 223* 134* 86* 54* 43*  ALT 359* 273* 210* 162* 124*  ALKPHOS 93 88 89 81 90  BILITOT 1.4* 0.8 1.0 0.8 0.8  PROT 6.2* 6.1* 6.0* 6.2* 5.8*  ALBUMIN 3.0* 2.9* 3.0* 3.0* 3.0*    No results for input(s): LIPASE, AMYLASE in the last 168 hours. No results for input(s): AMMONIA in the last 168 hours. Coagulation Profile: No results for input(s): INR, PROTIME in the last 168 hours. Cardiac Enzymes: No results for input(s): CKTOTAL, CKMB, CKMBINDEX, TROPONINI in the last 168 hours. BNP (last 3 results) No results for input(s): PROBNP in the last 8760 hours. HbA1C: No results for input(s): HGBA1C in the last 72  hours. CBG: Recent Labs  Lab 02/01/21 2306 02/02/21 0308 02/02/21 0707 02/02/21 1224 02/02/21 1639  GLUCAP 70 73 70 97 100*    Lipid Profile: No results for input(s): CHOL, HDL, LDLCALC, TRIG, CHOLHDL, LDLDIRECT in the last 72 hours. Thyroid Function Tests: No results for input(s): TSH, T4TOTAL, FREET4, T3FREE, THYROIDAB in the last 72 hours. Anemia Panel: No results for input(s): VITAMINB12, FOLATE, FERRITIN, TIBC, IRON, RETICCTPCT in the last 72 hours. Sepsis Labs: No results for input(s): PROCALCITON, LATICACIDVEN in the last 168 hours.   Recent Results (from the past 240 hour(s))  CSF culture w Gram Stain     Status: None   Collection Time: 01/28/21  5:24 PM   Specimen: CSF  Result Value Ref Range Status   Specimen Description CSF  Final   Special Requests CSF  Final   Gram Stain NO ORGANISMS SEEN CYTOSPIN SMEAR   Final   Culture   Final    NO GROWTH 3 DAYS Performed at [facility], [address], [place], [place] [address]    Report Status 02/01/2021 FINAL  Final  Culture, blood (routine x 2)     Status: None   Collection Time: 01/28/21  5:25 PM   Specimen: BLOOD  Result Value Ref Range Status   Specimen Description BLOOD LEFT ANTECUBITAL  Final   Special Requests   Final    BOTTLES DRAWN AEROBIC AND ANAEROBIC Blood Culture results may not be optimal due to an inadequate volume of blood received in culture bottles   Culture   Final    NO GROWTH 5 DAYS Performed at [facility], [address] [place], [place] [address]    Report Status 02/02/2021 FINAL  Final  Culture, Respiratory w Gram Stain (tracheal aspirate)     Status: None   Collection Time: 01/28/21 11:17 PM   Specimen: Tracheal Aspirate; Respiratory  Result Value Ref Range Status   Specimen Description TRACHEAL ASPIRATE  Final   Special Requests Immunocompromised  Final   Gram Stain   Final    FEW SQUAMOUS EPITHELIAL CELLS PRESENT ABUNDANT WBC PRESENT, PREDOMINANTLY PMN FEW GRAM  POSITIVE COCCI    Culture   Final    RARE Consistent with normal respiratory flora. No Pseudomonas species isolated Performed at [facility], [address] [place], [place] [address]    Report Status 01/31/2021 FINAL  Final  Culture, blood (routine x 2)     Status: None   Collection Time: 01/29/21  4:01 PM   Specimen: BLOOD  Result Value Ref Range Status   Specimen Description BLOOD LEFT ANTECUBITAL  Final   Special Requests   Final    BOTTLES DRAWN AEROBIC AND ANAEROBIC Blood Culture adequate volume   Culture   Final    NO GROWTH 5 DAYS Performed at [facility], [address] [place], [place] [address]    Report Status 02/03/2021 FINAL  Final  Culture, blood (routine x 2)     Status: None   Collection Time: 01/29/21  4:07 PM   Specimen: BLOOD LEFT HAND  Result Value Ref Range Status   Specimen Description BLOOD LEFT HAND  Final   Special Requests   Final    BOTTLES DRAWN AEROBIC ONLY Blood Culture adequate volume   Culture   Final    NO GROWTH 5 DAYS Performed at [facility], [address] [place], [place] [address]    Report Status 02/03/2021 FINAL  Final  Respiratory (~20 pathogens) panel by PCR     Status: None   Collection Time: 01/29/21  4:17 PM   Specimen: Nasopharyngeal Swab; Respiratory  Result Value Ref Range Status   Adenovirus NOT DETECTED NOT DETECTED Final   Coronavirus 229E NOT DETECTED NOT DETECTED Final    Comment: (NOTE) The Coronavirus on the Respiratory Panel, DOES NOT test for the novel  Coronavirus (2019 nCoV)    Coronavirus HKU1 NOT DETECTED NOT DETECTED Final   Coronavirus NL63 NOT DETECTED NOT DETECTED Final   Coronavirus OC43 NOT DETECTED NOT DETECTED Final   Metapneumovirus NOT DETECTED NOT DETECTED Final   Rhinovirus / Enterovirus NOT DETECTED NOT DETECTED Final   Influenza A NOT DETECTED NOT DETECTED Final   Influenza B NOT DETECTED NOT DETECTED Final   Parainfluenza Virus 1 NOT DETECTED NOT DETECTED Final    Parainfluenza Virus 2 NOT DETECTED NOT DETECTED Final   Parainfluenza Virus 3 NOT DETECTED NOT DETECTED Final   Parainfluenza Virus 4 NOT DETECTED NOT DETECTED Final   Respiratory Syncytial Virus NOT DETECTED NOT DETECTED Final   Bordetella pertussis NOT DETECTED NOT DETECTED Final   Bordetella Parapertussis NOT DETECTED NOT DETECTED Final   Chlamydophila pneumoniae NOT DETECTED NOT DETECTED Final   Mycoplasma pneumoniae NOT DETECTED NOT DETECTED Final    Comment: Performed at [facility], [address] [place], [place] [address]  Blastomyces Antigen     Status: None   Collection Time: 01/29/21  6:30 PM   Specimen: Blood  Result Value Ref Range Status   Blastomyces Antigen None Detected None Detected ng/mL Final    Comment: (NOTE) Results reported as ng/mL in 0.2 - 14.7 ng/mL range Results above the limit of detection but below 0.2 ng/mL are reported as 'Positive, Below the Limit of Quantification' Results above 14.7 ng/mL are reported as 'Positive, Above the Limit of Quantification'    Specimen Type SERUM  Final    Comment: (NOTE) Performed At:  [facility] [address] [place], [place] [id number] Phylis Bougie MD WU:[id number]   Georgana Curio, PCR     Status: None   Collection Time: 01/30/21  3:21 AM   Specimen: Blood  Result Value Ref Range Status   Toxoplasma Gondii, PCR Negative Negative Final    Comment: (NOTE) No Toxoplasma gondii DNA detected. This test was developed and its performance characteristics determined by [employer]. It has not been cleared or approved by the U.S. Food and Drug Administration. The FDA has determined that such clearance or approval is not necessary. This test is used for clinical purposes. It should not be regarded as investigational or research. Performed At: [facility] [address] [place], [place] [id number] Jolene Schimke MD VH:[id number]   MRSA Next Gen by PCR, Nasal     Status:  Abnormal   Collection Time: 01/30/21  8:22 AM   Specimen: Nasal Mucosa; Nasal Swab  Result Value Ref Range Status   MRSA by PCR Next Gen DETECTED (A) NOT DETECTED Final    Comment: RESULT CALLED TO, READ BACK BY AND VERIFIED WITH: RN J.GRANDOS AT [phone] ON 01/30/2021 BY T.SAAD. (NOTE) The GeneXpert MRSA Assay (FDA approved for NASAL specimens only), is one component of a comprehensive MRSA colonization surveillance program. It is not intended to diagnose MRSA infection nor to guide or monitor treatment for MRSA infections. Test performance is not FDA approved in patients less than 23 years old. Performed at [facility], [address] [address] [place], [place] [address]       RN Pressure Injury Documentation:     Estimated body mass index is 23.73 kg/m as calculated from the following:   Height as of this encounter:  (1.727 m).   Weight as of this encounter: 70.8 kg.  Malnutrition Type:  Nutrition Problem: Increased nutrient needs Etiology: acute illness (Renal failure requiring CRRT)  Malnutrition Characteristics:  Signs/Symptoms: estimated needs  Nutrition Interventions:  Interventions: Ensure Enlive (each supplement provides 350kcal and 20 grams of protein), Magic cup   Radiology Studies: No results found.  Scheduled Meds:  bictegravir-emtricitabine-tenofovir AF  1 tablet Oral Daily   Chlorhexidine Gluconate Cloth  6 each Topical Daily   cloNIDine  0.2 mg Transdermal Weekly   heparin injection (subcutaneous)  5,000 Units Subcutaneous Q8H   mirtazapine  45 mg Oral QHS   mupirocin ointment   Nasal BID   OLANZapine zydis  5 mg Oral BID   pregabalin  75 mg Oral BID   sodium chloride flush  10-40 mL Intracatheter Q12H   tamsulosin  0.4 mg Oral Daily   Continuous Infusions:  sodium chloride Stopped (02/06/21 1829)   dextrose 5 % and 0.9 % NaCl with KCl 20 mEq/L 50 mL/hr at 02/07/21 0849   levETIRAcetam 1,000 mg (02/07/21 0504)    LOS: 10 days   Merlene Laughter, DO [facility] PAGER is on AMION  If 7PM-7AM, please contact night-coverage [email or web address]

## 2021-02-07 NOTE — Consult Note (Signed)
The patient is a 30 year old male with a past medical history significant for but not limited to HIV admitted after he was found down in a hotel room presumed narcotic overdose.  Has had many recent visits to the ER with behavioral health involvement regarding depression.  He was admitted to ICU and developed acute respiratory failure due to aspiration pneumonia.  He is also noted to have a pneumothorax after central line was placed.  He had chest tube placed and was stabilized and transferred to the floor.  He continues to have altered mentation and there is also concern for questionable endocarditis.  Psych consult placed for agitation, aggression, and narcotic overdose. Patient was sedated yesterday, however today he is alert and oriented x 4. He is more coherent and engages well with this provider. He is no longer in restraints and does not appear to be disruptive. He appears to be agreeable to his treatment, and recently worked with physical therapy and nurse aid at the bedside. He denies any acute psychiatric concerns at this time. He further denies any paranoia, psychosis, or delusions at this time. He does not appear to be responding to internal stimuli, external stimuli and or delusional thinking disorder. He has been compliant with his medication, and denies any side effects at this time. He denies any suicidal ideation, homicidal ideation and or auditory visual hallucinations.   Patient appears to be responding well to medications that were initiated yesterday.   Agitated Delirium likely related to ICU admission: -EKG was obtained, which shows QTC of 385. -Continue  Zyprexa Zydis 5 mg p.o. twice daily for psychosis x 3 days.   -Psychiatry to sign off at this time.  Patient shows improvement and does not appear to be acutely psychotic or a danger to himself at this time.

## 2021-02-08 LAB — COMPREHENSIVE METABOLIC PANEL
ALT: 108 U/L — ABNORMAL HIGH (ref 0–44)
AST: 44 U/L — ABNORMAL HIGH (ref 15–41)
Albumin: 3.1 g/dL — ABNORMAL LOW (ref 3.5–5.0)
Alkaline Phosphatase: 91 U/L (ref 38–126)
Anion gap: 8 (ref 5–15)
BUN: 8 mg/dL (ref 6–20)
CO2: 24 mmol/L (ref 22–32)
Calcium: 9.3 mg/dL (ref 8.9–10.3)
Chloride: 106 mmol/L (ref 98–111)
Creatinine, Ser: 1.02 mg/dL (ref 0.61–1.24)
GFR, Estimated: >60 mL/min (ref >60)
Glucose, Bld: 113 mg/dL — ABNORMAL HIGH (ref 70–99)
Potassium: 4 mmol/L (ref 3.5–5.1)
Sodium: 138 mmol/L (ref 135–145)
Total Bilirubin: 0.2 mg/dL — ABNORMAL LOW (ref 0.3–1.2)
Total Protein: 5.9 g/dL — ABNORMAL LOW (ref 6.5–8.1)

## 2021-02-08 MED ORDER — CLONIDINE 0.2 MG/24HR TD PTWK: 0.2000 mg | MEDICATED_PATCH | TRANSDERMAL | 12 refills | Status: AC

## 2021-02-08 MED ORDER — MUPIROCIN 2 % EX OINT: TOPICAL_OINTMENT | Freq: Two times a day (BID) | CUTANEOUS | 0 refills | Status: AC

## 2021-02-08 MED ORDER — LEVETIRACETAM 1000 MG PO TABS: 1000.0000 mg | ORAL_TABLET | Freq: Two times a day (BID) | ORAL | 1 refills | Status: AC

## 2021-02-08 MED ORDER — TAMSULOSIN HCL 0.4 MG PO CAPS: 0.4000 mg | ORAL_CAPSULE | Freq: Every day | ORAL | 0 refills | Status: AC

## 2021-02-08 NOTE — Progress Notes (Signed)
Physical Therapy Treatment Patient Details Name: Howard Rowland MRN: [id number] DOB: 1991/01/09 Today's Date: 02/08/2021    History of Present Illness 30 yo admitted 8/30 after found unresponsive by bystander. Pt with hypotension and seizure activity in ED and intubated. Pt with acute renal failure, rhabdomyolysis and encephalopathy. 8/31 PTX with chest tube 8/31-9/3. 9/1 MRI suggestive of acute toxic leukoencephlalopathy. Extubated 9/2. PMhx: PTSD, depression, HIV, polysubstance abuse    PT Comments    Pt has made great improvements toward goals, still is not near his normal PLOF, showing weakness, stiffness and overall mild gait disturbance.  He is generally safe though not at age appropriate functional levels evidenced by DGI activity.  Also not quick of thought with deficits in complex thought processes.  He would benefit from  OPPT.   Follow Up Recommendations  Outpatient PT;Supervision - Intermittent     Equipment Recommendations  None recommended by PT    Recommendations for Other Services       Precautions / Restrictions Precautions Precautions: Fall Restrictions Weight Bearing Restrictions: No    Mobility  Bed Mobility Overal bed mobility: Independent                  Transfers Overall transfer level: Modified independent               General transfer comment: mild unsteadiness noted  Ambulation/Gait Ambulation/Gait assistance: Supervision Gait Distance (Feet): 500 Feet Assistive device: None Gait Pattern/deviations: Step-through pattern;Decreased step length - right;Decreased step length - left;Decreased stride length Gait velocity: decreased Gait velocity interpretation: 1.31 - 2.62 ft/sec, indicative of limited community ambulator General Gait Details: Safe and generally steady. Notably stiff, and perceptably antalgic on the R with variable stride length   Stairs Stairs: Yes Stairs assistance: Supervision Stair Management: One rail  Left;Alternating pattern;Step to pattern;Forwards Number of Stairs: 12 General stair comments: safe with rail.  Again, not totally fluid/steady with R LE, softly "stubbing" his toes ascending the steps.   step to coming down.   Wheelchair Mobility    Modified Rankin (Stroke Patients Only)       Balance Overall balance assessment: Needs assistance   Sitting balance-Leahy Scale: Good     Standing balance support: No upper extremity supported;During functional activity Standing balance-Leahy Scale: Good                   Standardized Balance Assessment Standardized Balance Assessment : Dynamic Gait Index   Dynamic Gait Index Level Surface: Mild Impairment Change in Gait Speed: Mild Impairment Gait with Horizontal Head Turns: Normal Gait with Vertical Head Turns: Normal Gait and Pivot Turn: Normal Step Over Obstacle: Mild Impairment Step Around Obstacles: Mild Impairment Steps: Mild Impairment Total Score: 19      Cognition Arousal/Alertness: Awake/alert Behavior During Therapy: Flat affect Overall Cognitive Status: Impaired/Different from baseline                   Orientation Level: Disoriented to;Situation Current Attention Level: Selective Memory: Decreased short-term memory Following Commands: Follows one step commands consistently;Follows multi-step commands consistently Safety/Judgement: Decreased awareness of safety;Decreased awareness of deficits Awareness: Emergent Problem Solving: Slow processing;Difficulty sequencing;Requires verbal cues General Comments: difficulty with complex thought and problem solving.      Exercises      General Comments General comments (skin integrity, edema, etc.): vss      Pertinent Vitals/Pain Pain Assessment: Faces Faces Pain Scale: Hurts a little bit Pain Location: R LE Pain Intervention(s): Monitored during session  Home Living                      Prior Function            PT  Goals (current goals can now be found in the care plan section) Acute Rehab PT Goals Patient Stated Goal: To get better PT Goal Formulation: With patient Time For Goal Achievement: 02/16/21 Potential to Achieve Goals: Good Progress towards PT goals: Progressing toward goals    Frequency    Min 3X/week      PT Plan Discharge plan needs to be updated    Co-evaluation              AM-PAC PT "6 Clicks" Mobility   Outcome Measure  Help needed turning from your back to your side while in a flat bed without using bedrails?: None Help needed moving from lying on your back to sitting on the side of a flat bed without using bedrails?: None Help needed moving to and from a bed to a chair (including a wheelchair)?: A Little Help needed standing up from a chair using your arms (e.g., wheelchair or bedside chair)?: None Help needed to walk in hospital room?: A Little Help needed climbing 3-5 steps with a railing? : A Little 6 Click Score: 21    End of Session   Activity Tolerance: Patient tolerated treatment well Patient left: in chair;with call bell/phone within reach Nurse Communication: Mobility status PT Visit Diagnosis: Other abnormalities of gait and mobility (R26.89)     Time: 4656-8127 PT Time Calculation (min) (ACUTE ONLY): 16 min  Charges:  $Gait Training: 8-22 mins                     02/08/2021  Jacinto Halim., PT Acute Rehabilitation Services [phone]  (pager) [phone]  (office)   Howard Rowland 02/08/2021, 12:03 PM

## 2021-02-08 NOTE — Progress Notes (Signed)
Page sent to Dr. Marland Mcalpine: Patient is asking to discharge home if able. This RN also asked if patient is able to downgrade status to Med Surg.   Dr. Marland Mcalpine with concerns regarding patient being agreeable to CIR placement, if he drives, and mobility. PT/ OT ordered by provider.   This RN followed up with patient. Patient is not agreeable to CIR placement. Patient would like to discharge home. Patient is ambulating independently to bathroom. Patient does not drive. Dr. Marland Mcalpine verbalized understanding of above information.

## 2021-02-08 NOTE — Progress Notes (Signed)
Nursing Discharge Note   Admit Date: 01/28/2021    Discharge date: 02/08/2021     Howard Rowland is  to be discharged home per MD order.  AVS completed. Reviewed with patient and significant other at bedside. Highlighted copy provided for patient to take home.  Patient/caregiver able to verbalize understanding of discharge instructions. PIV removed. Patient stable upon discharge.   Discharge Instructions   None     Allergies as of 02/08/2021       Reactions   Gabapentin Other (See Comments)   Stomach cramps   Haldol [haloperidol] Other (See Comments)   Dystonia   Naloxone Swelling, Other (See Comments)   Tongue swelling   Prednisone Swelling, Other (See Comments)   Pharyngeal swelling   Suboxone [buprenorphine Hcl-naloxone Hcl] Swelling, Other (See Comments)   Tongue swelling   Lurasidone Anxiety   Ziprasidone Hives        Medication List     TAKE these medications    bictegravir-emtricitabine-tenofovir AF 50-200-25 MG Tabs tablet Commonly known as: BIKTARVY Take 1 tablet by mouth daily.   cloNIDine 0.2 mg/24hr patch Commonly known as: CATAPRES - Dosed in mg/24 hr Place 1 patch (0.2 mg total) onto the skin once a week.   levETIRAcetam 1000 MG tablet Commonly known as: Keppra Take 1 tablet (1,000 mg total) by mouth 2 (two) times daily.   mirtazapine 45 MG tablet Commonly known as: REMERON Take 45 mg by mouth at bedtime. What changed: Another medication with the same name was removed. Continue taking this medication, and follow the directions you see here.   mupirocin ointment 2 % Commonly known as: BACTROBAN Place into the nose 2 (two) times daily.   pregabalin 75 MG capsule Commonly known as: LYRICA Take 75 mg by mouth 2 (two) times daily.   tamsulosin 0.4 MG Caps capsule Commonly known as: FLOMAX Take 1 capsule (0.4 mg total) by mouth daily.        Discharge Instructions/ Education: Discharge instructions given to patient/family with verbalized  understanding. Discharge education completed with patient/family including: follow up instructions, medication list, discharge activities, and limitations if indicated. Additional discharge instructions as indicated by discharging provider also reviewed. Patient and family able to verbalize understanding, all questions fully answered. Patient instructed to return to Emergency Department, call 911, or call MD for any changes in condition.  Patient is ambulatory and declined wheelchair escort to lobby for discharge. Escorted by NT to lobby for discharge home.

## 2021-02-08 NOTE — Plan of Care (Signed)

## 2021-02-08 NOTE — Discharge Summary (Addendum)
Physician Discharge Summary  Howard Rowland ZOX:[id number] DOB: 06-02-1990 DOA: 01/28/2021  PCP: Oneita Hurt, No  Admit date: 01/28/2021 Discharge date: 02/08/2021  Admitted From: Home Disposition: Home with outpatient PT/OT with 24/hr supervision  Recommendations for Outpatient Follow-up:  Follow up with PCP in 1-2 weeks Follow up with Neurology within 1-2 weeks Follow up with Psychiatry in the outpatient setting  Follow up with ID within 1 month Follow up as an outpatient with Pulmonary for potential EBUS and repeat imaging for mediastinal adenopathy within a few weeks Please obtain CMP/CBC, Mag, Phos in one week Please follow up on the following pending results:  Home Health: No  Equipment/Devices: None    Discharge Condition: Stable  CODE STATUS: FULL CODE   Diet recommendation: Regular Diet  Brief/Interim Summary: The patient is a 30 year old male with a past medical history significant for but not limited to HIV admitted after he was found down in a hotel room presumed narcotic overdose.  Has had many recent visits to the ER with behavioral health involvement regarding depression.  He was admitted to ICU and developed acute respiratory failure due to aspiration pneumonia.  He is also noted to have a pneumothorax after central line was placed.  He had chest tube placed and was stabilized and transferred to the floor.  He continues to have altered mentation and there is also concern for questionable endocarditis.  Current significant events and procedures are as follows:   Significant events: 8/30 admitted after being found unresponsive 8/31 left pneumothorax 9/02 extubated, off CRRT 9/03 Lt chest tubed and CVL removed 9/04 transfer to progressive care   Procedures: 8/30 ETT > 9/02 8/30 R IJ HD cath > 9/03 8/30 L [place] CVL > 8/31 8/30 R femoral arterial line > 9/02 8/31 L IJ CVL > 9/3 8/31 L chest tube > 9/03   Studies: 8/30 CT angiogram chest > no PE, bilateral lower lobe  infiltrates, considerable bilateral hilar and mediastinal adenopathy 8/31 TTE >LVEF 40-45%, dyskinesis of the left ventricular, basal inferolateral wall, mitral valve regurgitation and thickening, consider TEE, aortic valve with mild sclerosis 9/01 CT chest >> improved aeration at Rt lung base and LLL, decreased adenopathy, small volume Lt PTX with chest tube in place, mild subcutaneous emphysema 9/01 MRI brain >> 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  No seizure or epileptiform activity noted. 9/7 repeat TEE done and showed EF of 60-65% with undeterminable LVDF. No evidence of valvular vegetations on  this transthoracic echocardiogram. Would recommend a  transesophageal echocardiogram to exclude infective endocarditis if clinically indicated. 9/8 Psych consulted    Patient's confusion is improving and he is not as agitated after being started on the Zyprexa. Nephrology, neurology and infectious disease as well as critical care been involved in patient's care.  Yesterday a.m. he was somewhat given that he received IV Ativan but he is now awake and alert and oriented and PT OT recommending CIR.  Psychiatry recommends continuing Zyprexa Zydis 5 mg p.o. twice daily for 3 days  Symptoms significantly improved and his confusion and agitation was resolved.  He is back to baseline and was alert and oriented x4.  PT OT recommended outpatient OT and PT and he was instructed not to drive.  Patient was deemed medically stable to be discharged at this time and will need to follow-up with his PCP, infectious disease physician, pulmonologist in outpatient setting for EBUS.  Patient at the time of discharge was independently walking and  alert and awake and joking.  All questions were answered to his satisfaction and he was instructed not to drive given [facility] state law  Discharge Diagnoses:  Active Problems:   Respiratory failure  (HCC)   Shock (HCC)   Status epilepticus (HCC)   Pneumothorax, left   HIV disease (HCC)   Altered mental status  Acute metabolic encephalopathy from hypoxia in the setting of accidental drug overdose ICU Delirium, improved -Patient was seen by neurology.  Underwent MRI and EEG.   -Patient apparently had seizure activity after respiratory arrest.   -EEG has not shown any epileptiform activity.  Patient however remains on Keppra and this has been changed to p.o. and he is told not to drive per [facility] law as below -Apparently serum paraneoplastic panel was sent out to [facility] on 9/1.  These results are also pending. -Remains confused distracted and impulsive yesterday.   -Noted to be in restraints.   -Given his agitation the day before yesterday,  psychiatry had been consulted and they attempted to assess him but he was somnolent yesterday but he is more awake and alert now. Psych recommends starting Zyprexa Zydis 5 mg po BID for Psychosis for 3 days and this is being completed -SEIZURE PRECAUTIONS Per [facility] statutes, patients with seizures are not allowed to drive until they have been seizure-free for six months.   Use caution when using heavy equipment or power tools. Avoid working on ladders or at heights. Take showers instead of baths. Ensure the water temperature is not too high on the home water heater. Do not go swimming alone. Do not lock yourself in a room alone (i.e. bathroom). When caring for infants or small children, sit down when holding, feeding, or changing them to minimize risk of injury to the child in the event you have a seizure. Maintain good sleep hygiene. Avoid alcohol.    If patient has another seizure, call 911 and bring them back to the ED if: A.  The seizure lasts longer than 5 minutes.      B.  The patient doesn't wake shortly after the seizure or has new problems such as difficulty seeing, speaking or moving following the seizure C.   The patient was injured during the seizure D.  The patient has a temperature over 102 F (39C) E.  The patient vomited during the seizure and now is having trouble breathing  -Follow-up with neurology in outpatient setting   Concern for viral meningitis/mitral valve thickening with concern for endocarditis -Patient with history of HIV.   -Underwent a lumbar puncture due to his acute confusion.   -Was placed on broad-spectrum antibiotics.   -Currently he is noted to be on vancomycin and Unasyn.  Also noted to be on acyclovir.  CSF HSV IgM noted to be negative.   -Blood cultures were negative however transthoracic echocardiogram raise concern for mitral valve thickening.  Discussed with infectious disease who would prefer to have a TEE to determine plan regarding antibiotics.   -Patient remains encephalopathic though it appears that he has been making some improvement.  We will wait for his mentation improves some more before pursuing TEE.  At his current mentation he will be unable to provide informed consent.  There is no next of kin or healthcare proxy available for decision-making.  In view of this and due to concern for endocarditis ID recommendation is to do 6 weeks of antibiotics with vancomycin and ceftriaxone.  Start date will be  8/30; social worker was able to track down patient's mother so we will need to discuss with her about obtaining a TEE however he did have a repeat TTE on 12/08/2020 with no concerns for vegetation bacteremia and endocarditis -ID now recommends discontinuing antibiotics and no need to get a TEE given the very low clinical suspicion for Endocardititis  -They are recommending continuing Biktarvy and get a 30-day supply for discharge as [facility] -PT OT now recommending CIR however upon reevaluation they are recommending outpatient PT OT   Aspiration pneumonia in the setting of HIV -Unasyn was initiated on August 31.  Has been changed over  to ceftriaxone and now antibiotics have been discontinued.   -SpO2: 99 % O2 Flow Rate (L/min): 4 L/min FiO2 (%): 40 %; Respiratory status is stable.  No longer on oxygen.    -Will need an Ambulatory Home O2 Screen prior to D/C and he did not desaturate   Acute respiratory failure with hypoxia/pneumothorax -Developed left-sided pneumothorax after central line was placed.  Had a chest tube In ICU.  This has been removed.   -Respiratory status noted to be stable as above and he will follow-up with pulm in outpatient setting   History of depression/PTSD Apparently on Remeron and Lyrica prior to admission.  Resumed given his impulsiveness.  Consulted and psychiatry given that he remains in restraints and became combative yesterday -Is given Ativan the day before yesterday and was very somnolent but is now significantly improved back to baseline after he started on Zyprexa -He is awake and alert and not suicidal or homicidal and was joking.  He has been deemed stable to be discharged and follow-up with outpatient psychiatry and he is not a danger to himself and able to make decisions for himself now  Mediastinal Adenopathy Significance of this is unclear.  Might be reactive per pulmonology.  They recommend repeat imaging in a few weeks and then to determine if bronchoscopy with EBUS is needed but they do feel that he does need a EBUS to guide mediastinal fine-needle aspiration and patient wants to do it in the outpatient setting with GI.  PCCM feels that this is not some medically pressing to obtain currently -Follow-up with pulmonary in outpatient setting for his mediastinal adenopathy   History of HIV -Remains on antiretroviral treatment.  Infectious disease has been following.  CD4 was 707 on 8/30.  Antiretroviral treatment modified by infectious disease and they are recommending discharging on Biktarvy. -Patient has Biktarvy at discharge already so we will follow-up with ID as an outpatient    Abnormal LFTs, improving  -Thought to be secondary to shock as well as acute illness.  LFTs improving gradually with AST being 44 and ALT now being 108.   -Continue to monitor and trend hepatic function panel and repeat intermittently and repeat within 1 week   Acute kidney injury, resolved -Thought to be secondary to rhabdomyolysis and ATN in the setting of shock.   -Patient was on CRRT in the ICU.   -A dialysis catheter was placed.  Nephrology was following.  Urine output was monitored.  Renal function has improved.   -Dialysis was discontinued.  Dialysis catheter was removed.  Nephrology has signed off. -Renal function has normalized his last BUN/creatinine was relatively stable at 8/1.02 -Avoid further nephrotoxic medications, contrast dyes, hypotension and renally adjust medications -Repeat CMP within 1 week  Intermittent urinary retention -Patient was noted to be retaining urine.  Required  catheterization.  -Bladder scans every shift.  -C/w Tamsulosins 0.4 mg po Daily at discharge and follow-up with PCP to see if he will continue tamsulosin  Anemia of critical illness/mild thrombocytopenia -Platelet counts appear to have improved.  Hemoglobin noted to be stable. -Patient's platelet count is now 319 and his hemoglobin/hematocrit is stable at 11.0/33.8 -> 11.3/33.0 and slightly trended down to 10.4/31.7 on last check -Continue to monitor for signs and symptoms bleeding; currently no overt bleeding noted -Repeat CBC within 1 week  Dysphagia -Seen by speech therapy.  Cleared for regular diet.   Deconditioning -PT and OT evaluation happened and they are now recommending CIR again yesterday but upon reevaluation today they are recommending outpatient PT OT with supervision  Discharge Instructions  Discharge Instructions     Ambulatory referral to Occupational Therapy   Complete by: As directed    Ambulatory referral to Physical Therapy   Complete by: As directed    Call MD for:   difficulty breathing, headache or visual disturbances   Complete by: As directed    Call MD for:  extreme fatigue   Complete by: As directed    Call MD for:  hives   Complete by: As directed    Call MD for:  persistant dizziness or light-headedness   Complete by: As directed    Call MD for:  persistant nausea and vomiting   Complete by: As directed    Call MD for:  redness, tenderness, or signs of infection (pain, swelling, redness, odor or green/yellow discharge around incision site)   Complete by: As directed    Call MD for:  severe uncontrolled pain   Complete by: As directed    Call MD for:  temperature >100.4   Complete by: As directed    Diet - low sodium heart healthy   Complete by: As directed    Discharge instructions   Complete by: As directed    You were cared for by a hospitalist during your hospital stay. If you have any questions about your discharge medications or the care you received while you were in the hospital after you are discharged, you can call the unit and ask to speak with the hospitalist on call if the hospitalist that took care of you is not available. Once you are discharged, your primary care physician will handle any further medical issues. Please note that NO REFILLS for any discharge medications will be authorized once you are discharged, as it is imperative that you return to your primary care physician (or establish a relationship with a primary care physician if you do not have one) for your aftercare needs so that they can reassess your need for medications and monitor your lab values.  Follow up with PCP, ID, and Neurology. Take all medications as prescribed. If symptoms change or worsen please return to the ED for evaluation   Driving Restrictions   Complete by: As directed    SEIZURE PRECAUTIONS Per [facility] statutes, patients with seizures are not allowed to drive until they have been seizure-free for six months.   Use caution when  using heavy equipment or power tools. Avoid working on ladders or at heights. Take showers instead of baths. Ensure the water temperature is not too high on the home water heater. Do not go swimming alone. Do not lock yourself in a room alone (i.e. bathroom). When caring for infants or small children, sit down when holding, feeding, or changing them to minimize risk of injury  to the child in the event you have a seizure. Maintain good sleep hygiene. Avoid alcohol.    If patient has another seizure, call 911 and bring them back to the ED if: A.  The seizure lasts longer than 5 minutes.      B.  The patient doesn't wake shortly after the seizure or has new problems such as difficulty seeing, speaking or moving following the seizure C.  The patient was injured during the seizure D.  The patient has a temperature over 102 F (39C) E.  The patient vomited during the seizure and now is having trouble breathing   Increase activity slowly   Complete by: As directed       Allergies as of 02/08/2021       Reactions   Gabapentin Other (See Comments)   Stomach cramps   Haldol [haloperidol] Other (See Comments)   Dystonia   Naloxone Swelling, Other (See Comments)   Tongue swelling   Prednisone Swelling, Other (See Comments)   Pharyngeal swelling   Suboxone [buprenorphine Hcl-naloxone Hcl] Swelling, Other (See Comments)   Tongue swelling   Lurasidone Anxiety   Ziprasidone Hives        Medication List     TAKE these medications    bictegravir-emtricitabine-tenofovir AF 50-200-25 MG Tabs tablet Commonly known as: BIKTARVY Take 1 tablet by mouth daily.   cloNIDine 0.2 mg/24hr patch Commonly known as: CATAPRES - Dosed in mg/24 hr Place 1 patch (0.2 mg total) onto the skin once a week.   levETIRAcetam 1000 MG tablet Commonly known as: Keppra Take 1 tablet (1,000 mg total) by mouth 2 (two) times daily.   mirtazapine 45 MG tablet Commonly known as: REMERON Take 45 mg by mouth at  bedtime. What changed: Another medication with the same name was removed. Continue taking this medication, and follow the directions you see here.   mupirocin ointment 2 % Commonly known as: BACTROBAN Place into the nose 2 (two) times daily.   pregabalin 75 MG capsule Commonly known as: LYRICA Take 75 mg by mouth 2 (two) times daily.   tamsulosin 0.4 MG Caps capsule Commonly known as: FLOMAX Take 1 capsule (0.4 mg total) by mouth daily.        Allergies  Allergen Reactions   Gabapentin Other (See Comments)    Stomach cramps   Haldol [Haloperidol] Other (See Comments)    Dystonia   Naloxone Swelling and Other (See Comments)    Tongue swelling   Prednisone Swelling and Other (See Comments)    Pharyngeal swelling   Suboxone [Buprenorphine Hcl-Naloxone Hcl] Swelling and Other (See Comments)    Tongue swelling   Lurasidone Anxiety   Ziprasidone Hives    Consultations: PCCM Nephrology ID Neurology  Psychiatry   Procedures/Studies: DG Abd 1 View  Result Date: 01/28/2021 CLINICAL DATA:  Orogastric tube placement. EXAM: ABDOMEN - 1 VIEW COMPARISON:  Same day. FINDINGS: The bowel gas pattern is normal. Distal tip of nasogastric tube is seen in proximal stomach. No radio-opaque calculi or other significant radiographic abnormality are seen. IMPRESSION: Distal tip of nasogastric tube is seen in proximal stomach. Electronically Signed   By: Lupita Raider M.D.   On: 01/28/2021 16:18   DG Abdomen 1 View  Result Date: 01/28/2021 CLINICAL DATA:  Check gastric catheter placement EXAM: ABDOMEN - 1 VIEW COMPARISON:  None. FINDINGS: Gastric catheter has been advanced following the chest x-ray with the tip in the stomach. The proximal side port lies in the distal esophagus  however and should be advanced deeper into the stomach. IMPRESSION: Gastric catheter as described. This should be advanced deeper into the stomach. Electronically Signed   By: Alcide Clever M.D.   On: 01/28/2021 12:09    CT HEAD WO CONTRAST ( )  Result Date: 01/28/2021 CLINICAL DATA:  Seizure, nontraumatic EXAM: CT HEAD WITHOUT CONTRAST TECHNIQUE: Contiguous axial images were obtained from the base of the skull through the vertex without intravenous contrast. COMPARISON:  None. FINDINGS: Brain: There is no acute intracranial hemorrhage, mass effect, or edema. Gray-white differentiation is preserved. There is no extra-axial fluid collection. Ventricles and sulci are within normal limits in size and configuration. Vascular: No hyperdense vessel or unexpected calcification. Skull: Calvarium is unremarkable. Sinuses/Orbits: Lobular right maxillary sinus mucosal thickening. Other: Mastoid air cells are clear. Age-indeterminate left nasal bone fracture. IMPRESSION: No acute intracranial abnormality. Age-indeterminate left nasal bone fracture. Electronically Signed   By: Guadlupe Spanish M.D.   On: 01/28/2021 11:46   CT CHEST WO CONTRAST  Result Date: 01/30/2021 CLINICAL DATA:  Foreign body suspected, chest EXAM: CT CHEST WITHOUT CONTRAST TECHNIQUE: Multidetector CT imaging of the chest was performed following the standard protocol without IV contrast. COMPARISON:  Recent chest CT and chest radiographs FINDINGS: Cardiovascular: Partially imaged right IJ central line extending into the SVC. Partially imaged presumed left IJ central line likely extending into the azygous vein. There is no retained catheter fragment identified. Normal heart size. No pericardial effusion. Thoracic aorta is normal in caliber. Mediastinum/Nodes: Endotracheal and enteric tubes are present. Mediastinal and hilar adenopathy is present though better evaluated on the prior contrast study. Top-normal axillary nodes as well. Lungs/Pleura: Improved aeration at the right lung base with persistent dependent right lower lobe atelectasis/consolidation. Slightly increased left lower lobe atelectasis/consolidation. Small volume left pneumothorax along the medial  pleural margin. A left chest tube is present laterally. Upper Abdomen: No acute abnormality. Musculoskeletal: Minimal subcutaneous emphysema related to chest tube placement. IMPRESSION: Lines and tubes as above. There is no retained catheter fragment identified. Improved aeration at the right lung base with persistent right lower lobe atelectasis/consolidation. Slightly increased left lower lobe atelectasis/consolidation. Small volume left pneumothorax along the medial pleural margin. A left chest tube is present laterally. Electronically Signed   By: Guadlupe Spanish M.D.   On: 01/30/2021 12:43   CT Angio Chest PE W and/or Wo Contrast  Result Date: 01/28/2021 CLINICAL DATA:  Found unresponsive EXAM: CT ANGIOGRAPHY CHEST WITH CONTRAST TECHNIQUE: Multidetector CT imaging of the chest was performed using the standard protocol during bolus administration of intravenous contrast. Multiplanar CT image reconstructions and MIPs were obtained to evaluate the vascular anatomy. CONTRAST:  65mL OMNIPAQUE IOHEXOL 350 MG/ML SOLN COMPARISON:  Chest x-ray from earlier in the same day. FINDINGS: Cardiovascular: Thoracic aorta is not significantly dilated although opacification is significantly limited. Pulmonary artery shows a normal branching pattern bilaterally. No definitive filling defects are identified to suggest pulmonary embolism. Mediastinum/Nodes: Thoracic inlet is within normal limits. There are changes consistent with mediastinal and hilar adenopathy similar to that seen on recent chest x-ray. The right paratracheal adenopathy measures 2.1 cm in short axis. AP window node measures 15 mm in short axis. The hilar adenopathy is confluent and significantly enlarged in size. Subcarinal adenopathy is difficult to evaluate due to lack of contrast within the left atrium. The esophagus as visualized is within normal limits. Scattered small axillary nodes are noted. Endotracheal tube is noted 2 cm above the carina. Gastric  catheter extends the stomach. Lungs/Pleura: Lungs  are well aerated bilaterally with the exception of the lower lobes which demonstrate mild-to-moderate consolidation right slightly greater than left. No sizable effusion is noted. Some patchy infiltrative changes are noted in the right middle and right upper lobe but to a lesser degree. Upper Abdomen: Visualized upper abdomen shows the spleen to be within normal limits. No definitive adenopathy is noted. Musculoskeletal: No acute bony abnormality is noted. Review of the MIP images confirms the above findings. IMPRESSION: No evidence of pulmonary emboli. Bilateral lower lobe as well as right upper and middle lobe infiltrative densities consistent with multifocal pneumonia. Considerable bilateral hilar and mediastinal adenopathy. These changes could be related to underlying sarcoidosis although lymphoma deserves consideration as well. Tissue sampling should be considered. Electronically Signed   By: Alcide Clever M.D.   On: 01/28/2021 13:39   CT Cervical Spine Wo Contrast  Result Date: 01/28/2021 CLINICAL DATA:  Head trauma, minor, normal mental status; seizure EXAM: CT CERVICAL SPINE WITHOUT CONTRAST TECHNIQUE: Multidetector CT imaging of the cervical spine was performed without intravenous contrast. Multiplanar CT image reconstructions were also generated. COMPARISON:  None. FINDINGS: Alignment: No significant listhesis. Skull base and vertebrae: No acute fracture. Vertebral body heights are maintained. Soft tissues and spinal canal: No prevertebral fluid or swelling. No visible canal hematoma. Disc levels:  Intervertebral disc heights are maintained. Upper chest: Unremarkable Other: None. IMPRESSION: No acute cervical spine fracture. Electronically Signed   By: Guadlupe Spanish M.D.   On: 01/28/2021 11:48   MR BRAIN W WO CONTRAST  Result Date: 01/30/2021 CLINICAL DATA:  Altered mental status, CNS infection in the setting of seizure and HIV medication  noncompliance EXAM: MRI HEAD WITHOUT AND WITH CONTRAST TECHNIQUE: Multiplanar, multiecho pulse sequences of the brain and surrounding structures were obtained without and with intravenous contrast. CONTRAST:  109mL GADAVIST GADOBUTROL 1 MMOL/ML IV SOLN COMPARISON:  CT head 01/28/2021 FINDINGS: Brain: There is confluent FLAIR signal abnormality throughout the subcortical white matter bilaterally in a symmetric distribution. The subcortical U fibers are spared. There is associated diffusion restriction. There is no associated enhancement. There is no intracranial hemorrhage. There is no mass effect or midline shift. Vascular: Normal flow voids. Skull and upper cervical spine: Normal marrow signal. Sinuses/Orbits: Negative. Other: None. IMPRESSION: Extensive symmetric T2/FLAIR signal abnormality throughout the supratentorial white matter described above with associated diffusion restriction but no postcontrast enhancement. 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 Electronically Signed   By: Lesia Hausen M.D.   On: 01/30/2021 13:54   DG Chest Port 1 View  Result Date: 02/01/2021 CLINICAL DATA:  Status post chest tube removal. EXAM: PORTABLE CHEST 1 VIEW COMPARISON:  Chest radiograph February 01, 2021. FINDINGS: Interval removal right IJ central venous catheter and left chest tube. Left IJ central venous catheter tip projects over the superior vena cava, stable. Stable cardiac and mediastinal contours. No consolidative pulmonary opacities. No pleural effusion or pneumothorax. IMPRESSION: Interval removal left chest tube.  No definite pneumothorax. Electronically Signed   By: Annia Belt M.D.   On: 02/01/2021 16:35   DG Chest Port 1 View  Result Date: 02/01/2021 CLINICAL DATA:  Follow-up pneumothorax EXAM: PORTABLE CHEST 1 VIEW COMPARISON:  Chest radiograph from one day prior. FINDINGS: Interval extubation. Stable right and left internal jugular  central venous catheters. Stable pigtail left chest tube. Stable cardiomediastinal silhouette with top-normal heart size. No pneumothorax. No pleural effusion. No pulmonary edema. Mild scarring versus atelectasis at the left costophrenic angle. No acute  consolidative airspace disease. IMPRESSION: 1. No pneumothorax. Stable left chest tube. 2. Mild scarring versus atelectasis at the left costophrenic angle. Electronically Signed   By: Delbert Phenix M.D.   On: 02/01/2021 06:19   DG Chest Port 1 View  Result Date: 01/31/2021 CLINICAL DATA:  Acute respiratory failure. EXAM: PORTABLE CHEST 1 VIEW COMPARISON:  01/29/2021 FINDINGS: Endotracheal tube tip is 6.3 cm above the base of the carina. The NG tube passes into the stomach although the distal tip position is not included on the film. Left pleural drain remains in place with no residual left-sided pneumothorax visible on the current study. Left IJ central line tip overlies the proximal SVC level. Right IJ central line tip overlies the mid SVC level. The right lung clear. The cardiopericardial silhouette is within normal limits for size. Telemetry leads overlie the chest. IMPRESSION: 1. Tiny apical left pneumothorax seen previously is not visible on the current study. 2. Otherwise no substantial interval change. Electronically Signed   By: Kennith Center M.D.   On: 01/31/2021 05:05   DG CHEST PORT 1 VIEW  Result Date: 01/29/2021 CLINICAL DATA:  Chest tube placement EXAM: PORTABLE CHEST 1 VIEW COMPARISON:  01/29/2021 at 1129 hours FINDINGS: Interval placement of a left sided pigtail pleural drainage catheter. Endotracheal tube positioned within the midthoracic trachea. Left and right internal jugular approach central venous catheters are stable in positioning terminating at the level of the distal SVC. An enteric tube courses below the diaphragm, distal tip beyond the inferior margin of the film. Stable heart size. Interval reduction in size of left-sided  pneumothorax. Trace left apical pneumothorax remains, less than 5% size. Slightly improved aeration of the lung fields. Bilateral hilar lymphadenopathy. IMPRESSION: 1. Interval placement of left-sided chest tube with reduction in size of left-sided pneumothorax. Trace left apical pneumothorax remains, less than 5% size. 2. Improving aeration of the lung fields. Otherwise stable radiographic appearance of the chest. 3. Lines and tubes as above. Electronically Signed   By: Duanne Guess D.O.   On: 01/29/2021 14:45   DG CHEST PORT 1 VIEW  Result Date: 01/29/2021 CLINICAL DATA:  30 year old male status post new central line placement. Unresponsive patient. EXAM: PORTABLE CHEST 1 VIEW COMPARISON:  Chest x-ray 01/28/2021. FINDINGS: New left internal jugular central venous catheter with tip projecting over the distal superior vena cava. There is a left-sided subclavian central venous catheter with tip terminating in the proximal to mid superior vena cava. An endotracheal tube is in place with tip 6.9 cm above the carina. Right IJ Vas-Cath with tip terminating in the distal superior vena cava an additional small bore catheter or fragment of a catheter is noted projecting over the upper mediastinum measuring approximately 2 cm in length. A nasogastric tube is seen extending into the stomach, however, the tip of the nasogastric tube extends below the lower margin of the image. New large left-sided pneumothorax occupying greater than 50% of the volume of the left hemithorax. Patchy ill-defined opacities and areas of interstitial prominence throughout the right mid to lower lung. No right pneumothorax. No evidence of pulmonary edema. Heart size is borderline enlarged. Upper mediastinal contours are within normal limits. IMPRESSION: 1. New large left-sided pneumothorax following placement of left IJ central venous catheter. 2. Additional support apparatus, as above. Please take note of the apparent catheter fragment  projecting over the upper mediastinum. Alternatively, this could be exterior to the patient. Clinical correlation is recommended. 3. Patchy ill-defined opacities and areas of interstitial prominence in the  right mid to lower lung which could reflect developing aspiration pneumonitis. Attention on follow-up studies is recommended. Critical Value/emergent results were called by telephone at the time of interpretation on 01/29/2021 at 12:15 pm to provider nurse Georgiana Spinner for Dr. Cheri Fowler, who verbally acknowledged these results. Electronically Signed   By: Trudie Reed M.D.   On: 01/29/2021 12:18   DG CHEST PORT 1 VIEW  Result Date: 01/28/2021 CLINICAL DATA:  Central line placement. EXAM: PORTABLE CHEST 1 VIEW COMPARISON:  January 28, 2021. FINDINGS: Stable cardiomediastinal silhouette. Endotracheal and nasogastric tubes are unchanged in position. Interval placement of right internal jugular catheter is noted with distal tip in expected position of SVC. Interval placement of left subclavian catheter with distal tip in expected position of SVC. No pneumothorax is noted. Left lung is clear. Mild right basilar atelectasis or infiltrate is noted. Bony thorax is unremarkable. IMPRESSION: Interval placement of bilateral internal jugular catheters with tips in SVC. No pneumothorax is noted. Increased right basilar opacity is noted concerning for pneumonia or atelectasis. Electronically Signed   By: Lupita Raider M.D.   On: 01/28/2021 16:17   DG Chest Portable 1 View  Result Date: 01/28/2021 CLINICAL DATA:  Check endotracheal tube placement EXAM: PORTABLE CHEST 1 VIEW COMPARISON:  None. FINDINGS: Cardiac shadow is within normal limits. Endotracheal tube is noted in satisfactory position. Gastric catheter extends into the distal esophagus. This should be advanced several cm deeper into the stomach. Lungs are well aerated bilaterally. Fullness in the right paratracheal region and hila bilaterally is noted  consistent with lymphadenopathy. No focal infiltrate or effusion is noted. IMPRESSION: Tubes and lines as described above. Gastric catheter should be advanced several cm deeper into the stomach. Changes consistent with lymphadenopathy in the right paratracheal region and bilateral hila. This may represent underlying sarcoidosis although CT of the chest with contrast is recommended for further evaluation. Electronically Signed   By: Alcide Clever M.D.   On: 01/28/2021 12:07   EEG adult  Result Date: 01/28/2021 Charlsie Quest, MD     01/28/2021  4:37 PM Patient Name: Howard Rowland MRN: [id number] Epilepsy Attending: Charlsie Quest Referring Physician/Provider: Hart Rochester, NP Date: 01/28/2021 Duration: 22.21 mins Patient history: 30yo M with ams. EEG to evaluate for seizure Level of alertness: comatose AEDs during EEG study: LEV, versed Technical aspects: This EEG study was done with scalp electrodes positioned according to the 10-20 International system of electrode placement. Electrical activity was acquired at a sampling rate of  and reviewed with a high frequency filter of  and a low frequency filter of . EEG data were recorded continuously and digitally stored. Description: EEG showed continuous generalized 3 to 6 Hz theta-delta slowing admixed an excessive amount of 15 to 18 Hz beta activity with irregular morphology distributed symmetrically and diffusely.  Hyperventilation and photic stimulation were not performed.   ABNORMALITY - Continuous slow, generalized - Excessive beta, generalized IMPRESSION: This study is suggestive of moderate to severe diffuse encephalopathy, nonspecific etiology but likely related to sedation. The excessive beta activity seen in the background is most likely due to the effect of benzodiazepine and is a benign EEG pattern. No seizures or epileptiform discharges were seen throughout the recording. Priyanka Annabelle Harman   Overnight EEG with video  Result Date:  01/29/2021 Charlsie Quest, MD     01/30/2021  8:32 AM Patient Name: Howard Rowland MRN: [id number] Epilepsy Attending: Charlsie Quest Referring Physician/Provider: Dr Bing Neighbors Duration: 01/28/2021 1421 to  01/29/2021 1421  Patient history: 30yo M with ams. EEG to evaluate for seizure  Level of alertness: lethargic/sedated  AEDs during EEG study: LEV  Technical aspects: This EEG study was done with scalp electrodes positioned according to the 10-20 International system of electrode placement. Electrical activity was acquired at a sampling rate of 500Hz  and reviewed with a high frequency filter of 70Hz  and a low frequency filter of 1Hz . EEG data were recorded continuously and digitally stored.  Description: EEG showed continuous generalized polymorphic sharply contoured 3 to 6 Hz theta-delta slowing. Hyperventilation and photic stimulation were not performed.    ABNORMALITY - Continuous slow, generalized  IMPRESSION: This study is suggestive of moderate diffuse encephalopathy, nonspecific etiology. No seizures or epileptiform discharges were seen throughout the recording.   Charlsie Questriyanka O Yadav   ECHOCARDIOGRAM COMPLETE  Result Date: 01/29/2021    ECHOCARDIOGRAM REPORT   Patient Name:   Howard Rowland Date of Exam: 01/29/2021 Medical Rec #:  [id number]     Height:       68.0 in Accession #:    [id number]    Weight:       157.2 lb Date of Birth:  April 22, 1991     BSA:          1.845 m Patient Age:    30 years      BP:           86/62 mmHg Patient Gender: M             HR:           73 bpm. Exam Location:  Inpatient Procedure: 2D Echo, Cardiac Doppler and Color Doppler Indications:    Elevated troponin  History:        Patient has prior history of Echocardiogram examinations and                 Patient has no prior history of Echocardiogram examinations.                 Signs/Symptoms:Resp. failure. Multi-organ failure, HIV,                 polysubstance abuse, narcotic overdose, Elevated troponin.  Sonographer:    Lavenia AtlasBrooke  Strickland RDCS Referring Phys: [id number] WHITNEY D HARRIS IMPRESSIONS  1. Left ventricular ejection fraction, by estimation, is 40 to 45%. The left ventricle has mildly decreased function. The left ventricle demonstrates regional wall motion abnormalities (see scoring diagram/findings for description). Left ventricular diastolic parameters were normal. There is dyskinesis of the left ventricular, basal inferolateral wall and anterolateral wall.  2. Right ventricular systolic function is mildly reduced. The right ventricular size is normal. There is normal pulmonary artery systolic pressure.  3. The mitral valve is abnormal. Trivial mitral valve regurgitation. No evidence of mitral stenosis.  4. The aortic valve is tricuspid. Aortic valve regurgitation is not visualized. Mild aortic valve sclerosis is present, with no evidence of aortic valve stenosis.  5. The inferior vena cava is normal in size with greater than 50% respiratory variability, suggesting right atrial pressure of 3 mmHg. Comparison(s): No prior Echocardiogram. Conclusion(s)/Recommendation(s): Global mild hypokinesis with focal dyskinesis of the basal lateral wall. There is also thickening of the mitral valve concerning for endocarditis, cannot be fully evaluated on this study. Consider TEE. Findings and recommendations communicated to Dr. Kendrick FriesMcQuaid. FINDINGS  Left Ventricle: Left ventricular ejection fraction, by estimation, is 40 to 45%. The left ventricle has mildly decreased function. The left ventricle demonstrates regional wall motion abnormalities.  The left ventricular internal cavity size was normal in size. There is no left ventricular hypertrophy. Left ventricular diastolic parameters were normal. Right Ventricle: The right ventricular size is normal. Right vetricular wall thickness was not well visualized. Right ventricular systolic function is mildly reduced. There is normal pulmonary artery systolic pressure. The tricuspid regurgitant velocity  is 1.80 m/s, and with an assumed right atrial pressure of 3 mmHg, the estimated right ventricular systolic pressure is 16.0 mmHg. Left Atrium: Left atrial size was normal in size. Right Atrium: Right atrial size was normal in size. Pericardium: There is no evidence of pericardial effusion. Mitral Valve: There is a thickening on the posterior leaflet of the mitral valve that extends into the left atrium. Concerning for endocarditis. The mitral valve is abnormal. There is moderate thickening of the mitral valve leaflet(s). Trivial mitral valve regurgitation. No evidence of mitral valve stenosis. Tricuspid Valve: The tricuspid valve is normal in structure. Tricuspid valve regurgitation is mild . No evidence of tricuspid stenosis. Aortic Valve: The aortic valve is tricuspid. Aortic valve regurgitation is not visualized. Mild aortic valve sclerosis is present, with no evidence of aortic valve stenosis. Pulmonic Valve: The pulmonic valve was not well visualized. Pulmonic valve regurgitation is not visualized. No evidence of pulmonic stenosis. Aorta: The aortic root and ascending aorta are structurally normal, with no evidence of dilitation and the aortic arch was not well visualized. Venous: The inferior vena cava is normal in size with greater than 50% respiratory variability, suggesting right atrial pressure of 3 mmHg. IAS/Shunts: The atrial septum is grossly normal.  LEFT VENTRICLE PLAX 2D LVIDd:         4.00 cm  Diastology LVIDs:         3.00 cm  LV e' medial:    5.44 cm/s LV PW:         1.10 cm  LV E/e' medial:  9.9 LV IVS:        1.00 cm  LV e' lateral:   4.68 cm/s LVOT diam:     2.10 cm  LV E/e' lateral: 11.5 LV SV:         32 LV SV Index:   17 LVOT Area:     3.46 cm  RIGHT VENTRICLE RV Basal diam:  3.00 cm RV S prime:     6.31 cm/s TAPSE (M-mode): 1.1 cm LEFT ATRIUM           Index       RIGHT ATRIUM          Index LA diam:      3.10 cm 1.68 cm/m  RA Area:     9.97 cm LA Vol (A2C): 28.5 ml 15.45 ml/m RA  Volume:   20.50 ml 11.11 ml/m LA Vol (A4C): 22.7 ml 12.30 ml/m  AORTIC VALVE LVOT Vmax:   45.80 cm/s LVOT Vmean:  31.000 cm/s LVOT VTI:    0.092 m  AORTA Ao Root diam: 3.50 cm MITRAL VALVE               TRICUSPID VALVE MV Area (PHT): 4.06 cm    TR Peak grad:   13.0 mmHg MV Decel Time: 187 msec    TR Vmax:        180.00 cm/s MV E velocity: 53.60 cm/s MV A velocity: 31.20 cm/s  SHUNTS MV E/A ratio:  1.72        Systemic VTI:  0.09 m  Systemic Diam: 2.10 cm Jodelle Red MD Electronically signed by Jodelle Red MD Signature Date/Time: 01/29/2021/1:31:35 PM    Final    ECHOCARDIOGRAM LIMITED  Result Date: 02/05/2021    ECHOCARDIOGRAM LIMITED REPORT   Patient Name:   Howard Rowland Date of Exam: 02/05/2021 Medical Rec #:  [id number]     Height:       68.0 in Accession #:    [id number]    Weight:       156.1 lb Date of Birth:  1991-03-05     BSA:          1.839 m Patient Age:    30 years      BP:           135/83 mmHg Patient Gender: M             HR:           76 bpm. Exam Location:  Inpatient Procedure: 2D Echo, Limited Echo and Color Doppler Indications:    endocarditis  History:        Patient has prior history of Echocardiogram examinations, most                 recent 01/29/2021.  Sonographer:    MH Referring Phys: [id number] [facility] IMPRESSIONS  1. Left ventricular ejection fraction, by estimation, is 60 to 65%. The left ventricle has normal function. The left ventricle has no regional wall motion abnormalities. Left ventricular diastolic function could not be evaluated.  2. Right ventricular systolic function is normal. The right ventricular size is normal. Tricuspid regurgitation signal is inadequate for assessing PA pressure.  3. The mitral valve is normal in structure. Trivial mitral valve regurgitation. No evidence of mitral stenosis.  4. The aortic valve is normal in structure. Aortic valve regurgitation is not visualized. No aortic stenosis is present.  5. The  inferior vena cava is normal in size with greater than 50% respiratory variability, suggesting right atrial pressure of 3 mmHg. Conclusion(s)/Recommendation(s): No evidence of valvular vegetations on this transthoracic echocardiogram. Would recommend a transesophageal echocardiogram to exclude infective endocarditis if clinically indicated. FINDINGS  Left Ventricle: Left ventricular ejection fraction, by estimation, is 60 to 65%. The left ventricle has normal function. The left ventricle has no regional wall motion abnormalities. The left ventricular internal cavity size was normal in size. There is  no left ventricular hypertrophy. Left ventricular diastolic function could not be evaluated. Right Ventricle: The right ventricular size is normal. No increase in right ventricular wall thickness. Right ventricular systolic function is normal. Tricuspid regurgitation signal is inadequate for assessing PA pressure. Left Atrium: Left atrial size was normal in size. Right Atrium: Right atrial size was normal in size. Pericardium: There is no evidence of pericardial effusion. Mitral Valve: The mitral valve is normal in structure. Trivial mitral valve regurgitation. No evidence of mitral valve stenosis. Tricuspid Valve: The tricuspid valve is normal in structure. Tricuspid valve regurgitation is not demonstrated. No evidence of tricuspid stenosis. Aortic Valve: The aortic valve is normal in structure. Aortic valve regurgitation is not visualized. No aortic stenosis is present. Pulmonic Valve: The pulmonic valve was normal in structure. Pulmonic valve regurgitation is trivial. No evidence of pulmonic stenosis. Aorta: The aortic root is normal in size and structure. Venous: The inferior vena cava is normal in size with greater than 50% respiratory variability, suggesting right atrial pressure of 3 mmHg. IAS/Shunts: No atrial level shunt detected by color flow Doppler.  LV Volumes (MOD) LV vol  d, MOD A4C: 51.1 ml LV vol s, MOD  A4C: 17.2 ml LV SV MOD A4C:     51.1 ml IVC IVC diam: 1.50 cm Armanda Magic MD Electronically signed by Armanda Magic MD Signature Date/Time: 02/05/2021/9:45:59 AM    Final      Subjective: Examined and bedside he is awake and alert and oriented.  He is much improved since the time of his admission and back to his baseline.  He has been deemed medically stable to be discharged at this time and follow-up with PCP, pulmonary as well as ID in outpatient setting is also told to follow-up with neurology and avoid driving given most constant recommendations.  He is understandable and agreeable with the plan of care.  Discharge Exam: Vitals:   02/08/21 0504 02/08/21 0827  BP: 121/65 (!) 143/84  Pulse: 80 82  Resp: 18 20  Temp: 99.5 F (37.5 C)   SpO2: 99%    Vitals:   02/07/21 2016 02/07/21 2334 02/08/21 0504 02/08/21 0827  BP: 129/77 (!) 132/58 121/65 (!) 143/84  Pulse: 80 78 80 82  Resp: (!) 21 20 18 20   Temp: 98.4 F (36.9 C) 100 F (37.8 C) 99.5 F (37.5 C)   TempSrc: Oral Oral Oral   SpO2: 98% 99% 99%   Weight:      Height:       General: Pt is alert, awake, not in acute distress.  He is awake and alert and oriented x3 Cardiovascular: RRR, S1/S2 +, no rubs, no gallops Respiratory: Mildly diminished bilaterally, no wheezing, no rhonchi; unlabored breathing and not wearing supplemental oxygen via nasal cannula Abdominal: Soft, NT, ND, bowel sounds + Extremities: no edema, no cyanosis  The results of significant diagnostics from this hospitalization (including imaging, microbiology, ancillary and laboratory) are listed below for reference.    Microbiology: Recent Results (from the past 240 hour(s))  Toxoplasma Gondii, PCR     Status: None   Collection Time: 01/30/21  3:21 AM   Specimen: Blood  Result Value Ref Range Status   Toxoplasma Gondii, PCR Negative Negative Final    Comment: (NOTE) No Toxoplasma gondii DNA detected. This test was developed and its performance  characteristics determined by 04/01/21. It has not been cleared or approved by the U.S. Food and Drug Administration. The FDA has determined that such clearance or approval is not necessary. This test is used for clinical purposes. It should not be regarded as investigational or research. Performed At: [facility] [address] [place], [place] [id number] MD Jolene Schimke   MRSA Next Gen by PCR, Nasal     Status: Abnormal   Collection Time: 01/30/21  8:22 AM   Specimen: Nasal Mucosa; Nasal Swab  Result Value Ref Range Status   MRSA by PCR Next Gen DETECTED (A) NOT DETECTED Final    Comment: RESULT CALLED TO, READ BACK BY AND VERIFIED WITH: RN J.GRANDOS AT [phone] ON 01/30/2021 BY T.SAAD. (NOTE) The GeneXpert MRSA Assay (FDA approved for NASAL specimens only), is one component of a comprehensive MRSA colonization surveillance program. It is not intended to diagnose MRSA infection nor to guide or monitor treatment for MRSA infections. Test performance is not FDA approved in patients less than 37 years old. Performed at [facility], [address], [place], [place]     Labs: BNP (last 3 results) No results for input(s): BNP in the last 8760 hours. Basic Metabolic Panel: Recent Labs  Lab 02/04/21 0038 02/05/21  0136 02/06/21 0352 02/07/21 0130 02/08/21 0208  NA 140 138 138 137 138  K 3.9 3.7 3.7 3.7 4.0  CL 109 108 108 106 106  CO2 GLUCOSE 97 91 100* 101* 113*  BUN CREATININE 1.00 0.98 1.08 1.08 1.02  CALCIUM 8.7* 9.0 8.9 9.0 9.3  MG  --   --  1.7 1.9  --   PHOS  --   --  4.2 4.3  --    Liver Function Tests: Recent Labs  Lab 02/04/21 0038 02/05/21 0136 02/06/21 0352 02/07/21 0130 02/08/21 0208  AST 134* 86* 54* 43* 44*  ALT 273* 210* 162* 124* 108*  ALKPHOS 88 89 81 90 91  BILITOT 0.8 1.0 0.8 0.8 0.2*  PROT 6.1* 6.0* 6.2* 5.8* 5.9*  ALBUMIN 2.9* 3.0* 3.0* 3.0* 3.1*   No results  for input(s): LIPASE, AMYLASE in the last 168 hours. No results for input(s): AMMONIA in the last 168 hours. CBC: Recent Labs  Lab 02/03/21 0345 02/04/21 0038 02/05/21 0136 02/06/21 0352 02/07/21 0130  WBC 9.2 8.2 7.4 6.9 7.5  NEUTROABS  --   --   --  3.8 4.0  HGB 11.2* 11.8* 11.0* 11.3* 10.4*  HCT 33.7* 35.6* 33.8* 33.0* 31.7*  MCV 81.0 81.1 81.8 80.7 82.3  PLT 175 239 272 319 334   Cardiac Enzymes: No results for input(s): CKTOTAL, CKMB, CKMBINDEX, TROPONINI in the last 168 hours. BNP: Invalid input(s): POCBNP CBG: Recent Labs  Lab 02/01/21 2306 02/02/21 0308 02/02/21 0707 02/02/21 1224 02/02/21 1639  GLUCAP 70 73 70 97 100*   D-Dimer No results for input(s): DDIMER in the last 72 hours. Hgb A1c No results for input(s): HGBA1C in the last 72 hours. Lipid Profile No results for input(s): CHOL, HDL, LDLCALC, TRIG, CHOLHDL, LDLDIRECT in the last 72 hours. Thyroid function studies No results for input(s): TSH, T4TOTAL, T3FREE, THYROIDAB in the last 72 hours.  Invalid input(s): FREET3 Anemia work up No results for input(s): VITAMINB12, FOLATE, FERRITIN, TIBC, IRON, RETICCTPCT in the last 72 hours. Urinalysis    Component Value Date/Time   COLORURINE AMBER (A) 01/28/2021 1215   APPEARANCEUR CLOUDY (A) 01/28/2021 1215   LABSPEC 1.017 01/28/2021 1215   PHURINE 5.0 01/28/2021 1215   GLUCOSEU NEGATIVE 01/28/2021 1215   HGBUR LARGE (A) 01/28/2021 1215   BILIRUBINUR NEGATIVE 01/28/2021 1215   KETONESUR NEGATIVE 01/28/2021 1215   PROTEINUR 30 (A) 01/28/2021 1215   NITRITE NEGATIVE 01/28/2021 1215   LEUKOCYTESUR NEGATIVE 01/28/2021 1215   Sepsis Labs Invalid input(s): PROCALCITONIN,  WBC,  LACTICIDVEN Microbiology Recent Results (from the past 240 hour(s))  Toxoplasma Gondii, PCR     Status: None   Collection Time: 01/30/21  3:21 AM   Specimen: Blood  Result Value Ref Range Status   Toxoplasma Gondii, PCR Negative Negative Final    Comment: (NOTE) No Toxoplasma  gondii DNA detected. This test was developed and its performance characteristics determined by [employer]. It has not been cleared or approved by the U.S. Food and Drug Administration. The FDA has determined that such clearance or approval is not necessary. This test is used for clinical purposes. It should not be regarded as investigational or research. Performed At: [facility] [address] [place], [place] [id number] Jolene Schimke MD WU:[id number]   MRSA Next Gen by PCR, Nasal     Status: Abnormal   Collection Time: 01/30/21  8:22 AM   Specimen: Nasal Mucosa; Nasal  Swab  Result Value Ref Range Status   MRSA by PCR Next Gen DETECTED (A) NOT DETECTED Final    Comment: RESULT CALLED TO, READ BACK BY AND VERIFIED WITH: RN J.GRANDOS AT [phone] ON 01/30/2021 BY T.SAAD. (NOTE) The GeneXpert MRSA Assay (FDA approved for NASAL specimens only), is one component of a comprehensive MRSA colonization surveillance program. It is not intended to diagnose MRSA infection nor to guide or monitor treatment for MRSA infections. Test performance is not FDA approved in patients less than 15 years old. Performed at [facility], [address], [place], [place] [address]    Time coordinating discharge: 35 minutes  SIGNED:  Merlene Laughter, DO [facility] 02/08/2021, 6:37 PM Pager is on AMION  If 7PM-7AM, please contact night-coverage [email or web address]

## 2021-02-08 NOTE — Progress Notes (Signed)
Occupational Therapy Treatment Patient Details Name: Howard Rowland MRN: [id number] DOB: [birth year] Today's Date: 02/08/2021    History of present illness 30 yo admitted 8/30 after found unresponsive by bystander. Pt with hypotension and seizure activity in ED and intubated. Pt with acute renal failure, rhabdomyolysis and encephalopathy. 8/31 PTX with chest tube 8/31-9/3. 9/1 MRI suggestive of acute toxic leukoencephlalopathy. Extubated 9/2. PMhx: PTSD, depression, HIV, polysubstance abuse   OT comments  Patient has progressed well with his functional abilities.  He is up and moving independently in his room, and needed no assist with self care from a sit/stand level.  However, he has noticeable cognitive impairments.  He is having difficulty with complex thought, problem solving, mental flexibility, and ability to correct obvious errors in thought.  OT recommends outpatient cognitive therapies, possibly through the [place].  OT recommends assist with medication management, bill payment, and he should not drive.  Patient verbalizes an understanding of OT recommendations, but it not known if he will follow through.  He only has a girlfriend, and does not appear to have family support.  Currently, he has no place of residence.  The patient states he is "moving to [place], and is going to work for the [employer]".... unclear of the accuracy.  Physically he has mild unsteadiness, but he will needs supervision and assist for cognition.    Follow Up Recommendations  Other (comment);Supervision - Intermittent (Assist with medications, bill payment and patient should not drive.)    Equipment Recommendations  None recommended by OT    Recommendations for Other Services Other (comment) (outpatient cognitive therapies, possible through the [place])    Precautions / Restrictions Precautions Precautions: Fall Restrictions Weight Bearing Restrictions: No       Mobility Bed Mobility Overal bed mobility: Independent                Patient Response: Flat affect  Transfers Overall transfer level: Modified independent               General transfer comment: mild unsteadiness noted    Balance Overall balance assessment: Mild deficits observed, not formally tested                                         ADL either performed or assessed with clinical judgement   ADL Overall ADL's : At baseline                                       General ADL Comments: patient able to stand at sink and bathe and dress himself.  Decreased safety and unsteadiness noted.     Vision Patient Visual Report: No change from baseline     Perception     Praxis      Cognition Arousal/Alertness: Awake/alert Behavior During Therapy: Flat affect Overall Cognitive Status: Impaired/Different from baseline                   Orientation Level: Disoriented to;Situation Current Attention Level: Selective Memory: Decreased short-term memory Following Commands: Follows one step commands consistently;Follows multi-step commands consistently Safety/Judgement: Decreased awareness of safety;Decreased awareness of deficits Awareness: Emergent Problem Solving: Slow processing;Difficulty sequencing;Requires verbal cues General Comments: difficulty with complex thought and problem solving.  Pertinent Vitals/ Pain       Pain Assessment: No/denies pain Pain Intervention(s): Monitored during session  Home Living    Appears to be homeless                                       Progress Toward Goals  OT Goals(current goals can now be found in the care plan section)  Progress towards OT goals: Progressing toward goals  Acute Rehab OT Goals Patient Stated Goal: To get better OT Goal Formulation: With patient Time For Goal Achievement: 02/16/21 Potential to Achieve Goals: Fair  Plan      Co-evaluation                  AM-PAC OT "6 Clicks" Daily Activity     Outcome Measure   Help from another person eating meals?: None Help from another person taking care of personal grooming?: None Help from another person toileting, which includes using toliet, bedpan, or urinal?: None Help from another person bathing (including washing, rinsing, drying)?: None Help from another person to put on and taking off regular upper body clothing?: None Help from another person to put on and taking off regular lower body clothing?: None 6 Click Score: 24    End of Session    OT Visit Diagnosis: Unsteadiness on feet (R26.81);Other symptoms and signs involving cognitive function   Activity Tolerance Patient tolerated treatment well   Patient Left Other (comment) (with PT for mobility and balance.)   Nurse Communication Other (comment) (decreased cognition)        Time: 8756-4332 OT Time Calculation (min): 18 min  Charges: OT General Charges $OT Visit: 1 Visit OT Treatments $Self Care/Home Management : 8-22 mins  02/08/2021  RP, OTR/L  Acute Rehabilitation Services  Office:  [phone]    Suzanna Obey 02/08/2021, 11:50 AM

## 2021-02-23 ENCOUNTER — Emergency Department (HOSPITAL_COMMUNITY)
Admission: EM | Admit: 2021-02-23 | Discharge: 2021-02-23 | Disposition: A | Discharge status: Home / Self Care | Payer: No Typology Code available for payment source | Attending: Emergency Medicine | Admitting: Emergency Medicine

## 2021-02-23 ENCOUNTER — Other Ambulatory Visit: Payer: Self-pay

## 2021-02-23 ENCOUNTER — Emergency Department (HOSPITAL_COMMUNITY): Payer: No Typology Code available for payment source

## 2021-02-23 DIAGNOSIS — Z21 Asymptomatic human immunodeficiency virus [HIV] infection status: Secondary | ICD-10-CM | POA: Insufficient documentation

## 2021-02-23 DIAGNOSIS — Z79899 Other long term (current) drug therapy: Secondary | ICD-10-CM | POA: Insufficient documentation

## 2021-02-23 DIAGNOSIS — R000 Tachycardia, unspecified: Secondary | ICD-10-CM | POA: Insufficient documentation

## 2021-02-23 DIAGNOSIS — R002 Palpitations: Secondary | ICD-10-CM | POA: Insufficient documentation

## 2021-02-23 DIAGNOSIS — R2231 Localized swelling, mass and lump, right upper limb: Secondary | ICD-10-CM | POA: Insufficient documentation

## 2021-02-23 DIAGNOSIS — S60221A Contusion of right hand, initial encounter: Secondary | ICD-10-CM

## 2021-02-23 DIAGNOSIS — F1410 Cocaine abuse, uncomplicated: Secondary | ICD-10-CM | POA: Insufficient documentation

## 2021-02-23 DIAGNOSIS — F17210 Nicotine dependence, cigarettes, uncomplicated: Secondary | ICD-10-CM | POA: Insufficient documentation

## 2021-02-23 DIAGNOSIS — Y906 Blood alcohol level of 120-199 mg/100 ml: Secondary | ICD-10-CM | POA: Insufficient documentation

## 2021-02-23 DIAGNOSIS — T6591XA Toxic effect of unspecified substance, accidental (unintentional), initial encounter: Secondary | ICD-10-CM

## 2021-02-23 LAB — CBC WITH DIFFERENTIAL/PLATELET
Abs Immature Granulocytes: 0.02 10*3/uL (ref 0.00–0.07)
Basophils Absolute: 0 10*3/uL (ref 0.0–0.1)
Basophils Relative: 1 %
Eosinophils Absolute: 0.1 10*3/uL (ref 0.0–0.5)
Eosinophils Relative: 1 %
HCT: 38.3 % — ABNORMAL LOW (ref 39.0–52.0)
Hemoglobin: 12.8 g/dL — ABNORMAL LOW (ref 13.0–17.0)
Immature Granulocytes: 0 %
Lymphocytes Relative: 21 %
Lymphs Abs: 1.2 10*3/uL (ref 0.7–4.0)
MCH: 27.2 pg (ref 26.0–34.0)
MCHC: 33.4 g/dL (ref 30.0–36.0)
MCV: 81.5 fL (ref 80.0–100.0)
Monocytes Absolute: 0.4 10*3/uL (ref 0.1–1.0)
Monocytes Relative: 7 %
Neutro Abs: 4 10*3/uL (ref 1.7–7.7)
Neutrophils Relative %: 70 %
Platelets: 326 10*3/uL (ref 150–400)
RBC: 4.7 MIL/uL (ref 4.22–5.81)
RDW: 14.4 % (ref 11.5–15.5)
WBC: 5.7 10*3/uL (ref 4.0–10.5)
nRBC: 0 % (ref 0.0–0.2)

## 2021-02-23 LAB — URINALYSIS, ROUTINE W REFLEX MICROSCOPIC
Bilirubin Urine: NEGATIVE
Glucose, UA: NEGATIVE mg/dL
Hgb urine dipstick: NEGATIVE
Ketones, ur: NEGATIVE mg/dL
Leukocytes,Ua: NEGATIVE
Nitrite: NEGATIVE
Protein, ur: NEGATIVE mg/dL
Specific Gravity, Urine: <1.005 — ABNORMAL LOW (ref 1.005–1.030)
pH: 7 (ref 5.0–8.0)

## 2021-02-23 LAB — COMPREHENSIVE METABOLIC PANEL
ALT: 21 U/L (ref 0–44)
AST: 31 U/L (ref 15–41)
Albumin: 4.5 g/dL (ref 3.5–5.0)
Alkaline Phosphatase: 94 U/L (ref 38–126)
Anion gap: 14 (ref 5–15)
BUN: <5 mg/dL — ABNORMAL LOW (ref 6–20)
CO2: 24 mmol/L (ref 22–32)
Calcium: 9.2 mg/dL (ref 8.9–10.3)
Chloride: 102 mmol/L (ref 98–111)
Creatinine, Ser: 0.86 mg/dL (ref 0.61–1.24)
GFR, Estimated: >60 mL/min (ref >60)
Glucose, Bld: 118 mg/dL — ABNORMAL HIGH (ref 70–99)
Potassium: 3.1 mmol/L — ABNORMAL LOW (ref 3.5–5.1)
Sodium: 140 mmol/L (ref 135–145)
Total Bilirubin: 0.9 mg/dL (ref 0.3–1.2)
Total Protein: 7.6 g/dL (ref 6.5–8.1)

## 2021-02-23 LAB — ETHANOL: Alcohol, Ethyl (B): 175 mg/dL — ABNORMAL HIGH (ref <10)

## 2021-02-23 LAB — RAPID URINE DRUG SCREEN, HOSP PERFORMED
Amphetamines: NOT DETECTED
Barbiturates: NOT DETECTED
Benzodiazepines: NOT DETECTED
Cocaine: POSITIVE — AB
Opiates: NOT DETECTED
Tetrahydrocannabinol: NOT DETECTED

## 2021-02-23 LAB — TROPONIN I (HIGH SENSITIVITY): Troponin I (High Sensitivity): 7 ng/L (ref <18)

## 2021-02-23 MED ORDER — SODIUM CHLORIDE 0.9 % IV BOLUS
1000.0000 mL | Freq: Once | INTRAVENOUS | Status: AC
  Administered 2021-02-23: 1000 mL via INTRAVENOUS

## 2021-02-23 MED ORDER — ACETAMINOPHEN 500 MG PO TABS
1000.0000 mg | ORAL_TABLET | Freq: Once | ORAL | Status: AC
  Administered 2021-02-23: 1000 mg via ORAL
  Filled 2021-02-23: qty 2

## 2021-02-23 MED ORDER — CLONIDINE HCL 0.2 MG/24HR TD PTWK
0.2000 mg | MEDICATED_PATCH | TRANSDERMAL | Status: DC
  Administered 2021-02-23: 0.2 mg via TRANSDERMAL
  Filled 2021-02-23: qty 1

## 2021-02-23 NOTE — ED Triage Notes (Signed)
Pt came in with c/o anxiety and R hand swelling and pain. Pt took what he thought was cocaine, and he states that it was laced with something. According to pt and his gf he was on a ventilator admitted to [facility] for same approx 2 weeks ago. Pt current HR 144.
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
## 2021-02-23 NOTE — Discharge Instructions (Addendum)
Take Tylenol 1000 mg every 6 hours as needed for pain.  Ice your hand for 20 minutes every 2 hours while awake for the next 2 days.  Return to the emergency department if symptoms significantly worsen or change.

## 2021-02-24 ENCOUNTER — Other Ambulatory Visit: Payer: Self-pay

## 2021-02-24 ENCOUNTER — Emergency Department (HOSPITAL_COMMUNITY)
Admission: EM | Admit: 2021-02-24 | Discharge: 2021-02-24 | Disposition: A | Discharge status: Home / Self Care | Payer: No Typology Code available for payment source | Attending: Emergency Medicine | Admitting: Emergency Medicine

## 2021-02-24 ENCOUNTER — Encounter (HOSPITAL_COMMUNITY): Payer: Self-pay

## 2021-02-24 DIAGNOSIS — F10920 Alcohol use, unspecified with intoxication, uncomplicated: Secondary | ICD-10-CM | POA: Diagnosis present

## 2021-02-24 DIAGNOSIS — R45851 Suicidal ideations: Secondary | ICD-10-CM | POA: Insufficient documentation

## 2021-02-24 DIAGNOSIS — F1910 Other psychoactive substance abuse, uncomplicated: Secondary | ICD-10-CM

## 2021-02-24 DIAGNOSIS — Y907 Blood alcohol level of 200-239 mg/100 ml: Secondary | ICD-10-CM | POA: Diagnosis not present

## 2021-02-24 DIAGNOSIS — Z21 Asymptomatic human immunodeficiency virus [HIV] infection status: Secondary | ICD-10-CM | POA: Insufficient documentation

## 2021-02-24 DIAGNOSIS — F17210 Nicotine dependence, cigarettes, uncomplicated: Secondary | ICD-10-CM | POA: Insufficient documentation

## 2021-02-24 LAB — URINALYSIS, ROUTINE W REFLEX MICROSCOPIC
Bilirubin Urine: NEGATIVE
Glucose, UA: NEGATIVE mg/dL
Hgb urine dipstick: NEGATIVE
Ketones, ur: NEGATIVE mg/dL
Leukocytes,Ua: NEGATIVE
Nitrite: NEGATIVE
Protein, ur: 30 mg/dL — AB
Specific Gravity, Urine: 1.015 (ref 1.005–1.030)
pH: 6 (ref 5.0–8.0)

## 2021-02-24 LAB — CBC WITH DIFFERENTIAL/PLATELET
Abs Immature Granulocytes: 0.02 10*3/uL (ref 0.00–0.07)
Basophils Absolute: 0 10*3/uL (ref 0.0–0.1)
Basophils Relative: 1 %
Eosinophils Absolute: 0.2 10*3/uL (ref 0.0–0.5)
Eosinophils Relative: 4 %
HCT: 38.1 % — ABNORMAL LOW (ref 39.0–52.0)
Hemoglobin: 12.6 g/dL — ABNORMAL LOW (ref 13.0–17.0)
Immature Granulocytes: 1 %
Lymphocytes Relative: 41 %
Lymphs Abs: 1.8 10*3/uL (ref 0.7–4.0)
MCH: 27 pg (ref 26.0–34.0)
MCHC: 33.1 g/dL (ref 30.0–36.0)
MCV: 81.8 fL (ref 80.0–100.0)
Monocytes Absolute: 0.5 10*3/uL (ref 0.1–1.0)
Monocytes Relative: 11 %
Neutro Abs: 1.8 10*3/uL (ref 1.7–7.7)
Neutrophils Relative %: 42 %
Platelets: 298 10*3/uL (ref 150–400)
RBC: 4.66 MIL/uL (ref 4.22–5.81)
RDW: 14.6 % (ref 11.5–15.5)
WBC: 4.3 10*3/uL (ref 4.0–10.5)
nRBC: 0 % (ref 0.0–0.2)

## 2021-02-24 LAB — RAPID URINE DRUG SCREEN, HOSP PERFORMED
Amphetamines: NOT DETECTED
Barbiturates: NOT DETECTED
Benzodiazepines: NOT DETECTED
Cocaine: POSITIVE — AB
Opiates: NOT DETECTED
Tetrahydrocannabinol: POSITIVE — AB

## 2021-02-24 LAB — BASIC METABOLIC PANEL
Anion gap: 12 (ref 5–15)
BUN: <5 mg/dL — ABNORMAL LOW (ref 6–20)
CO2: 25 mmol/L (ref 22–32)
Calcium: 8.7 mg/dL — ABNORMAL LOW (ref 8.9–10.3)
Chloride: 105 mmol/L (ref 98–111)
Creatinine, Ser: 0.72 mg/dL (ref 0.61–1.24)
GFR, Estimated: >60 mL/min (ref >60)
Glucose, Bld: 119 mg/dL — ABNORMAL HIGH (ref 70–99)
Potassium: 3.2 mmol/L — ABNORMAL LOW (ref 3.5–5.1)
Sodium: 142 mmol/L (ref 135–145)

## 2021-02-24 LAB — ETHANOL: Alcohol, Ethyl (B): 210 mg/dL — ABNORMAL HIGH (ref <10)

## 2021-02-24 NOTE — ED Notes (Signed)
Pt given water, ginger ale, sprite

## 2021-02-24 NOTE — ED Notes (Signed)
Urine requested  ?

## 2021-02-24 NOTE — ED Provider Notes (Signed)
[facility]-EMERGENCY DEPT Provider Note   CSN: [id number] Arrival date & time: 02/24/21  0432     History Chief Complaint  Patient presents with   Suicidal    Howard Rowland is a 30 y.o. male.  Patient is a 30 year old male with past medical history of HIV disease, major depressive disorder, polysubstance abuse, and recent hospitalization for drug overdose with altered mental status and respiratory failure requiring intubation.  He also developed acute renal failure and was on dialysis short-term.  He was discharged just over 2 weeks ago after the above.  I saw this patient last night for feeling weak and generally unwell after taking what he thought was cocaine and believes that it was laced with another substance.  He was also intoxicated on alcohol.  His work-up at that time was unremarkable and he was discharged.  He returns today stating that he is "so suicidal".  He tells me that he is going through a lot of issues with his girlfriend.  He also tells me he has been off of his HIV and psychiatric medications for the past 3 weeks because his girlfriend will not take him to the pharmacy to get the prescriptions filled.       History reviewed. No pertinent past medical history.  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 No pertinent surgical history.     History reviewed. No pertinent family history.  Social History   Tobacco Use   Smoking status: Every Day    Types:  Cigarettes   Smokeless tobacco: Never  Substance Use Topics   Alcohol use: Yes   Drug use: Yes    Types: Cocaine    Home Medications Prior to Admission medications   Medication Sig Start Date End Date Taking? Authorizing Provider  bictegravir-emtricitabine-tenofovir AF (BIKTARVY) 50-200-25 MG TABS tablet Take 1 tablet by mouth daily. Patient not taking: No sig reported 11/28/20   [provider]  cloNIDine (CATAPRES - DOSED IN MG/24 HR) 0.2 mg/24hr patch Place 1 patch (0.2 mg total) onto the skin once a week. Patient not taking: No sig reported 02/08/21   Marguerita Merles Latif, DO  levETIRAcetam (KEPPRA) 1000 MG tablet Take 1 tablet (1,000 mg total) by mouth 2 (two) times daily. Patient not taking: No sig reported 02/08/21   Marguerita Merles Latif, DO  mirtazapine (REMERON) 45 MG tablet Take 45 mg by mouth at bedtime.    [provider]  mupirocin ointment (BACTROBAN) 2 % Place into the nose 2 (two) times daily. Patient not taking: No sig reported 02/08/21   Marguerita Merles Latif, DO  pregabalin (LYRICA) 75 MG capsule Take 75 mg by mouth 2 (two) times daily. Patient not taking: No sig reported    [provider]  tamsulosin (FLOMAX) 0.4 MG CAPS capsule Take 1 capsule (0.4 mg total) by mouth daily. Patient not taking: No sig reported 02/08/21   Marguerita Merles Latif, DO    Allergies    Gabapentin, Haldol [haloperidol], Naloxone, Prednisone, Suboxone [  buprenorphine hcl-naloxone hcl], Lurasidone, and Ziprasidone  Review of Systems   Review of Systems  All other systems reviewed and are negative.  Physical Exam Updated Vital Signs BP (!) 128/105 (BP Location: Left Arm)   Pulse 80   Temp 97.9 F (36.6 C) (Oral)   Resp 18   Ht 5\' 8"  (1.727 m)   Wt 70.3 kg   SpO2 99%   BMI 23.57 kg/m   Physical Exam Vitals and nursing note reviewed.  Constitutional:      General: He is not in acute distress.    Appearance: Normal appearance. He is well-developed. He is not  ill-appearing or diaphoretic.  HENT:     Head: Normocephalic and atraumatic.  Cardiovascular:     Rate and Rhythm: Normal rate and regular rhythm.     Heart sounds: No murmur heard.   No friction rub.  Pulmonary:     Effort: Pulmonary effort is normal. No respiratory distress.     Breath sounds: Normal breath sounds. No wheezing or rales.  Abdominal:     General: Bowel sounds are normal. There is no distension.     Palpations: Abdomen is soft.     Tenderness: There is no abdominal tenderness.  Musculoskeletal:        General: Normal range of motion.     Cervical back: Normal range of motion and neck supple.  Skin:    General: Skin is warm and dry.  Neurological:     Mental Status: He is alert and oriented to person, place, and time.     Coordination: Coordination normal.  Psychiatric:        Attention and Perception: Attention normal.        Mood and Affect: Affect is labile.        Speech: Speech normal.        Behavior: Behavior is cooperative.        Thought Content: Thought content includes suicidal ideation. Thought content does not include homicidal ideation. Thought content includes suicidal plan. Thought content does not include homicidal plan.        Judgment: Judgment is impulsive.    ED Results / Procedures / Treatments   Labs (all labs ordered are listed, but only abnormal results are displayed) Labs Reviewed  BASIC METABOLIC PANEL  CBC WITH DIFFERENTIAL/PLATELET  ETHANOL  URINALYSIS, ROUTINE W REFLEX MICROSCOPIC  RAPID URINE DRUG SCREEN, HOSP PERFORMED    EKG None  Radiology DG Hand Complete Right  Result Date: 02/23/2021 CLINICAL DATA:  Right hand pain and swelling EXAM: RIGHT HAND - COMPLETE 3+ VIEW COMPARISON:  None. FINDINGS: No acute fracture or dislocation. Healed right fifth metacarpal fracture with residual volar angulatory deformity. Resultant fifth metacarpal shortening. 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 Remaining  joint spaces are preserved. There is mild soft tissue swelling dorsal to the third MCP joint. No retained radiopaque foreign body. IMPRESSION: Dorsal soft tissue swelling.  No acute fracture or dislocation. Electronically Signed   By: 02/25/2021 M.D.   On: 02/23/2021 01:49    Procedures Procedures   Medications Ordered in ED Medications - No data to display  ED Course  I have reviewed the triage vital signs and the nursing notes.  Pertinent labs & imaging results that were available during my care of the patient were reviewed by me and considered in my medical decision making (see chart for details).    MDM Rules/Calculators/A&P  Patient presenting with complaints of suicidal ideation as described in  the HPI.  He is intoxicated with a blood alcohol of 210, but is awake, alert, and intact.  TTS consultation has been placed.  Disposition pending TTS recommendations.  Final Clinical Impression(s) / ED Diagnoses Final diagnoses:  None    Rx / DC Orders ED Discharge Orders     None        Geoffery Lyons, MD 02/24/21 [phone]

## 2021-02-24 NOTE — ED Provider Notes (Signed)
Patient presented last night with reports of suicidal ideation while intoxicated.  He attempted to leave today and was brought back to the ED.  I have discussed with the patient last night.  He states that he is no longer suicidal today.  He denies any suicidal intentions or plans.  He states he has not attempted to harm himself in the past.  He states he was seen at the [place] and would like to go there with his girlfriend. Patient appears stable for discharge.   Margarita Grizzle, MD 02/24/21 [phone]
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
## 2021-02-24 NOTE — ED Notes (Signed)
Pt given warm blanket.

## 2021-02-24 NOTE — BH Assessment (Signed)
This Clinical research associate briefly spoke with patient earlier this date to gather information in reference to TTS consult although patient was observed to be drowsy and rendered limited information. Patient denied any S/I, H/I or AVH at that time although was not able to stay awake. This writer went back at 1200 hours to gather additional information to complete assessment although patient had been discharged by EDP. See note from Ray MD. TTS assessment was unable to be completed.

## 2021-02-24 NOTE — ED Notes (Signed)
Pt is unable to stay awake for TTS at this time.

## 2021-02-24 NOTE — ED Notes (Addendum)
Pt changed out into burgundy scrubs. His belongings were placed in one white bag placed in a cabinet on the resus side. He has a urinal at bedside to provide a urine sample.

## 2021-02-24 NOTE — BH Assessment (Signed)
Clinician attempted to engage pt in TTS assessment however pt would not respond to his name being called and continued sleeping. Pt's TTS assessment to be completed once pt is alert, oriented and able to engage.    Redmond Pulling, MS, [facility], [facility] Triage Specialist [phone]

## 2021-03-10 LAB — MISC LABCORP TEST (SEND OUT): Labcorp test code: 9985

## 2022-10-24 IMAGING — MR MR HEAD WO/W CM
6 of 12 series · 24 of 48 positions shown · IV contrast (gadavist)
Comparison: CT head 01/28/2021

CLINICAL DATA: Altered mental status, CNS infection in the setting
of seizure and HIV medication noncompliance

EXAM:
MRI HEAD WITHOUT AND WITH CONTRAST
TECHNIQUE: Multiplanar, multiecho pulse sequences of the brain and surrounding
structures were obtained without and with intravenous contrast.
CONTRAST:  5mL GADAVIST GADOBUTROL 1 MMOL/ML IV SOLN

[Series 2: DWI · axial · 3.0mm · 0.94mm/px · z∈[-95,+41]mm · 7 of 94 slices shown (1 of 2)]
[im 1/94]
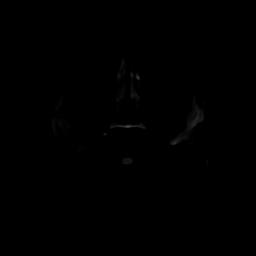
[im 16/94]
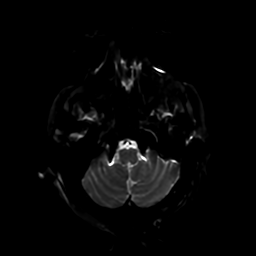
[im 32/94]
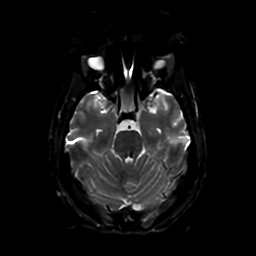
[im 47/94]
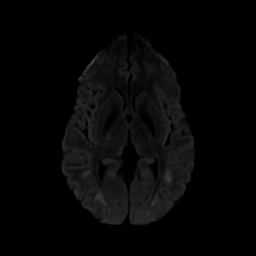
[im 63/94]
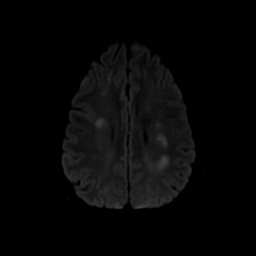
[im 78/94]
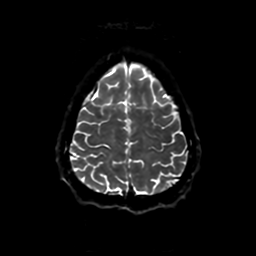
[im 94/94]
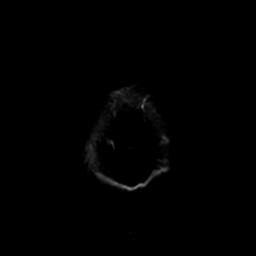

[Series 3: DWI · coronal · 4.0mm · 0.94mm/px · 5 of 74 slices shown (2 of 2)]
[im 1/74]
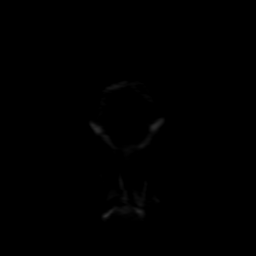
[im 19/74]
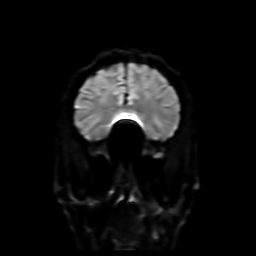
[im 37/74]
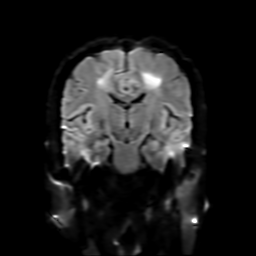
[im 55/74]
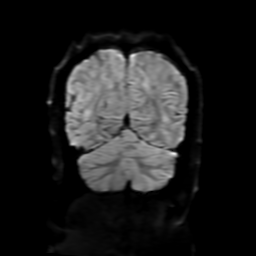
[im 74/74]
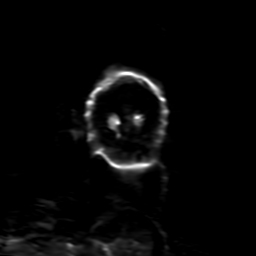

[Series 4: FLAIR · sagittal · 5.0mm · 0.23mm/px · 2 of 23 slices shown (1 of 2)]
[im 1/23]
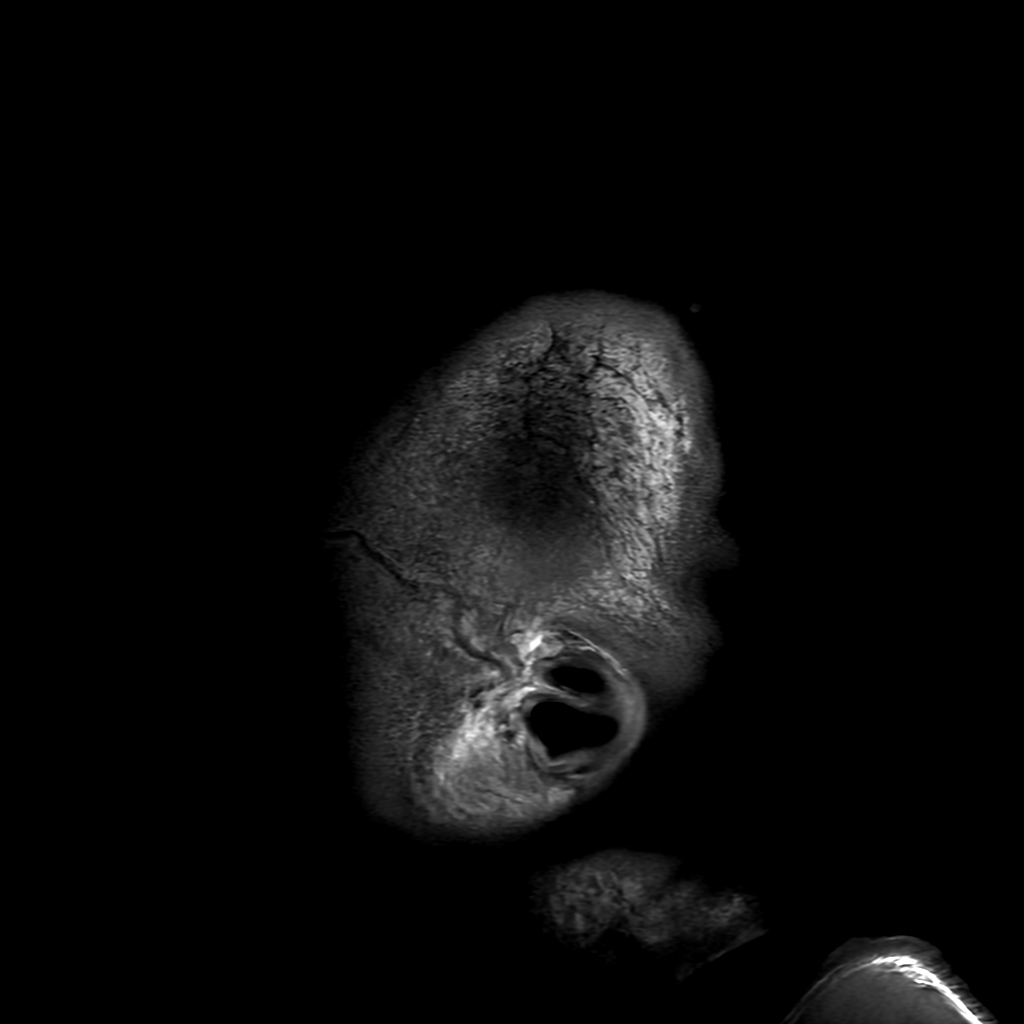
[im 23/23]
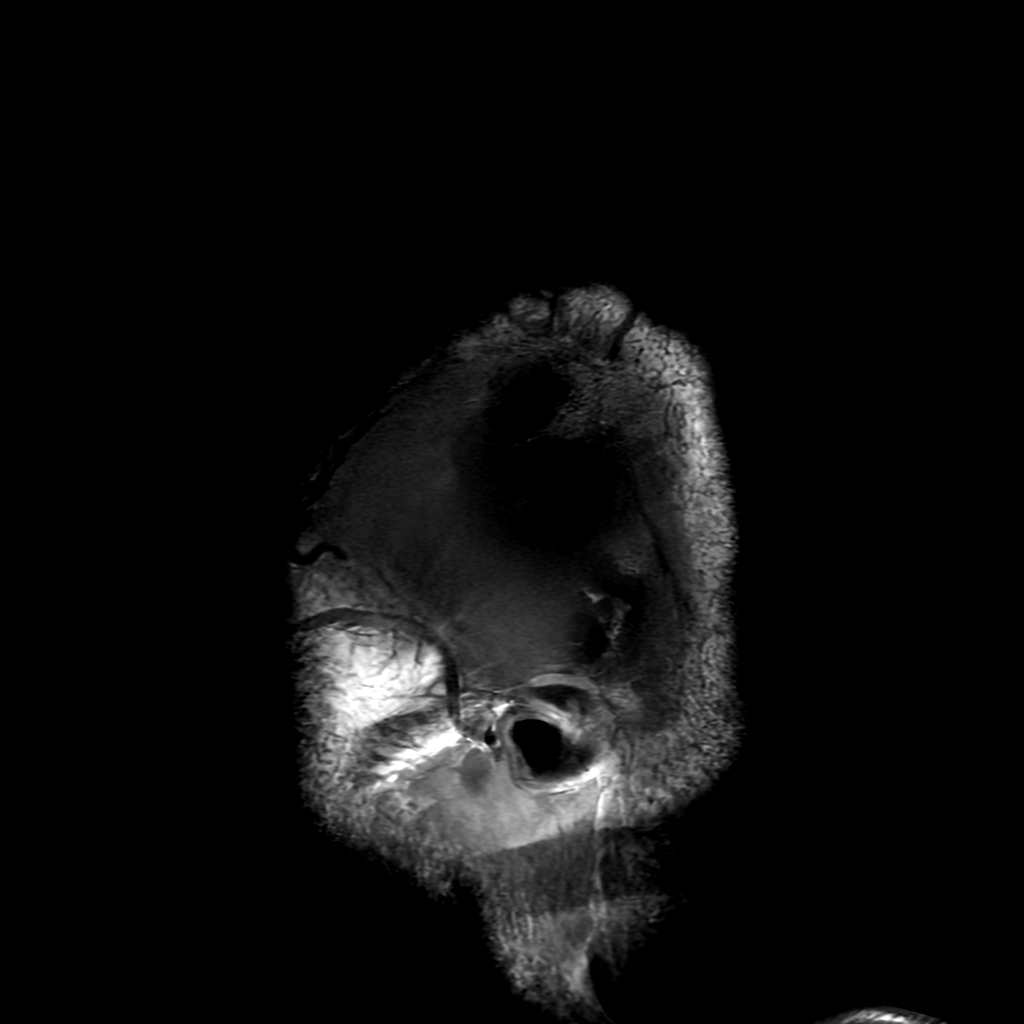

[Series 5: FLAIR · axial · 4.0mm · 0.45mm/px · z∈[-89,+41]mm · 3 of 31 slices shown (2 of 2)]
[im 1/31]
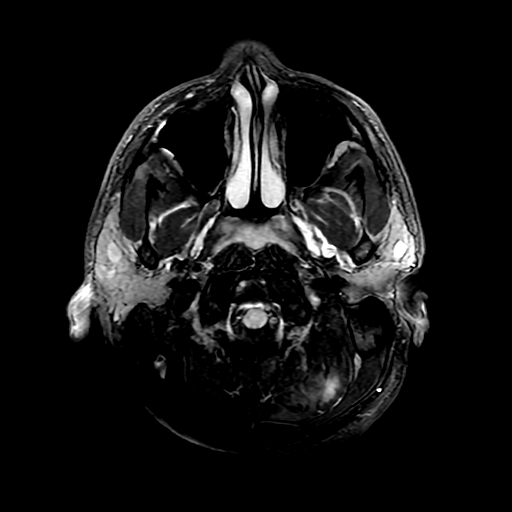
[im 16/31]
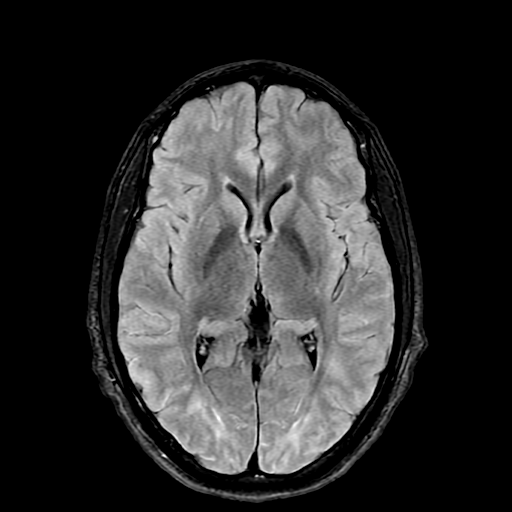
[im 31/31]
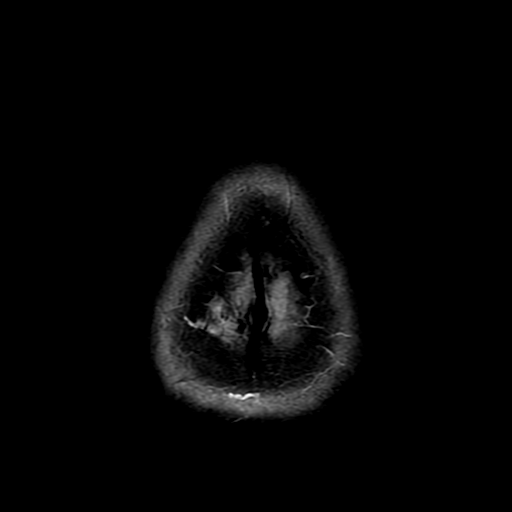

[Series 250: ADC · axial · 3.0mm · 0.94mm/px · z∈[-95,+41]mm · 4 of 47 slices shown (1 of 2)]
[im 1/47]
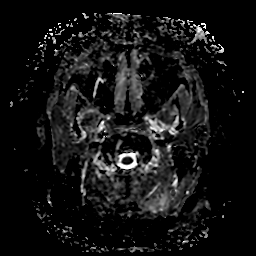
[im 16/47]
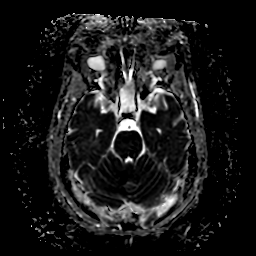
[im 31/47]
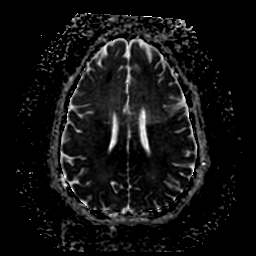
[im 47/47]
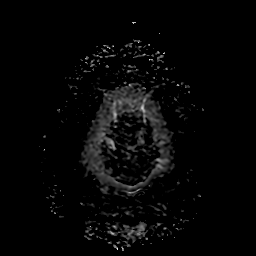

[Series 350: ADC · coronal · 4.0mm · 0.94mm/px · 3 of 36 slices shown (2 of 2)]
[im 1/36]
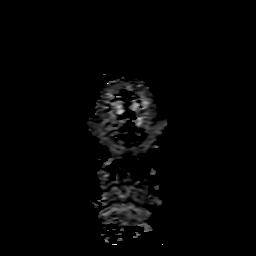
[im 18/36]
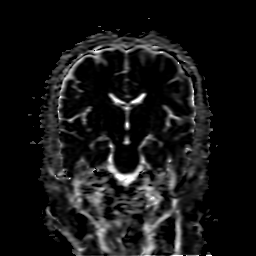
[im 36/36]
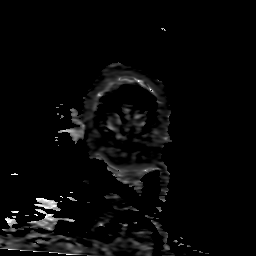

[24 of 48 positions shown; findings below may reference images not displayed]

FINDINGS: Brain: There is confluent FLAIR signal abnormality throughout the
subcortical white matter bilaterally in a symmetric distribution.
The subcortical U fibers are spared. There is associated diffusion
restriction. There is no associated enhancement. There is no
intracranial hemorrhage. There is no mass effect or midline shift.

Vascular: Normal flow voids.

Skull and upper cervical spine: Normal marrow signal.

Sinuses/Orbits: Negative.

Other: None.
IMPRESSION: Extensive symmetric T2/FLAIR signal abnormality throughout the
supratentorial white matter described above with associated
diffusion restriction but no postcontrast enhancement. Findings are
most suggestive of acute toxic leukoencephalopathy, with
differential including HIV encephalitis.
